# Patient Record
Sex: Male | Born: 1955 | Race: White | Hispanic: No | Marital: Married | Smoking: Former smoker
Health system: Southern US, Community
[De-identification: ages and names within clinical notes are randomized; demographics above are authoritative.]

## PROBLEM LIST (undated history)

## (undated) DIAGNOSIS — G43909 Migraine, unspecified, not intractable, without status migrainosus: Secondary | ICD-10-CM

## (undated) DIAGNOSIS — J449 Chronic obstructive pulmonary disease, unspecified: Secondary | ICD-10-CM

## (undated) DIAGNOSIS — R0600 Dyspnea, unspecified: Secondary | ICD-10-CM

## (undated) DIAGNOSIS — K529 Noninfective gastroenteritis and colitis, unspecified: Secondary | ICD-10-CM

## (undated) DIAGNOSIS — K219 Gastro-esophageal reflux disease without esophagitis: Secondary | ICD-10-CM

## (undated) DIAGNOSIS — M1990 Unspecified osteoarthritis, unspecified site: Secondary | ICD-10-CM

## (undated) DIAGNOSIS — R0601 Orthopnea: Secondary | ICD-10-CM

## (undated) DIAGNOSIS — F419 Anxiety disorder, unspecified: Secondary | ICD-10-CM

## (undated) DIAGNOSIS — K5090 Crohn's disease, unspecified, without complications: Secondary | ICD-10-CM

## (undated) DIAGNOSIS — J302 Other seasonal allergic rhinitis: Secondary | ICD-10-CM

## (undated) DIAGNOSIS — G2581 Restless legs syndrome: Secondary | ICD-10-CM

## (undated) DIAGNOSIS — T753XXA Motion sickness, initial encounter: Secondary | ICD-10-CM

## (undated) DIAGNOSIS — I10 Essential (primary) hypertension: Secondary | ICD-10-CM

## (undated) DIAGNOSIS — K5790 Diverticulosis of intestine, part unspecified, without perforation or abscess without bleeding: Secondary | ICD-10-CM

## (undated) DIAGNOSIS — E7800 Pure hypercholesterolemia, unspecified: Secondary | ICD-10-CM

## (undated) HISTORY — DX: Chronic obstructive pulmonary disease, unspecified: J44.9

## (undated) HISTORY — DX: Migraine, unspecified, not intractable, without status migrainosus: G43.909

## (undated) HISTORY — DX: Anxiety disorder, unspecified: F41.9

## (undated) HISTORY — DX: Essential (primary) hypertension: I10

## (undated) HISTORY — DX: Other seasonal allergic rhinitis: J30.2

---
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
## 1982-03-02 HISTORY — PX: OTHER SURGICAL HISTORY: SHX169

## 2000-03-02 HISTORY — PX: JOINT REPLACEMENT: SHX530

## 2001-03-02 HISTORY — PX: REVISION TOTAL HIP ARTHROPLASTY: SHX766

## 2005-02-06 ENCOUNTER — Ambulatory Visit: Payer: Self-pay | Admitting: Unknown Physician Specialty

## 2005-08-21 ENCOUNTER — Ambulatory Visit: Payer: Self-pay | Admitting: Internal Medicine

## 2005-09-29 ENCOUNTER — Ambulatory Visit: Payer: Self-pay | Admitting: Podiatry

## 2005-10-01 ENCOUNTER — Ambulatory Visit: Payer: Self-pay | Admitting: Podiatry

## 2009-03-06 ENCOUNTER — Ambulatory Visit: Payer: Self-pay | Admitting: Unknown Physician Specialty

## 2009-11-16 ENCOUNTER — Ambulatory Visit: Payer: Self-pay | Admitting: Orthopedic Surgery

## 2009-11-19 ENCOUNTER — Encounter: Admission: RE | Admit: 2009-11-19 | Discharge: 2009-11-19 | Payer: Self-pay | Admitting: Orthopedic Surgery

## 2010-01-09 ENCOUNTER — Ambulatory Visit: Payer: Self-pay

## 2010-01-15 ENCOUNTER — Other Ambulatory Visit: Payer: Self-pay | Admitting: Unknown Physician Specialty

## 2010-02-07 ENCOUNTER — Ambulatory Visit: Payer: Self-pay | Admitting: Unknown Physician Specialty

## 2010-02-11 LAB — PATHOLOGY REPORT

## 2014-03-02 HISTORY — PX: ROTATOR CUFF REPAIR: SHX139

## 2014-05-22 ENCOUNTER — Encounter: Payer: Self-pay | Admitting: Pulmonary Disease

## 2014-05-22 ENCOUNTER — Encounter (INDEPENDENT_AMBULATORY_CARE_PROVIDER_SITE_OTHER): Payer: Self-pay

## 2014-05-22 ENCOUNTER — Ambulatory Visit (INDEPENDENT_AMBULATORY_CARE_PROVIDER_SITE_OTHER): Payer: BLUE CROSS/BLUE SHIELD | Admitting: Pulmonary Disease

## 2014-05-22 VITALS — BP 126/74 | HR 70 | Ht 70.0 in | Wt 322.0 lb

## 2014-05-22 DIAGNOSIS — R0602 Shortness of breath: Secondary | ICD-10-CM | POA: Diagnosis not present

## 2014-05-22 DIAGNOSIS — R5381 Other malaise: Secondary | ICD-10-CM

## 2014-05-22 NOTE — Patient Instructions (Signed)
Take the Advair regularly as we demonstrated in clinic no matter how you feel We will arrange a pulmonary function test We will prescribe a humidifier for your oxygen We will see you back in 4 weeks or sooner if needed.

## 2014-05-22 NOTE — Assessment & Plan Note (Signed)
The potential causes for his shortness of breath are myriad.  His lung exam is normal today but he has an extensive smoking history so the likelihood of COPD is quite high. He does seem to get some benefit from albuterol and he has never consistently been able to use a true controller medication for COPD in the past so that may be why he has remained symptomatic for so long.  However, the likelihood of cardiac disease is fairly high considering his obesity, further obesity ended of itself as well as physical deconditioning are the most likely causes of his shortness of breath at this time.  His recent hemoglobin was 19.9, so he is certainly not anemic.  Plan: -We will treat as if this is COPD by adding Advair to be used twice a day, he was given a sample today -We will obtain full pulmonary function testing -If PFTs are abnormal then he may need to have a CT scan of his chest or neuromuscular testing -We will obtain records from his cardiology office visits with stress tests and an echocardiogram -We will obtain records from his prior pulmonologist -Follow-up in 4-6 weeks

## 2014-05-22 NOTE — Progress Notes (Signed)
Subjective:    Patient ID: Ernest Sims, male    DOB: 04-26-1955, 59 y.o.   MRN: [id number]  HPI Chief Complaint  Patient presents with  . Advice Only    Referred by Dr. Doy Hutching for COPD.  Old Dr. Raul Del pt.  Pt c/o worsening sob with any exertion Xfew years.  Pt wears 2.5L 02 qhs   Mr. Ernest Sims is referred to me by his PCP for difficulty breathing.  He says that he can just walk a few feet and then he will start to get out of breath.  He says that he was never really sure of his diagnosis but he has been prescribed inhalers which he said never really helped.    He has had trouble breathing for many years, even in childhood he remembers.  However he tried to stay active as an adult with softball, bowling, golf etc.  Over the last 20 years though he has been more sedentary and his dyspnea has worsened.  He used to smoke 3ppd after 25 years and quit 1990.   He says that when he gets short of breath he wheezes and feels generally very fatigued.  He says he wonders if he is "getting any oxygen" in.  He thinks that the dyspnea is getting worse over the last few years.  He is currently using proAir which doesn't help much, but does help some.  He has taken symbicort twice but his insurance company didn't pay it.  He took a sample of Symbicort for a while that helped a little. He denies chest pain.  He has never been told that he has heart problems.  He had a stress test 6 months ago that was normal.  He had an echocardiogram which he says was normal.  He has undergone pulmonary function testing as recent as yesterday.  He works in a [employer] and has breathing tests every year.    He has nocturnal hypoxemia and uses O2 at night.  He has been told he doesn't have OSA after a polysomnogram. He doesn't sleep with O2 all night due to sinus dryness.  He has noted increasing weakness (generalized) over the yers.    Past Medical History  Diagnosis Date  . Hypertension   . Migraines   . Anxiety   .  Seasonal allergies   . COPD (chronic obstructive pulmonary disease)      Family History  Problem Relation Age of Onset  . Lung disease Father   . Lung disease Mother   . Heart disease Father   . Cancer Brother     liver  . Cancer Maternal Aunt      History   Social History  . Marital Status: Married    Spouse Name: N/A  . Number of Children: N/A  . Years of Education: N/A   Occupational History  . Not on file.   Social History Main Topics  . Smoking status: Former Smoker -- 3.00 packs/day for 24 years    Types: Cigarettes    Quit date: 05/21/1988  . Smokeless tobacco: Never Used  . Alcohol Use: 0.0 oz/week    0 Standard drinks or equivalent per week     Comment: occasional  . Drug Use: Not on file  . Sexual Activity: Not on file   Other Topics Concern  . Not on file   Social History Narrative  . No narrative on file     Allergies  Allergen Reactions  . Penicillins  Childhood allergy     No outpatient prescriptions prior to visit.   No facility-administered medications prior to visit.       Review of Systems  Constitutional: Negative for fever and unexpected weight change.  HENT: Positive for congestion and postnasal drip. Negative for dental problem, ear pain, nosebleeds, rhinorrhea, sinus pressure, sneezing, sore throat and trouble swallowing.   Eyes: Negative for redness and itching.  Respiratory: Positive for cough, shortness of breath and wheezing. Negative for chest tightness.   Cardiovascular: Negative for palpitations and leg swelling.  Gastrointestinal: Negative for nausea and vomiting.  Genitourinary: Negative for dysuria.  Musculoskeletal: Negative for joint swelling.  Skin: Negative for rash.  Neurological: Negative for headaches.  Hematological: Does not bruise/bleed easily.  Psychiatric/Behavioral: Negative for dysphoric mood. The patient is not nervous/anxious.        Objective:   Physical Exam Filed Vitals:   05/22/14 0934    BP: 126/74  Pulse: 70  Height: 5' 10"  (1.778 m)  Weight: 322 lb (146.058 kg)  SpO2: 99%   RA  Ambulated 500 feet on room air and his O2 saturation was normal.  Gen: Morbidly obese , no acute distress HEENT: NCAT, PERRL, EOMi, OP clear, neck supple without masses PULM: CTA B CV: RRR, no mgr, no JVD AB: BS+, soft, nontender, no hsm Ext: warm, no edema, no clubbing, no cyanosis Derm: no rash or skin breakdown Neuro: A&Ox4, CN II-XII intact, strength 5/5 in all 4 extremities   Records from his primary care doctor's office were reviewed, hemoglobin was 18 in 2016     Assessment & Plan:   Shortness of breath The potential causes for his shortness of breath are myriad.  His lung exam is normal today but he has an extensive smoking history so the likelihood of COPD is quite high. He does seem to get some benefit from albuterol and he has never consistently been able to use a true controller medication for COPD in the past so that may be why he has remained symptomatic for so long.  However, the likelihood of cardiac disease is fairly high considering his obesity, further obesity ended of itself as well as physical deconditioning are the most likely causes of his shortness of breath at this time.  His recent hemoglobin was 33.0, so he is certainly not anemic.  Plan: -We will treat as if this is COPD by adding Advair to be used twice a day, he was given a sample today -We will obtain full pulmonary function testing -If PFTs are abnormal then he may need to have a CT scan of his chest or neuromuscular testing -We will obtain records from his cardiology office visits with stress tests and an echocardiogram -We will obtain records from his prior pulmonologist -Follow-up in 4-6 weeks   Morbid obesity If we are unable to find a clear lung disease then the most likely cause for his dyspnea is his morbid obesity and deconditioning. He was encouraged today to try to exercise  regularly.     Updated Medication List Outpatient Encounter Prescriptions as of 05/22/2014  Medication Sig  . butalbital-acetaminophen-caffeine (FIORICET, ESGIC) 50-325-40 MG per tablet Take 1 tablet by mouth every 4 (four) hours as needed for headache.  . diazepam (VALIUM) 10 MG tablet Take 10 mg by mouth every 6 (six) hours as needed for anxiety.  Marland Kitchen olmesartan (BENICAR) 20 MG tablet Take 20 mg by mouth daily.  . pravastatin (PRAVACHOL) 40 MG tablet Take 40 mg by mouth daily.  Marland Kitchen  pseudoephedrine (SUDAFED) 60 MG tablet Take 60 mg by mouth 2 (two) times daily.  Marland Kitchen triamcinolone (NASACORT) 55 MCG/ACT AERO nasal inhaler Place 2 sprays into the nose 2 (two) times daily.  Marland Kitchen zolpidem (AMBIEN) 10 MG tablet Take 10 mg by mouth at bedtime as needed for sleep.

## 2014-05-22 NOTE — Assessment & Plan Note (Signed)
If we are unable to find a clear lung disease then the most likely cause for his dyspnea is his morbid obesity and deconditioning. He was encouraged today to try to exercise regularly.

## 2014-05-31 ENCOUNTER — Telehealth: Payer: Self-pay | Admitting: Pulmonary Disease

## 2014-05-31 MED ORDER — FLUTICASONE-SALMETEROL 250-50 MCG/DOSE IN AEPB: 1.0000 | INHALATION_SPRAY | Freq: Two times a day (BID) | RESPIRATORY_TRACT | Status: DC

## 2014-05-31 NOTE — Telephone Encounter (Signed)
BQ put pt on Advair 250/50 1 puff BID. This rx has been sent in. Pt is aware. Nothing further was needed.

## 2014-06-18 ENCOUNTER — Institutional Professional Consult (permissible substitution): Payer: Self-pay | Admitting: Pulmonary Disease

## 2014-06-21 ENCOUNTER — Ambulatory Visit: Payer: BLUE CROSS/BLUE SHIELD | Admitting: Pulmonary Disease

## 2014-06-21 ENCOUNTER — Telehealth: Payer: Self-pay | Admitting: *Deleted

## 2014-06-21 DIAGNOSIS — R0602 Shortness of breath: Secondary | ICD-10-CM

## 2014-06-21 NOTE — Telephone Encounter (Signed)
PFT order placed

## 2014-07-12 ENCOUNTER — Ambulatory Visit (INDEPENDENT_AMBULATORY_CARE_PROVIDER_SITE_OTHER): Payer: BLUE CROSS/BLUE SHIELD | Admitting: Pulmonary Disease

## 2014-07-12 ENCOUNTER — Encounter: Payer: Self-pay | Admitting: Pulmonary Disease

## 2014-07-12 VITALS — BP 110/70 | HR 78 | Temp 98.0°F | Ht 70.0 in | Wt 321.0 lb

## 2014-07-12 DIAGNOSIS — R0602 Shortness of breath: Secondary | ICD-10-CM

## 2014-07-12 DIAGNOSIS — K219 Gastro-esophageal reflux disease without esophagitis: Secondary | ICD-10-CM | POA: Diagnosis not present

## 2014-07-12 DIAGNOSIS — R490 Dysphonia: Secondary | ICD-10-CM

## 2014-07-12 LAB — PULMONARY FUNCTION TEST
DL/VA % PRED: 74 %
DL/VA: 3.44 ml/min/mmHg/L
DLCO UNC % PRED: 59 %
DLCO UNC: 19.12 ml/min/mmHg
FEF 25-75 PRE: 2.71 L/s
FEF2575-%PRED-PRE: 93 %
FEV1-%Change-Post: -29 %
FEV1-%PRED-PRE: 79 %
FEV1-%Pred-Post: 56 %
FEV1-POST: 2.01 L
FEV1-PRE: 2.84 L
FEV1FVC-%CHANGE-POST: 0 %
FEV1FVC-%PRED-PRE: 105 %
FEV6-%CHANGE-POST: -28 %
FEV6-%PRED-POST: 56 %
FEV6-%Pred-Pre: 79 %
FEV6-Post: 2.54 L
FEV6-Pre: 3.57 L
FEV6FVC-%Change-Post: 0 %
FEV6FVC-%Pred-Post: 105 %
FEV6FVC-%Pred-Pre: 105 %
FVC-%CHANGE-POST: -28 %
FVC-%PRED-POST: 53 %
FVC-%Pred-Pre: 75 %
FVC-POST: 2.54 L
FVC-Pre: 3.58 L
POST FEV1/FVC RATIO: 79 %
POST FEV6/FVC RATIO: 100 %
PRE FEV1/FVC RATIO: 79 %
Pre FEV6/FVC Ratio: 100 %

## 2014-07-12 NOTE — Assessment & Plan Note (Signed)
This is poorly controlled and likely the explanation for his laryngeal symptoms.  Weight loss encourage Acid reflux lifestyle modifications reviewed Continue twice a day omeprazole

## 2014-07-12 NOTE — Progress Notes (Signed)
PFT performed today. 

## 2014-07-12 NOTE — Progress Notes (Signed)
Subjective:    Patient ID: Ernest Sims, male    DOB: 1955/04/26, 59 y.o.   MRN: [id number]  Synopsis: Former smoker who is referred in 2016 for evaluation of shortness of breath. He is morbidly obese. Lung function testing did not show evidence of COPD. However, there was evidence of extrathoracic airflow obstruction as well as moderate restrictive lung disease with a low ERV.  HPI Chief Complaint  Patient presents with  . Follow-up    Pt here for PFT. Pt is still have sob when walking.   Ernest Sims says that the Advair did not help his shortness of breath. He says that he continues to be short of breath with routine activity. He also feels like there is mucus that is hung up in his throat. He tries to cough it out on a regular basis. He says that he continues to have acid reflux on a daily basis. He sleeps sitting upright. He does not follow acid reflux lifestyle modifications. He takes omeprazole twice a day. He really wants to have shoulder surgery.  Past Medical History  Diagnosis Date  . Hypertension   . Migraines   . Anxiety   . Seasonal allergies   . COPD (chronic obstructive pulmonary disease)       Review of Systems  Constitutional: Negative for fever, chills and fatigue.  HENT: Negative for rhinorrhea and sinus pressure.   Respiratory: Positive for shortness of breath. Negative for cough and wheezing.   Cardiovascular: Negative for chest pain, palpitations and leg swelling.       Objective:   Physical Exam Filed Vitals:   07/12/14 1217  BP: 110/70  Pulse: 78  Temp: 98 F (36.7 C)  Height: 5' 10"  (1.778 m)  Weight: 321 lb (145.605 kg)  SpO2: 94%   RA  Gen: morbidly obese but well appearing HENT: OP clear, TM's clear, neck supple PULM: CTA B, normal percussion CV: RRR, no mgr, trace edema GI: BS+, soft, nontender Derm: no cyanosis or rash Psyche: normal mood and affect  Lung function testing from today personally reviewed> there is no evidence of  intrathoracic airflow obstruction (i.e. COPD) but there is moderate restrictive lung disease with a proportionately decreased DLCO the ERV is only 3% predicted      Assessment & Plan:  @REVDATA @  Shortness of breath Ernest Sims does not have COPD as there is no airflow obstruction on the pulmonary function testing performed today. However he has moderate restriction with a markedly low ERV. I explained to him that the differential diagnosis here includes less likely neuromuscular weakness and more likely morbid obesity. Because of his large weight I feel that this is most likely obesity related. However, his volume loop did show some evidence of extrathoracic airflow obstruction which is likely due to mucus pain up on his vocal cords and laryngeal sensitivity from acid reflux. Because of his smoking history he needs to have this further investigated.  Plan: We will get a CT scan of his chest to ensure that there is no evidence of interstitial lung disease that would explain the restrictive lung disease He was encouraged to lose weight From my standpoint if there is no evidence of interstitial lung disease on the CT chest and it would be okay for him to proceed with orthopedic surgery   Acid reflux This is poorly controlled and likely the explanation for his laryngeal symptoms.  Weight loss encourage Acid reflux lifestyle modifications reviewed Continue twice a day omeprazole  Hoarseness Given his smoking history he needs to be evaluated by an otolaryngologist to make sure there is no evidence of a laryngeal cancer. Considering the evidence of extrathoracic airflow obstruction on the pulmonary function testing we will get a CT scan to evaluate the trachea to ensure there is no evidence of a tracheal mass.     Ernest Sims was seen today for follow-up.  Diagnoses and all orders for this visit:  Shortness of breath Orders: -     Ambulatory referral to ENT -     CT Chest High  Resolution; Future  Gastroesophageal reflux disease, esophagitis presence not specified  Hoarseness   @EXTENDED @

## 2014-07-12 NOTE — Patient Instructions (Signed)
We will order a CT scan of your chest to ensure that there is no evidence of lung disease We will refer you to [place] [facility] for evaluation of the hoarseness If there is evidence of a lung problem on the CT scan we will let you know and schedule a follow-up here, otherwise because we cannot find evidence of a lung disease there is no need for further clinic visits with [place] at this time. Stopped taking Advair

## 2014-07-12 NOTE — Assessment & Plan Note (Signed)
Ernest Sims does not have COPD as there is no airflow obstruction on the pulmonary function testing performed today. However he has moderate restriction with a markedly low ERV. I explained to him that the differential diagnosis here includes less likely neuromuscular weakness and more likely morbid obesity. Because of his large weight I feel that this is most likely obesity related. However, his volume loop did show some evidence of extrathoracic airflow obstruction which is likely due to mucus pain up on his vocal cords and laryngeal sensitivity from acid reflux. Because of his smoking history he needs to have this further investigated.  Plan: We will get a CT scan of his chest to ensure that there is no evidence of interstitial lung disease that would explain the restrictive lung disease He was encouraged to lose weight From my standpoint if there is no evidence of interstitial lung disease on the CT chest and it would be okay for him to proceed with orthopedic surgery

## 2014-07-12 NOTE — Assessment & Plan Note (Signed)
Given his smoking history he needs to be evaluated by an otolaryngologist to make sure there is no evidence of a laryngeal cancer. Considering the evidence of extrathoracic airflow obstruction on the pulmonary function testing we will get a CT scan to evaluate the trachea to ensure there is no evidence of a tracheal mass.

## 2014-07-20 ENCOUNTER — Ambulatory Visit
Admission: RE | Admit: 2014-07-20 | Discharge: 2014-07-20 | Disposition: A | Discharge status: Home / Self Care | Payer: BLUE CROSS/BLUE SHIELD | Source: Ambulatory Visit | Attending: Pulmonary Disease | Admitting: Pulmonary Disease

## 2014-07-20 DIAGNOSIS — R0602 Shortness of breath: Secondary | ICD-10-CM | POA: Diagnosis present

## 2014-07-20 DIAGNOSIS — J849 Interstitial pulmonary disease, unspecified: Secondary | ICD-10-CM | POA: Insufficient documentation

## 2014-07-20 DIAGNOSIS — R918 Other nonspecific abnormal finding of lung field: Secondary | ICD-10-CM | POA: Insufficient documentation

## 2014-07-20 DIAGNOSIS — I2510 Atherosclerotic heart disease of native coronary artery without angina pectoris: Secondary | ICD-10-CM | POA: Insufficient documentation

## 2014-07-20 DIAGNOSIS — J432 Centrilobular emphysema: Secondary | ICD-10-CM | POA: Insufficient documentation

## 2014-07-24 ENCOUNTER — Other Ambulatory Visit: Payer: Self-pay | Admitting: Pulmonary Disease

## 2014-07-24 DIAGNOSIS — J849 Interstitial pulmonary disease, unspecified: Secondary | ICD-10-CM

## 2014-07-25 NOTE — Progress Notes (Signed)
Quick Note:  Spoke with pt and notified of results per Dr. McQuaid. Pt verbalized understanding and denied any questions.  ______ 

## 2014-12-05 ENCOUNTER — Emergency Department
Admission: EM | Admit: 2014-12-05 | Discharge: 2014-12-05 | Disposition: A | Discharge status: Home / Self Care | Payer: BLUE CROSS/BLUE SHIELD | Attending: Emergency Medicine | Admitting: Emergency Medicine

## 2014-12-05 DIAGNOSIS — I10 Essential (primary) hypertension: Secondary | ICD-10-CM | POA: Diagnosis not present

## 2014-12-05 DIAGNOSIS — Z87891 Personal history of nicotine dependence: Secondary | ICD-10-CM | POA: Insufficient documentation

## 2014-12-05 DIAGNOSIS — Z7951 Long term (current) use of inhaled steroids: Secondary | ICD-10-CM | POA: Diagnosis not present

## 2014-12-05 DIAGNOSIS — Z79899 Other long term (current) drug therapy: Secondary | ICD-10-CM | POA: Diagnosis not present

## 2014-12-05 DIAGNOSIS — Z79891 Long term (current) use of opiate analgesic: Secondary | ICD-10-CM | POA: Insufficient documentation

## 2014-12-05 DIAGNOSIS — R319 Hematuria, unspecified: Secondary | ICD-10-CM | POA: Diagnosis not present

## 2014-12-05 DIAGNOSIS — Z880 Allergy status to penicillin: Secondary | ICD-10-CM | POA: Diagnosis not present

## 2014-12-05 LAB — CBC
HEMATOCRIT: 40.9 % (ref 40.0–52.0)
Hemoglobin: 13.8 g/dL (ref 13.0–18.0)
MCH: 30.8 pg (ref 26.0–34.0)
MCHC: 33.7 g/dL (ref 32.0–36.0)
MCV: 91.5 fL (ref 80.0–100.0)
Platelets: 206 10*3/uL (ref 150–440)
RBC: 4.47 MIL/uL (ref 4.40–5.90)
RDW: 14.5 % (ref 11.5–14.5)
WBC: 10 10*3/uL (ref 3.8–10.6)

## 2014-12-05 LAB — URINALYSIS COMPLETE WITH MICROSCOPIC (ARMC ONLY)
Bacteria, UA: NONE SEEN
Bilirubin Urine: UNDETERMINED
Hgb urine dipstick: UNDETERMINED
KETONES UR: UNDETERMINED mg/dL
Leukocytes, UA: UNDETERMINED
Nitrite: UNDETERMINED
Protein, ur: UNDETERMINED mg/dL
SPECIFIC GRAVITY, URINE: 1.025 (ref 1.005–1.030)
Squamous Epithelial / LPF: NONE SEEN
WBC, UA: NONE SEEN WBC/hpf (ref 0–5)
pH: UNDETERMINED (ref 5.0–8.0)

## 2014-12-05 LAB — BASIC METABOLIC PANEL
ANION GAP: 9 (ref 5–15)
BUN: 24 mg/dL — ABNORMAL HIGH (ref 6–20)
CALCIUM: 9.2 mg/dL (ref 8.9–10.3)
CHLORIDE: 107 mmol/L (ref 101–111)
CO2: 24 mmol/L (ref 22–32)
CREATININE: 1.06 mg/dL (ref 0.61–1.24)
GFR calc Af Amer: >60 mL/min (ref >60)
GFR calc non Af Amer: >60 mL/min (ref >60)
GLUCOSE: 116 mg/dL — AB (ref 65–99)
Potassium: 4.9 mmol/L (ref 3.5–5.1)
Sodium: 140 mmol/L (ref 135–145)

## 2014-12-05 MED ORDER — OXYCODONE-ACETAMINOPHEN 5-325 MG PO TABS
1.0000 | ORAL_TABLET | Freq: Once | ORAL | Status: AC
  Administered 2014-12-05: 1 via ORAL
  Filled 2014-12-05: qty 1

## 2014-12-05 NOTE — Discharge Instructions (Signed)
Please seek medical attention for any high fevers, chest pain, shortness of breath, change in behavior, persistent vomiting, bloody stool or any other new or concerning symptoms.   Hematuria, Adult Hematuria is blood in your urine. It can be caused by a bladder infection, kidney infection, prostate infection, kidney stone, or cancer of your urinary tract. Infections can usually be treated with medicine, and a kidney stone usually will pass through your urine. If neither of these is the cause of your hematuria, further workup to find out the reason may be needed. It is very important that you tell your health care provider about any blood you see in your urine, even if the blood stops without treatment or happens without causing pain. Blood in your urine that happens and then stops and then happens again can be a symptom of a very serious condition. Also, pain is not a symptom in the initial stages of many urinary cancers. HOME CARE INSTRUCTIONS   Drink lots of fluid, 3-4 quarts a day. If you have been diagnosed with an infection, cranberry juice is especially recommended, in addition to large amounts of water.  Avoid caffeine, tea, and carbonated beverages because they tend to irritate the bladder.  Avoid alcohol because it may irritate the prostate.  Take all medicines as directed by your health care provider.  If you were prescribed an antibiotic medicine, finish it all even if you start to feel better.  If you have been diagnosed with a kidney stone, follow your health care provider's instructions regarding straining your urine to catch the stone.  Empty your bladder often. Avoid holding urine for long periods of time.  After a bowel movement, women should cleanse front to back. Use each tissue only once.  Empty your bladder before and after sexual intercourse if you are a male. SEEK MEDICAL CARE IF:  You develop back pain.  You have a fever.  You have a feeling of sickness in  your stomach (nausea) or vomiting.  Your symptoms are not better in 3 days. Return sooner if you are getting worse. SEEK IMMEDIATE MEDICAL CARE IF:   You develop severe vomiting and are unable to keep the medicine down.  You develop severe back or abdominal pain despite taking your medicines.  You begin passing a large amount of blood or clots in your urine.  You feel extremely weak or faint, or you pass out. MAKE SURE YOU:   Understand these instructions.  Will watch your condition.  Will get help right away if you are not doing well or get worse.   This information is not intended to replace advice given to you by your health care provider. Make sure you discuss any questions you have with your health care provider.   Document Released: 02/16/2005 Document Revised: 03/09/2014 Document Reviewed: 10/17/2012 Elsevier Interactive Patient Education [employer].

## 2014-12-05 NOTE — ED Notes (Signed)
Pt in with co hematuria that started 3 hrs ago, no hx of the same.  States has painful urination.

## 2014-12-05 NOTE — ED Provider Notes (Signed)
[facility] Emergency Department Provider Note    ____________________________________________  Time seen: 0415  I have reviewed the triage vital signs and the nursing notes.   HISTORY  Chief Complaint Hematuria   History limited by: Not Limited   HPI Ernest Sims is a 59 y.o. male who presents to the emergency department today with concern for hematuria. The patient states that he first started noticing blood in his urine roughly 3 hours ago. He did have multiple bloody urinations fairly quickly. He states that the frequency of his bloody urinations have decreased and his last one was roughly 1-1/2 hours prior to my evaluation. The patient states he has had some pain associated with this. Denies any recent fevers. Denies any hematuria in the past.   Past Medical History  Diagnosis Date  . Hypertension   . Migraines   . Anxiety   . Seasonal allergies   . COPD (chronic obstructive pulmonary disease)     Patient Active Problem List   Diagnosis Date Noted  . Acid reflux 07/12/2014  . Hoarseness 07/12/2014  . Shortness of breath 05/22/2014  . Morbid obesity ([place]) 05/22/2014  . Physical deconditioning 05/22/2014    Past Surgical History  Procedure Laterality Date  . Ruptured disc  1984  . Revision total hip arthroplasty  2003    Current Outpatient Rx  Name  Route  Sig  Dispense  Refill  . butalbital-acetaminophen-caffeine (FIORICET, ESGIC) 50-325-40 MG per tablet   Oral   Take 1 tablet by mouth every 4 (four) hours as needed for headache.         . diazepam (VALIUM) 10 MG tablet   Oral   Take 10 mg by mouth every 6 (six) hours as needed for anxiety.         Marland Kitchen olmesartan (BENICAR) 20 MG tablet   Oral   Take 20 mg by mouth daily.         Marland Kitchen omeprazole (PRILOSEC) 40 MG capsule   Oral   Take 40 mg by mouth daily.         Marland Kitchen oxyCODONE (OXY IR/ROXICODONE) 5 MG immediate release tablet   Oral   Take 1-2 tablets by mouth every 4  (four) hours.      0   . pravastatin (PRAVACHOL) 40 MG tablet   Oral   Take 40 mg by mouth daily.         . promethazine (PHENERGAN) 25 MG tablet   Oral   Take 1 tablet by mouth every 6 (six) hours as needed.      0   . propranolol (INDERAL) 40 MG tablet   Oral   Take 40 mg by mouth 2 (two) times daily.         Marland Kitchen triamcinolone (NASACORT) 55 MCG/ACT AERO nasal inhaler   Nasal   Place 2 sprays into the nose 2 (two) times daily as needed.          Marland Kitchen allopurinol (ZYLOPRIM) 300 MG tablet   Oral   Take 600 mg by mouth as needed.          . budesonide-formoterol (SYMBICORT) 160-4.5 MCG/ACT inhaler   Inhalation   Inhale 2 puffs into the lungs 2 (two) times daily.         . pseudoephedrine (SUDAFED) 60 MG tablet   Oral   Take 60 mg by mouth 2 (two) times daily.         Marland Kitchen zolpidem (AMBIEN) 10 MG tablet  Oral   Take 10 mg by mouth at bedtime as needed for sleep.           Allergies Aspirin; Etodolac; Penicillin g; Penicillin v potassium; and Penicillins  Family History  Problem Relation Age of Onset  . Lung disease Father   . Lung disease Mother   . Heart disease Father   . Cancer Brother     liver  . Cancer Maternal Aunt     Social History Social History  Substance Use Topics  . Smoking status: Former Smoker -- 3.00 packs/day for 24 years    Types: Cigarettes    Quit date: 05/21/1988  . Smokeless tobacco: Never Used  . Alcohol Use: 0.0 oz/week    0 Standard drinks or equivalent per week     Comment: occasional    Review of Systems  Constitutional: Negative for fever. Cardiovascular: Negative for chest pain. Respiratory: Negative for shortness of breath. Gastrointestinal: Negative for abdominal pain, vomiting and diarrhea. Genitourinary: Positive for hematuria Musculoskeletal: Negative for back pain.  Skin: Negative for rash. Neurological: Negative for headaches, focal weakness or numbness.  10-point ROS otherwise  negative.  ____________________________________________   PHYSICAL EXAM:  VITAL SIGNS: ED Triage Vitals  Enc Vitals Group     BP 12/05/14 0020 141/58 mmHg     Pulse Rate 12/05/14 0019 100     Resp 12/05/14 0019 18     Temp 12/05/14 0019 98.5 F (36.9 C)     Temp Source 12/05/14 0019 Oral     SpO2 12/05/14 0019 93 %     Weight 12/05/14 0019 300 lb (136.079 kg)     Height 12/05/14 0019 5' 10"  (1.778 m)     Head Cir --      Peak Flow --      Pain Score 12/05/14 0019 7   Constitutional: Alert and oriented. Well appearing and in no distress. Eyes: Conjunctivae are normal. PERRL. Normal extraocular movements. ENT   Head: Normocephalic and atraumatic.   Nose: No congestion/rhinnorhea.   Mouth/Throat: Mucous membranes are moist.   Neck: No stridor. Hematological/Lymphatic/Immunilogical: No cervical lymphadenopathy. Cardiovascular: Normal rate, regular rhythm.  No murmurs, rubs, or gallops. Respiratory: Normal respiratory effort without tachypnea nor retractions. Breath sounds are clear and equal bilaterally. No wheezes/rales/rhonchi. Gastrointestinal: Soft and nontender. No distention.  Musculoskeletal: Normal range of motion in all extremities. No joint effusions.   Neurologic:  Normal speech and language. No gross focal neurologic deficits are appreciated. Speech is normal.  Skin:  Skin is warm, dry and intact. No rash noted. Psychiatric: Mood and affect are normal. Speech and behavior are normal. Patient exhibits appropriate insight and judgment.  ____________________________________________    LABS (pertinent positives/negatives)  Labs Reviewed  URINALYSIS COMPLETEWITH MICROSCOPIC ([place]) - Abnormal; Notable for the following:    Color, Urine RED (*)    APPearance CLOUDY (*)    All other components within normal limits  BASIC METABOLIC PANEL - Abnormal; Notable for the following:    Glucose, Bld 116 (*)    BUN 24 (*)    All other components within  normal limits  CBC     ____________________________________________   EKG  None  ____________________________________________    RADIOLOGY  None   ____________________________________________   PROCEDURES  Procedure(s) performed: None  Critical Care performed: No  ____________________________________________   INITIAL IMPRESSION / ASSESSMENT AND PLAN / ED COURSE  Pertinent labs & imaging results that were available during my care of the patient were reviewed by me  and considered in my medical decision making (see chart for details).  Patient presents to the emergency department today with concerns for hematuria. Patient afebrile without any leukocytosis or white blood cells in the urine thus I doubt infection. Additionally patient not anemic and vital signs are stable. Will have patient follow-up with urology. Discussed retention precautions.  ____________________________________________   FINAL CLINICAL IMPRESSION(S) / ED DIAGNOSES  Final diagnoses:  Hematuria     Nance Pear, MD 12/05/14 8887

## 2015-02-27 ENCOUNTER — Encounter: Payer: Self-pay | Admitting: *Deleted

## 2015-02-28 ENCOUNTER — Ambulatory Visit: Payer: BLUE CROSS/BLUE SHIELD | Admitting: Anesthesiology

## 2015-02-28 ENCOUNTER — Encounter
Admission: RE | Disposition: A | Discharge status: Home / Self Care | Payer: Self-pay | Source: Ambulatory Visit | Attending: Unknown Physician Specialty

## 2015-02-28 ENCOUNTER — Ambulatory Visit
Admission: RE | Admit: 2015-02-28 | Discharge: 2015-02-28 | Disposition: A | Discharge status: Home / Self Care | Payer: BLUE CROSS/BLUE SHIELD | Source: Ambulatory Visit | Attending: Unknown Physician Specialty | Admitting: Unknown Physician Specialty

## 2015-02-28 ENCOUNTER — Encounter: Payer: Self-pay | Admitting: Anesthesiology

## 2015-02-28 DIAGNOSIS — M1711 Unilateral primary osteoarthritis, right knee: Secondary | ICD-10-CM | POA: Diagnosis not present

## 2015-02-28 DIAGNOSIS — Z886 Allergy status to analgesic agent status: Secondary | ICD-10-CM | POA: Diagnosis not present

## 2015-02-28 DIAGNOSIS — R0602 Shortness of breath: Secondary | ICD-10-CM | POA: Insufficient documentation

## 2015-02-28 DIAGNOSIS — K529 Noninfective gastroenteritis and colitis, unspecified: Secondary | ICD-10-CM | POA: Diagnosis not present

## 2015-02-28 DIAGNOSIS — F419 Anxiety disorder, unspecified: Secondary | ICD-10-CM | POA: Diagnosis not present

## 2015-02-28 DIAGNOSIS — G4734 Idiopathic sleep related nonobstructive alveolar hypoventilation: Secondary | ICD-10-CM | POA: Insufficient documentation

## 2015-02-28 DIAGNOSIS — Z880 Allergy status to penicillin: Secondary | ICD-10-CM | POA: Diagnosis not present

## 2015-02-28 DIAGNOSIS — E291 Testicular hypofunction: Secondary | ICD-10-CM | POA: Insufficient documentation

## 2015-02-28 DIAGNOSIS — J449 Chronic obstructive pulmonary disease, unspecified: Secondary | ICD-10-CM | POA: Diagnosis not present

## 2015-02-28 DIAGNOSIS — K640 First degree hemorrhoids: Secondary | ICD-10-CM | POA: Insufficient documentation

## 2015-02-28 DIAGNOSIS — I10 Essential (primary) hypertension: Secondary | ICD-10-CM | POA: Insufficient documentation

## 2015-02-28 DIAGNOSIS — Z87891 Personal history of nicotine dependence: Secondary | ICD-10-CM | POA: Diagnosis not present

## 2015-02-28 DIAGNOSIS — D72822 Plasmacytosis: Secondary | ICD-10-CM | POA: Insufficient documentation

## 2015-02-28 DIAGNOSIS — I878 Other specified disorders of veins: Secondary | ICD-10-CM | POA: Diagnosis not present

## 2015-02-28 DIAGNOSIS — E785 Hyperlipidemia, unspecified: Secondary | ICD-10-CM | POA: Insufficient documentation

## 2015-02-28 DIAGNOSIS — F329 Major depressive disorder, single episode, unspecified: Secondary | ICD-10-CM | POA: Diagnosis not present

## 2015-02-28 DIAGNOSIS — Z6841 Body Mass Index (BMI) 40.0 and over, adult: Secondary | ICD-10-CM | POA: Diagnosis not present

## 2015-02-28 DIAGNOSIS — Z833 Family history of diabetes mellitus: Secondary | ICD-10-CM | POA: Diagnosis not present

## 2015-02-28 DIAGNOSIS — G43909 Migraine, unspecified, not intractable, without status migrainosus: Secondary | ICD-10-CM | POA: Insufficient documentation

## 2015-02-28 DIAGNOSIS — Z7951 Long term (current) use of inhaled steroids: Secondary | ICD-10-CM | POA: Diagnosis not present

## 2015-02-28 DIAGNOSIS — K633 Ulcer of intestine: Secondary | ICD-10-CM | POA: Insufficient documentation

## 2015-02-28 DIAGNOSIS — Z96649 Presence of unspecified artificial hip joint: Secondary | ICD-10-CM | POA: Insufficient documentation

## 2015-02-28 DIAGNOSIS — K210 Gastro-esophageal reflux disease with esophagitis: Secondary | ICD-10-CM | POA: Insufficient documentation

## 2015-02-28 DIAGNOSIS — Z79899 Other long term (current) drug therapy: Secondary | ICD-10-CM | POA: Insufficient documentation

## 2015-02-28 DIAGNOSIS — M479 Spondylosis, unspecified: Secondary | ICD-10-CM | POA: Diagnosis not present

## 2015-02-28 DIAGNOSIS — H409 Unspecified glaucoma: Secondary | ICD-10-CM | POA: Diagnosis not present

## 2015-02-28 DIAGNOSIS — M659 Synovitis and tenosynovitis, unspecified: Secondary | ICD-10-CM | POA: Diagnosis not present

## 2015-02-28 DIAGNOSIS — K598 Other specified functional intestinal disorders: Secondary | ICD-10-CM | POA: Insufficient documentation

## 2015-02-28 DIAGNOSIS — Z8249 Family history of ischemic heart disease and other diseases of the circulatory system: Secondary | ICD-10-CM | POA: Insufficient documentation

## 2015-02-28 DIAGNOSIS — E669 Obesity, unspecified: Secondary | ICD-10-CM | POA: Diagnosis not present

## 2015-02-28 DIAGNOSIS — M109 Gout, unspecified: Secondary | ICD-10-CM | POA: Insufficient documentation

## 2015-02-28 DIAGNOSIS — K5090 Crohn's disease, unspecified, without complications: Secondary | ICD-10-CM | POA: Insufficient documentation

## 2015-02-28 HISTORY — PX: COLONOSCOPY WITH PROPOFOL: SHX5780

## 2015-02-28 HISTORY — PX: ESOPHAGOGASTRODUODENOSCOPY (EGD) WITH PROPOFOL: SHX5813

## 2015-02-28 SURGERY — COLONOSCOPY WITH PROPOFOL
Anesthesia: General

## 2015-02-28 MED ORDER — IPRATROPIUM-ALBUTEROL 0.5-2.5 (3) MG/3ML IN SOLN
RESPIRATORY_TRACT | Status: AC
  Filled 2015-02-28: qty 3

## 2015-02-28 MED ORDER — MIDAZOLAM HCL 2 MG/2ML IJ SOLN
INTRAMUSCULAR | Status: DC | PRN
  Administered 2015-02-28: 1 mg via INTRAVENOUS

## 2015-02-28 MED ORDER — SODIUM CHLORIDE 0.9 % IV SOLN
INTRAVENOUS | Status: DC
  Administered 2015-02-28: 1000 mL via INTRAVENOUS

## 2015-02-28 MED ORDER — PROPOFOL 500 MG/50ML IV EMUL
INTRAVENOUS | Status: DC | PRN
  Administered 2015-02-28: 140 ug/kg/min via INTRAVENOUS

## 2015-02-28 MED ORDER — FENTANYL CITRATE (PF) 100 MCG/2ML IJ SOLN
INTRAMUSCULAR | Status: DC | PRN
  Administered 2015-02-28: 50 ug via INTRAVENOUS

## 2015-02-28 MED ORDER — SODIUM CHLORIDE 0.9 % IV SOLN: INTRAVENOUS | Status: DC

## 2015-02-28 MED ORDER — IPRATROPIUM-ALBUTEROL 0.5-2.5 (3) MG/3ML IN SOLN
3.0000 mL | Freq: Once | RESPIRATORY_TRACT | Status: AC
  Administered 2015-02-28: 3 mL via RESPIRATORY_TRACT

## 2015-02-28 MED ORDER — LIDOCAINE HCL (CARDIAC) 20 MG/ML IV SOLN
INTRAVENOUS | Status: DC | PRN
  Administered 2015-02-28: 60 mg via INTRAVENOUS

## 2015-02-28 MED ORDER — EPHEDRINE SULFATE 50 MG/ML IJ SOLN
INTRAMUSCULAR | Status: DC | PRN
  Administered 2015-02-28: 5 mg via INTRAVENOUS

## 2015-02-28 MED ORDER — GLYCOPYRROLATE 0.2 MG/ML IJ SOLN
INTRAMUSCULAR | Status: DC | PRN
  Administered 2015-02-28: 0.1 mg via INTRAVENOUS

## 2015-02-28 NOTE — Anesthesia Procedure Notes (Signed)
Performed by: Vaughan Sine Pre-anesthesia Checklist: Patient identified, Emergency Drugs available, Suction available, Patient being monitored and Timeout performed Patient Re-evaluated:Patient Re-evaluated prior to inductionOxygen Delivery Method: Simple face mask Preoxygenation: Pre-oxygenation with 100% oxygen Intubation Type: IV induction Placement Confirmation: positive ETCO2 and CO2 detector

## 2015-02-28 NOTE — Anesthesia Preprocedure Evaluation (Signed)
Anesthesia Evaluation  Patient identified by MRN, date of birth, ID band Patient awake    Reviewed: Allergy & Precautions, H&P , NPO status , Patient's Chart, lab work & pertinent test results  History of Anesthesia Complications Negative for: history of anesthetic complications  Airway Mallampati: III  TM Distance: >3 FB Neck ROM: limited    Dental no notable dental hx. (+) Teeth Intact   Pulmonary shortness of breath, COPD, former smoker,    Pulmonary exam normal breath sounds clear to auscultation       Cardiovascular Exercise Tolerance: Poor hypertension, (-) angina+ DOE  (-) Past MI Normal cardiovascular exam Rhythm:regular Rate:Normal     Neuro/Psych  Headaches, PSYCHIATRIC DISORDERS Anxiety    GI/Hepatic Neg liver ROS, GERD  Controlled,  Endo/Other  negative endocrine ROS  Renal/GU negative Renal ROS  negative genitourinary   Musculoskeletal   Abdominal   Peds  Hematology negative hematology ROS (+)   Anesthesia Other Findings Past Medical History:   Hypertension                                                 Migraines                                                    Anxiety                                                      Seasonal allergies                                           COPD (chronic obstructive pulmonary disease) (*             Past Surgical History:   ruptured disc                                    1984         REVISION TOTAL HIP ARTHROPLASTY                  2003        BMI    Body Mass Index   45.19 kg/m 2      Reproductive/Obstetrics negative OB ROS                             Anesthesia Physical Anesthesia Plan  ASA: IV  Anesthesia Plan: General   Post-op Pain Management:    Induction:   Airway Management Planned:   Additional Equipment:   Intra-op Plan:   Post-operative Plan:   Informed Consent: I have reviewed the patients  History and Physical, chart, labs and discussed the procedure including the risks, benefits and alternatives for the proposed anesthesia with the patient or authorized representative who has indicated his/her understanding and acceptance.   Dental Advisory Given  Plan Discussed with: Anesthesiologist, CRNA and Surgeon  Anesthesia Plan Comments:         Anesthesia Quick Evaluation

## 2015-02-28 NOTE — Op Note (Signed)
[facility] Gastroenterology Patient Name: Ernest Sims Procedure Date: 02/28/2015 2:28 PM MRN: [id number] Account #: [id number] Date of Birth: [birth year] Admit Type: Outpatient Age: 59 Room: [facility] ENDO ROOM 1 Gender: Male Note Status: Finalized Procedure:         Colonoscopy Indications:       High risk colon cancer surveillance: Crohn's disease large                     intestine Providers:         Manya Silvas, MD Referring MD:      Leonie Douglas. Doy Hutching, MD (Referring MD) Medicines:         Propofol per Anesthesia Complications:     No immediate complications. Procedure:         Pre-Anesthesia Assessment:                    - After reviewing the risks and benefits, the patient was                     deemed in satisfactory condition to undergo the procedure.                    After obtaining informed consent, the colonoscope was                     passed under direct vision. Throughout the procedure, the                     patient's blood pressure, pulse, and oxygen saturations                     were monitored continuously. The Colonoscope was                     introduced through the anus and advanced to the the cecum,                     identified by appendiceal orifice and ileocecal valve. The                     colonoscopy was performed without difficulty. The patient                     tolerated the procedure well. The quality of the bowel                     preparation was good. Findings:      Internal hemorrhoids were found during endoscopy. The hemorrhoids were       medium-sized, large and Grade I (internal hemorrhoids that do not       prolapse).      A few two mm ulcers were found in the distal descending colon. No       bleeding was present. No stigmata of recent bleeding were seen. Biopsies       were taken with a cold forceps for histology.      The terminal ileum appeared normal.      The colon was otherwise normal Impression:         - Internal hemorrhoids.                    - A few ulcers in the distal descending colon. Biopsied.                    -  The examined portion of the ileum was normal. Recommendation:    - Await pathology results. Manya Silvas, MD 02/28/2015 2:59:53 PM This report has been signed electronically. Number of Addenda: 0 Note Initiated On: 02/28/2015 2:28 PM Scope Withdrawal Time: 0 hours 7 minutes 4 seconds  Total Procedure Duration: 0 hours 10 minutes 10 seconds       [facility]

## 2015-02-28 NOTE — Transfer of Care (Signed)
Immediate Anesthesia Transfer of Care Note  Patient: Ernest Sims  Procedure(s) Performed: Procedure(s): COLONOSCOPY WITH PROPOFOL (N/A) ESOPHAGOGASTRODUODENOSCOPY (EGD) WITH PROPOFOL (N/A)  Patient Location: PACU  Anesthesia Type:General  Level of Consciousness: awake, alert , oriented and sedated  Airway & Oxygen Therapy: Patient Spontanous Breathing and Patient connected to face mask oxygen  Post-op Assessment: Report given to RN and Post -op Vital signs reviewed and stable  Post vital signs: Reviewed and stable  Last Vitals:  Filed Vitals:   02/28/15 1357  BP: 128/63  Pulse: 84  Temp: 36.9 C  Resp: 20    Complications: No apparent anesthesia complications

## 2015-02-28 NOTE — H&P (Signed)
Primary Care Physician:  Idelle Crouch, MD Primary Gastroenterologist:  Dr. Vira Agar  Pre-Procedure History & Physical: HPI:  Ernest Sims is a 59 y.o. male is here for an endoscopy and colonoscopy.   Past Medical History  Diagnosis Date  . Hypertension   . Migraines   . Anxiety   . Seasonal allergies   . COPD (chronic obstructive pulmonary disease) [facility])     Past Surgical History  Procedure Laterality Date  . Ruptured disc  1984  . Revision total hip arthroplasty  2003    Prior to Admission medications   Medication Sig Start Date End Date Taking? Authorizing Provider  butalbital-acetaminophen-caffeine (FIORICET, ESGIC) 50-325-40 MG per tablet Take 1 tablet by mouth every 4 (four) hours as needed for headache.   Yes Historical Provider, MD  olmesartan (BENICAR) 20 MG tablet Take 20 mg by mouth daily.   Yes Historical Provider, MD  omeprazole (PRILOSEC) 40 MG capsule Take 40 mg by mouth daily. 05/09/14  Yes Historical Provider, MD  pravastatin (PRAVACHOL) 40 MG tablet Take 40 mg by mouth daily.   Yes Historical Provider, MD  pseudoephedrine (SUDAFED) 60 MG tablet Take 60 mg by mouth 2 (two) times daily.   Yes Historical Provider, MD  allopurinol (ZYLOPRIM) 300 MG tablet Take 600 mg by mouth as needed.     Historical Provider, MD  budesonide-formoterol (SYMBICORT) 160-4.5 MCG/ACT inhaler Inhale 2 puffs into the lungs 2 (two) times daily. 08/18/13   Historical Provider, MD  diazepam (VALIUM) 10 MG tablet Take 10 mg by mouth every 6 (six) hours as needed for anxiety.    Historical Provider, MD  oxyCODONE (OXY IR/ROXICODONE) 5 MG immediate release tablet Take 1-2 tablets by mouth every 4 (four) hours. 11/30/14   Historical Provider, MD  promethazine (PHENERGAN) 25 MG tablet Take 1 tablet by mouth every 6 (six) hours as needed. 11/13/14   Historical Provider, MD  propranolol (INDERAL) 40 MG tablet Take 40 mg by mouth 2 (two) times daily. 11/20/13   Historical Provider, MD   triamcinolone (NASACORT) 55 MCG/ACT AERO nasal inhaler Place 2 sprays into the nose 2 (two) times daily as needed.     Historical Provider, MD  zolpidem (AMBIEN) 10 MG tablet Take 10 mg by mouth at bedtime as needed for sleep.    Historical Provider, MD    Allergies as of 02/07/2015 - Review Complete 12/05/2014  Allergen Reaction Noted  . Aspirin Nausea And Vomiting 07/12/2014  . Etodolac Nausea And Vomiting 07/12/2014  . Penicillin g Other (See Comments) 07/12/2014  . Penicillin v potassium Swelling 07/12/2014  . Penicillins  05/22/2014    Family History  Problem Relation Age of Onset  . Lung disease Father   . Lung disease Mother   . Heart disease Father   . Cancer Brother     liver  . Cancer Maternal Aunt     Social History   Social History  . Marital Status: Married    Spouse Name: N/A  . Number of Children: N/A  . Years of Education: N/A   Occupational History  . Not on file.   Social History Main Topics  . Smoking status: Former Smoker -- 3.00 packs/day for 24 years    Types: Cigarettes    Quit date: 05/21/1988  . Smokeless tobacco: Never Used  . Alcohol Use: 0.0 oz/week    0 Standard drinks or equivalent per week     Comment: occasional  . Drug Use: Not on file  .  Sexual Activity: Not on file   Other Topics Concern  . Not on file   Social History Narrative    Review of Systems: See HPI, otherwise negative ROS  Physical Exam: BP 128/63 mmHg  Pulse 84  Temp(Src) 98.5 F (36.9 C) (Oral)  Resp 20  Ht 5' 10"  (1.778 m)  Wt 142.883 kg (315 lb)  BMI 45.20 kg/m2  SpO2 97% General:   Alert,  pleasant and cooperative in NAD Head:  Normocephalic and atraumatic. Neck:  Supple; no masses or thyromegaly. Lungs:  Clear throughout to auscultation.    Heart:  Regular rate and rhythm. Abdomen:  Soft, nontender and nondistended. Normal bowel sounds, without guarding, and without rebound.  Very obese, overweight. Neurologic:  Alert and  oriented x4;  grossly  normal neurologically.  Impression/Plan: DONTELL MIAN is here for an endoscopy and colonoscopy to be performed for Crohn's disease and GERD  Risks, benefits, limitations, and alternatives regarding  endoscopy and colonoscopy have been reviewed with the patient.  Questions have been answered.  All parties agreeable.   Gaylyn Cheers, MD  02/28/2015, 2:28 PM

## 2015-02-28 NOTE — Op Note (Signed)
[facility] Gastroenterology Patient Name: Ernest Sims Procedure Date: 02/28/2015 2:29 PM MRN: [id number] Account #: [id number] Date of Birth: 1955-12-18 Admit Type: Outpatient Age: 59 Room: [facility] ENDO ROOM 1 Gender: Male Note Status: Finalized Procedure:         Upper GI endoscopy Indications:       Heartburn Providers:         Manya Silvas, MD Referring MD:      Leonie Douglas. Doy Hutching, MD (Referring MD) Medicines:         Propofol per Anesthesia Complications:     No immediate complications. Procedure:         Pre-Anesthesia Assessment:                    - After reviewing the risks and benefits, the patient was                     deemed in satisfactory condition to undergo the procedure.                    After obtaining informed consent, the endoscope was passed                     under direct vision. Throughout the procedure, the                     patient's blood pressure, pulse, and oxygen saturations                     were monitored continuously. The Endoscope was introduced                     through the mouth, and advanced to the duodenal bulb. The                     upper GI endoscopy was accomplished without difficulty.                     The patient tolerated the procedure well. Findings:      LA Grade A (one or more mucosal breaks less than 5 mm, not extending       between tops of 2 mucosal folds) esophagitis with no bleeding was found       40 cm from the incisors. Bx done at [facility].      The stomach was normal.      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 Impression:        - LA Grade A reflux esophagitis. Rule out Barrett's                     esophagus.                    - Normal stomach.                    - Duodenal deformity.                    - No specimens collected. Recommendation:    - Await pathology results. Manya Silvas, MD 02/28/2015 2:47:06 PM This report  has been signed electronically. Number of Addenda: 0 Note Initiated On: 02/28/2015 2:29 PM      [facility]

## 2015-03-01 NOTE — Anesthesia Postprocedure Evaluation (Signed)
Anesthesia Post Note  Patient: Ernest Sims  Procedure(s) Performed: Procedure(s) (LRB): COLONOSCOPY WITH PROPOFOL (N/A) ESOPHAGOGASTRODUODENOSCOPY (EGD) WITH PROPOFOL (N/A)  Patient location during evaluation: Endoscopy Anesthesia Type: General Level of consciousness: awake and alert Pain management: pain level controlled Vital Signs Assessment: post-procedure vital signs reviewed and stable Respiratory status: spontaneous breathing, nonlabored ventilation, respiratory function stable and patient connected to nasal cannula oxygen Cardiovascular status: blood pressure returned to baseline and stable Postop Assessment: no signs of nausea or vomiting Anesthetic complications: no    Last Vitals:  Filed Vitals:   02/28/15 1520 02/28/15 1530  BP: 138/70 122/56  Pulse: 96 89  Temp:    Resp: 18 14    Last Pain: There were no vitals filed for this visit.               Precious Haws Piscitello

## 2015-03-05 ENCOUNTER — Encounter: Payer: Self-pay | Admitting: Unknown Physician Specialty

## 2015-03-05 LAB — SURGICAL PATHOLOGY

## 2015-04-26 ENCOUNTER — Emergency Department: Payer: BLUE CROSS/BLUE SHIELD

## 2015-04-26 ENCOUNTER — Encounter: Payer: Self-pay | Admitting: *Deleted

## 2015-04-26 ENCOUNTER — Inpatient Hospital Stay
Admission: EM | Admit: 2015-04-26 | Discharge: 2015-04-30 | DRG: 554 | Disposition: A | Discharge status: Skilled Nursing Facility | Payer: BLUE CROSS/BLUE SHIELD | Attending: Internal Medicine | Admitting: Internal Medicine

## 2015-04-26 DIAGNOSIS — E785 Hyperlipidemia, unspecified: Secondary | ICD-10-CM | POA: Diagnosis present

## 2015-04-26 DIAGNOSIS — M7122 Synovial cyst of popliteal space [Baker], left knee: Secondary | ICD-10-CM | POA: Diagnosis present

## 2015-04-26 DIAGNOSIS — S83412A Sprain of medial collateral ligament of left knee, initial encounter: Secondary | ICD-10-CM | POA: Diagnosis present

## 2015-04-26 DIAGNOSIS — Z8249 Family history of ischemic heart disease and other diseases of the circulatory system: Secondary | ICD-10-CM

## 2015-04-26 DIAGNOSIS — W1830XA Fall on same level, unspecified, initial encounter: Secondary | ICD-10-CM | POA: Diagnosis present

## 2015-04-26 DIAGNOSIS — J449 Chronic obstructive pulmonary disease, unspecified: Secondary | ICD-10-CM | POA: Diagnosis present

## 2015-04-26 DIAGNOSIS — F419 Anxiety disorder, unspecified: Secondary | ICD-10-CM | POA: Diagnosis present

## 2015-04-26 DIAGNOSIS — Z6841 Body Mass Index (BMI) 40.0 and over, adult: Secondary | ICD-10-CM

## 2015-04-26 DIAGNOSIS — S91115A Laceration without foreign body of left lesser toe(s) without damage to nail, initial encounter: Secondary | ICD-10-CM | POA: Diagnosis present

## 2015-04-26 DIAGNOSIS — G43909 Migraine, unspecified, not intractable, without status migrainosus: Secondary | ICD-10-CM | POA: Diagnosis present

## 2015-04-26 DIAGNOSIS — Z96649 Presence of unspecified artificial hip joint: Secondary | ICD-10-CM | POA: Diagnosis present

## 2015-04-26 DIAGNOSIS — IMO0002 Reserved for concepts with insufficient information to code with codable children: Secondary | ICD-10-CM

## 2015-04-26 DIAGNOSIS — R296 Repeated falls: Secondary | ICD-10-CM | POA: Diagnosis present

## 2015-04-26 DIAGNOSIS — J302 Other seasonal allergic rhinitis: Secondary | ICD-10-CM | POA: Diagnosis present

## 2015-04-26 DIAGNOSIS — M1712 Unilateral primary osteoarthritis, left knee: Secondary | ICD-10-CM | POA: Diagnosis not present

## 2015-04-26 DIAGNOSIS — M66259 Spontaneous rupture of extensor tendons, unspecified thigh: Secondary | ICD-10-CM

## 2015-04-26 DIAGNOSIS — Z79899 Other long term (current) drug therapy: Secondary | ICD-10-CM

## 2015-04-26 DIAGNOSIS — M659 Synovitis and tenosynovitis, unspecified: Secondary | ICD-10-CM | POA: Diagnosis present

## 2015-04-26 DIAGNOSIS — R55 Syncope and collapse: Secondary | ICD-10-CM

## 2015-04-26 DIAGNOSIS — Z800 Family history of malignant neoplasm of digestive organs: Secondary | ICD-10-CM

## 2015-04-26 DIAGNOSIS — I639 Cerebral infarction, unspecified: Secondary | ICD-10-CM | POA: Diagnosis present

## 2015-04-26 DIAGNOSIS — Z23 Encounter for immunization: Secondary | ICD-10-CM

## 2015-04-26 DIAGNOSIS — Z87891 Personal history of nicotine dependence: Secondary | ICD-10-CM

## 2015-04-26 DIAGNOSIS — I10 Essential (primary) hypertension: Secondary | ICD-10-CM | POA: Diagnosis present

## 2015-04-26 DIAGNOSIS — R260 Ataxic gait: Secondary | ICD-10-CM | POA: Diagnosis present

## 2015-04-26 DIAGNOSIS — Y92009 Unspecified place in unspecified non-institutional (private) residence as the place of occurrence of the external cause: Secondary | ICD-10-CM

## 2015-04-26 LAB — TROPONIN I: Troponin I: <0.03 ng/mL (ref <0.031)

## 2015-04-26 LAB — BASIC METABOLIC PANEL
Anion gap: 6 (ref 5–15)
BUN: 29 mg/dL — AB (ref 6–20)
CALCIUM: 8.8 mg/dL — AB (ref 8.9–10.3)
CO2: 22 mmol/L (ref 22–32)
CREATININE: 1.21 mg/dL (ref 0.61–1.24)
Chloride: 112 mmol/L — ABNORMAL HIGH (ref 101–111)
GFR calc Af Amer: >60 mL/min (ref >60)
GFR calc non Af Amer: >60 mL/min (ref >60)
Glucose, Bld: 103 mg/dL — ABNORMAL HIGH (ref 65–99)
Potassium: 4.7 mmol/L (ref 3.5–5.1)
SODIUM: 140 mmol/L (ref 135–145)

## 2015-04-26 LAB — CBC
HEMATOCRIT: 45.9 % (ref 40.0–52.0)
HEMOGLOBIN: 15.2 g/dL (ref 13.0–18.0)
MCH: 30.1 pg (ref 26.0–34.0)
MCHC: 33 g/dL (ref 32.0–36.0)
MCV: 91.2 fL (ref 80.0–100.0)
Platelets: 182 10*3/uL (ref 150–440)
RBC: 5.03 MIL/uL (ref 4.40–5.90)
RDW: 15.7 % — ABNORMAL HIGH (ref 11.5–14.5)
WBC: 7.1 10*3/uL (ref 3.8–10.6)

## 2015-04-26 MED ORDER — PROPRANOLOL HCL 40 MG PO TABS
40.0000 mg | ORAL_TABLET | Freq: Two times a day (BID) | ORAL | Status: DC
  Administered 2015-04-27 – 2015-04-30 (×7): 40 mg via ORAL
  Filled 2015-04-26 (×7): qty 1

## 2015-04-26 MED ORDER — PANTOPRAZOLE SODIUM 40 MG PO TBEC
40.0000 mg | DELAYED_RELEASE_TABLET | Freq: Every day | ORAL | Status: DC
  Administered 2015-04-27 – 2015-04-30 (×4): 40 mg via ORAL
  Filled 2015-04-26 (×4): qty 1

## 2015-04-26 MED ORDER — ZOLPIDEM TARTRATE 5 MG PO TABS
10.0000 mg | ORAL_TABLET | Freq: Every evening | ORAL | Status: DC | PRN
  Administered 2015-04-27 (×2): 10 mg via ORAL
  Filled 2015-04-26 (×2): qty 2

## 2015-04-26 MED ORDER — LATANOPROST 0.005 % OP SOLN
1.0000 [drp] | Freq: Every day | OPHTHALMIC | Status: DC
  Administered 2015-04-27 – 2015-04-30 (×5): 1 [drp] via OPHTHALMIC
  Filled 2015-04-26: qty 2.5

## 2015-04-26 MED ORDER — IPRATROPIUM-ALBUTEROL 0.5-2.5 (3) MG/3ML IN SOLN: 3.0000 mL | RESPIRATORY_TRACT | Status: DC | PRN

## 2015-04-26 MED ORDER — ONDANSETRON HCL 4 MG/2ML IJ SOLN: 4.0000 mg | Freq: Four times a day (QID) | INTRAMUSCULAR | Status: DC | PRN

## 2015-04-26 MED ORDER — MORPHINE SULFATE (PF) 4 MG/ML IV SOLN
4.0000 mg | Freq: Once | INTRAVENOUS | Status: AC
  Administered 2015-04-26: 4 mg via INTRAVENOUS

## 2015-04-26 MED ORDER — BUTALBITAL-APAP-CAFFEINE 50-325-40 MG PO TABS: 1.0000 | ORAL_TABLET | ORAL | Status: DC | PRN

## 2015-04-26 MED ORDER — ASPIRIN EC 81 MG PO TBEC
81.0000 mg | DELAYED_RELEASE_TABLET | Freq: Every day | ORAL | Status: DC
  Administered 2015-04-27 – 2015-04-30 (×4): 81 mg via ORAL
  Filled 2015-04-26 (×4): qty 1

## 2015-04-26 MED ORDER — ACETAMINOPHEN 650 MG RE SUPP: 650.0000 mg | Freq: Four times a day (QID) | RECTAL | Status: DC | PRN

## 2015-04-26 MED ORDER — MORPHINE SULFATE (PF) 4 MG/ML IV SOLN
INTRAVENOUS | Status: AC
  Administered 2015-04-26: 4 mg via INTRAVENOUS
  Filled 2015-04-26: qty 1

## 2015-04-26 MED ORDER — ACETAMINOPHEN 325 MG PO TABS
650.0000 mg | ORAL_TABLET | Freq: Four times a day (QID) | ORAL | Status: DC | PRN
  Administered 2015-04-27: 650 mg via ORAL
  Filled 2015-04-26: qty 2

## 2015-04-26 MED ORDER — SODIUM CHLORIDE 0.9% FLUSH
3.0000 mL | Freq: Two times a day (BID) | INTRAVENOUS | Status: DC
  Administered 2015-04-27 – 2015-04-29 (×5): 3 mL via INTRAVENOUS

## 2015-04-26 MED ORDER — ONDANSETRON HCL 4 MG PO TABS: 4.0000 mg | ORAL_TABLET | Freq: Four times a day (QID) | ORAL | Status: DC | PRN

## 2015-04-26 MED ORDER — POLYETHYLENE GLYCOL 3350 17 G PO PACK: 17.0000 g | PACK | Freq: Every day | ORAL | Status: DC | PRN

## 2015-04-26 MED ORDER — SODIUM CHLORIDE 0.9 % IV BOLUS (SEPSIS)
1000.0000 mL | Freq: Once | INTRAVENOUS | Status: AC
  Administered 2015-04-26: 1000 mL via INTRAVENOUS

## 2015-04-26 MED ORDER — ONDANSETRON HCL 4 MG/2ML IJ SOLN
4.0000 mg | Freq: Once | INTRAMUSCULAR | Status: AC
Start: 2015-04-26 — End: 2015-04-26
  Administered 2015-04-26: 4 mg via INTRAVENOUS

## 2015-04-26 MED ORDER — DIAZEPAM 5 MG PO TABS
10.0000 mg | ORAL_TABLET | Freq: Four times a day (QID) | ORAL | Status: DC | PRN
  Administered 2015-04-27 – 2015-04-28 (×3): 10 mg via ORAL
  Filled 2015-04-26 (×3): qty 2

## 2015-04-26 MED ORDER — LIDOCAINE HCL (PF) 1 % IJ SOLN
INTRAMUSCULAR | Status: AC
  Administered 2015-04-26: 10 mL via INTRADERMAL
  Filled 2015-04-26: qty 10

## 2015-04-26 MED ORDER — ONDANSETRON HCL 4 MG/2ML IJ SOLN
INTRAMUSCULAR | Status: AC
  Administered 2015-04-26: 4 mg via INTRAVENOUS
  Filled 2015-04-26: qty 2

## 2015-04-26 MED ORDER — HYDROMORPHONE HCL 1 MG/ML IJ SOLN
INTRAMUSCULAR | Status: AC
  Administered 2015-04-26: 1 mg
  Filled 2015-04-26: qty 1

## 2015-04-26 MED ORDER — SODIUM CHLORIDE 0.9% FLUSH: 3.0000 mL | INTRAVENOUS | Status: DC | PRN

## 2015-04-26 MED ORDER — SODIUM CHLORIDE 0.9% FLUSH
3.0000 mL | Freq: Two times a day (BID) | INTRAVENOUS | Status: DC
  Administered 2015-04-28 – 2015-04-30 (×3): 3 mL via INTRAVENOUS

## 2015-04-26 MED ORDER — PRAVASTATIN SODIUM 40 MG PO TABS
40.0000 mg | ORAL_TABLET | Freq: Every day | ORAL | Status: DC
  Administered 2015-04-27 – 2015-04-28 (×2): 40 mg via ORAL
  Filled 2015-04-26 (×2): qty 1

## 2015-04-26 MED ORDER — ENOXAPARIN SODIUM 40 MG/0.4ML ~~LOC~~ SOLN
40.0000 mg | Freq: Two times a day (BID) | SUBCUTANEOUS | Status: DC
  Administered 2015-04-27 – 2015-04-30 (×8): 40 mg via SUBCUTANEOUS
  Filled 2015-04-26 (×7): qty 0.4

## 2015-04-26 MED ORDER — TETANUS-DIPHTH-ACELL PERTUSSIS 5-2.5-18.5 LF-MCG/0.5 IM SUSP
0.5000 mL | Freq: Once | INTRAMUSCULAR | Status: AC
  Administered 2015-04-26: 0.5 mL via INTRAMUSCULAR
  Filled 2015-04-26: qty 0.5

## 2015-04-26 MED ORDER — SODIUM CHLORIDE 0.9 % IV SOLN: 250.0000 mL | INTRAVENOUS | Status: DC | PRN

## 2015-04-26 MED ORDER — TIMOLOL MALEATE 0.5 % OP SOLN
1.0000 [drp] | Freq: Two times a day (BID) | OPHTHALMIC | Status: DC
  Administered 2015-04-27 – 2015-04-30 (×8): 1 [drp] via OPHTHALMIC
  Filled 2015-04-26: qty 5

## 2015-04-26 MED ORDER — OXYCODONE HCL 5 MG PO TABS
5.0000 mg | ORAL_TABLET | ORAL | Status: DC | PRN
  Administered 2015-04-27 – 2015-04-30 (×14): 5 mg via ORAL
  Filled 2015-04-26 (×16): qty 1

## 2015-04-26 MED ORDER — LIDOCAINE HCL (PF) 1 % IJ SOLN
10.0000 mL | Freq: Once | INTRAMUSCULAR | Status: AC
  Administered 2015-04-26: 10 mL via INTRADERMAL

## 2015-04-26 NOTE — ED Notes (Signed)
States a syncopal episode for the past 3 mornings when he woke up, states left toe and knee pain, unsure if he hits his head when he passes out, denies any hx of DM, states he just sits there a minute until he wakes up and then feels nromal, pt awake and alert

## 2015-04-26 NOTE — H&P (Signed)
[facility] [place] NAME: Ernest Sims    MR#:  [id number]  DATE OF BIRTH:  [birth year]  DATE OF ADMISSION:  04/26/2015  PRIMARY CARE PHYSICIAN: SPARKS,JEFFREY D, MD   REQUESTING/REFERRING PHYSICIAN: Dr. Kerman Passey  CHIEF COMPLAINT:   Chief Complaint  Patient presents with  . Loss of Consciousness    HISTORY OF PRESENT ILLNESS:  Ernest Sims  is a 60 y.o. male with a known history of hypertension, COPD, morbid obesity presents to the emergency room complaining of 3 days of falls and gait abnormalities. He initially went to urgent care due to having laceration of his left foot toes. He was referred to the emergency room here. Here patient's CT scan of the head showed left temporo-occipital subacute CVA. Patient noticed 3 days back that whenever he gets up and tries to walk he has falls. Has happened 4 times today. Not related to position. Initial concern was for syncope but patient remembers all these episodes and did not black out. He is having to use a cane for 2 days due to his gait abnormalities. No focal weakness or numbness. No dysphagia or change in vision. No history of CVA. Onset was greater than 3 hours.  PAST MEDICAL HISTORY:   Past Medical History  Diagnosis Date  . Hypertension   . Migraines   . Anxiety   . Seasonal allergies   . COPD (chronic obstructive pulmonary disease) ([place])     PAST SURGICAL HISTORY:   Past Surgical History  Procedure Laterality Date  . Ruptured disc  1984  . Revision total hip arthroplasty  2003  . Colonoscopy with propofol N/A 02/28/2015    Procedure: COLONOSCOPY WITH PROPOFOL;  Surgeon: Manya Silvas, MD;  Location: [facility] ENDOSCOPY;  Service: Endoscopy;  Laterality: N/A;  . Esophagogastroduodenoscopy (egd) with propofol N/A 02/28/2015    Procedure: ESOPHAGOGASTRODUODENOSCOPY (EGD) WITH PROPOFOL;  Surgeon: Manya Silvas, MD;  Location: [facility] ENDOSCOPY;  Service: Endoscopy;   Laterality: N/A;    SOCIAL HISTORY:   Social History  Substance Use Topics  . Smoking status: Former Smoker -- 3.00 packs/day for 24 years    Types: Cigarettes    Quit date: 05/21/1988  . Smokeless tobacco: Never Used  . Alcohol Use: 0.0 oz/week    0 Standard drinks or equivalent per week     Comment: occasional    FAMILY HISTORY:   Family History  Problem Relation Age of Onset  . Lung disease Father   . Lung disease Mother   . Heart disease Father   . Cancer Brother     liver  . Cancer Maternal Aunt     DRUG ALLERGIES:   Allergies  Allergen Reactions  . Aspirin Nausea And Vomiting  . Etodolac Nausea And Vomiting  . Penicillins Other (See Comments)    Reaction:  Unknown     REVIEW OF SYSTEMS:   Review of Systems  Constitutional: Positive for malaise/fatigue. Negative for fever, chills and weight loss.  HENT: Negative for hearing loss and nosebleeds.   Eyes: Negative for blurred vision, double vision and pain.  Respiratory: Positive for shortness of breath (chronic). Negative for cough, hemoptysis, sputum production and wheezing.   Cardiovascular: Negative for chest pain, palpitations, orthopnea and leg swelling.  Gastrointestinal: Negative for nausea, vomiting, abdominal pain, diarrhea and constipation.  Genitourinary: Negative for dysuria and hematuria.  Musculoskeletal: Positive for back pain, joint pain and falls. Negative for myalgias.  Skin: Negative for rash.  Neurological:  Positive for dizziness. Negative for tremors, sensory change, speech change, focal weakness, seizures and headaches.  Endo/Heme/Allergies: Does not bruise/bleed easily.  Psychiatric/Behavioral: Negative for depression and memory loss. The patient is not nervous/anxious.     MEDICATIONS AT HOME:   Prior to Admission medications   Medication Sig Start Date End Date Taking? Authorizing Provider  allopurinol (ZYLOPRIM) 300 MG tablet Take 300 mg by mouth as needed.    Yes Historical  Provider, MD  butalbital-acetaminophen-caffeine (FIORICET, ESGIC) 50-325-40 MG per tablet Take 1 tablet by mouth every 4 (four) hours as needed for headache.   Yes Historical Provider, MD  diazepam (VALIUM) 10 MG tablet Take 10 mg by mouth every 6 (six) hours as needed for anxiety.   Yes Historical Provider, MD  Fluticasone-Salmeterol (ADVAIR) 250-50 MCG/DOSE AEPB Inhale 1 puff into the lungs 2 (two) times daily.   Yes Historical Provider, MD  latanoprost (XALATAN) 0.005 % ophthalmic solution Place 1 drop into both eyes at bedtime.   Yes Historical Provider, MD  olmesartan (BENICAR) 20 MG tablet Take 20 mg by mouth daily.   Yes Historical Provider, MD  omeprazole (PRILOSEC) 40 MG capsule Take 40 mg by mouth daily.   Yes Historical Provider, MD  oxyCODONE (OXY IR/ROXICODONE) 5 MG immediate release tablet Take 5-10 tablets by mouth every 4 (four) hours as needed for severe pain.    Yes Historical Provider, MD  pravastatin (PRAVACHOL) 40 MG tablet Take 40 mg by mouth daily.   Yes Historical Provider, MD  propranolol (INDERAL) 40 MG tablet Take 40 mg by mouth 2 (two) times daily. 11/20/13  Yes Historical Provider, MD  pseudoephedrine (SUDAFED) 60 MG tablet Take 60 mg by mouth 2 (two) times daily.   Yes Historical Provider, MD  timolol (TIMOPTIC) 0.5 % ophthalmic solution Place 1 drop into both eyes 2 (two) times daily.   Yes Historical Provider, MD  triamcinolone (NASACORT) 55 MCG/ACT AERO nasal inhaler Place 2 sprays into the nose 2 (two) times daily as needed.    Yes Historical Provider, MD  zolpidem (AMBIEN) 10 MG tablet Take 10 mg by mouth at bedtime as needed for sleep.   Yes Historical Provider, MD     VITAL SIGNS:  Blood pressure 133/71, pulse 81, temperature 97.9 F (36.6 C), temperature source Oral, resp. rate 18, height 5' 10"  (1.778 m), weight 142.883 kg (315 lb), SpO2 98 %.  PHYSICAL EXAMINATION:  Physical Exam  GENERAL:  60 y.o.-year-old patient lying in the bed with no acute distress.   EYES: Pupils equal, round, reactive to light and accommodation. No scleral icterus. Extraocular muscles intact.  HEENT: Head atraumatic, normocephalic. Oropharynx and nasopharynx clear. No oropharyngeal erythema, moist oral mucosa  NECK:  Supple, no jugular venous distention. No thyroid enlargement, no tenderness.  LUNGS: Normal breath sounds bilaterally, no wheezing, rales, rhonchi. No use of accessory muscles of respiration.  CARDIOVASCULAR: S1, S2 normal. No murmurs, rubs, or gallops.  ABDOMEN: Soft, nontender, nondistended. Bowel sounds present. No organomegaly or mass.  EXTREMITIES: No pedal edema, cyanosis, or clubbing. + 2 pedal & radial pulses b/l.  Pain and effusion of left knee. Dressing over her left foot NEUROLOGIC: Cranial nerves II through XII are intact. No focal Motor or sensory deficits appreciated b/l PSYCHIATRIC: The patient is alert and oriented x 3. Good affect.  SKIN: No obvious rash, lesion, or ulcer.   LABORATORY PANEL:   CBC  Recent Labs Lab 04/26/15 1752  WBC 7.1  HGB 15.2  HCT 45.9  PLT 182   ------------------------------------------------------------------------------------------------------------------  Chemistries   Recent Labs Lab 04/26/15 1752  NA 140  K 4.7  CL 112*  CO2 22  GLUCOSE 103*  BUN 29*  CREATININE 1.21  CALCIUM 8.8*   ------------------------------------------------------------------------------------------------------------------  Cardiac Enzymes  Recent Labs Lab 04/26/15 1752  TROPONINI <0.03   ------------------------------------------------------------------------------------------------------------------  RADIOLOGY:  Ct Head Wo Contrast  04/26/2015  CLINICAL DATA:  Syncope upon waking for 3 days. EXAM: CT HEAD WITHOUT CONTRAST TECHNIQUE: Contiguous axial images were obtained from the base of the skull through the vertex without intravenous contrast. COMPARISON:  None. FINDINGS: Ill-defined cortical and subcortical  hypodensity involving the left posterior temporal occipital lobe, concerning for subacute infarct. No intracranial hemorrhage, mass effect, or midline shift. No hydrocephalus. The basilar cisterns are patent. No intracranial fluid collection. Calvarium is intact. There is mucosal thickening of the right greater than left maxillary sinus, right side of sphenoid sinus, left frontal sinus and ethmoid air cells. The mastoid air cells are well aerated. IMPRESSION: Ill-defined cortical and subcortical hypodensity involving the left posterior occipital temporal lobe, concerning for subacute infarct. No significant mass effect or hemorrhage. These results were called by telephone at the time of interpretation on 04/26/2015 at 7:20 pm to Dr. Nance Pear , who verbally acknowledged these results. Electronically Signed   By: Jeb Levering M.D.   On: 04/26/2015 19:21   Dg Knee Complete 4 Views Left  04/26/2015  CLINICAL DATA:  60 year old who has had syncopal episodes each morning for the past 3 days when he awoke. Left knee pain, possibly related to one of the falls. Initial encounter. EXAM: LEFT KNEE - COMPLETE 4+ VIEW COMPARISON:  None. FINDINGS: No evidence of acute fracture or dislocation. Tricompartment joint space narrowing and associated hypertrophic spurring, most severe in the medial compartment. Chondrocalcinosis involving the lateral and medial menisci. Bone mineral density well-preserved. Small joint effusion. Thickening of the patellar tendon below the knee. IMPRESSION: 1. No acute osseous abnormality. 2. 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 3. Small joint effusion. 4. Thickening of the patellar tendon, query tendonitis or injury. Electronically Signed   By: Evangeline Dakin M.D.   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 - Lovenox for DVT prophylaxis. - PT/OT/Speech consult as needed per symptoms  Patient remembers all the episodes he has fallen and is not syncope.  * Hypertension Continue home medications  * DVT prophylaxis with Lovenox   All the records are reviewed and case discussed with ED provider. Management plans discussed with the patient, family and they are in agreement.  CODE STATUS: FULL  TOTAL TIME TAKING CARE OF THIS PATIENT: 40 minutes.   Hillary Bow R M.D on 04/26/2015 at 11:13 PM  Between 7am to 6pm - Pager - [phone]  After 6pm go to [email or web address] - password [email or web address] [facility]  Office  [phone]  CC: Primary care physician; Idelle Crouch, MD  Note: This dictation was prepared with Dragon dictation along with smaller phrase technology. Any transcriptional errors that result from this process are unintentional.

## 2015-04-26 NOTE — ED Provider Notes (Signed)
[facility] Emergency Department Provider Note  Time seen: 7:43 PM  I have reviewed the triage vital signs and the nursing notes.   HISTORY  Chief Complaint Loss of Consciousness    HPI Ernest Sims is a 60 y.o. male with a past medical history of hypertension, migraines, anxiety, COPD who presents the emergency department with 3 syncopal episodes, left fourth and fifth toe lacerations, left knee pain. According to the patient for the past 3 days he has had syncopal episodes each day. States they typically occur when he first stands up. He also states he has been feeling off balance for the past few days. Patient states with these most recent syncopal episode he fell causing left knee pain and cuts to his left fourth and fifth toes. Patient went to the urgent care for the cuts in the knee pain but was referred to the emergency department due to the syncopal episodes. Patient denies any focal weakness or numbness, besides a mild right arm weakness but states that is been present since his rotator cuff surgery 4 months ago. States his pain as a 10/10 in the left knee however he has been able to ambulate although with pain. Patient suffered 2 cuts to the bottoms of his fourth and fifth toes.     Past Medical History  Diagnosis Date  . Hypertension   . Migraines   . Anxiety   . Seasonal allergies   . COPD (chronic obstructive pulmonary disease) [facility])     Patient Active Problem List   Diagnosis Date Noted  . Acid reflux 07/12/2014  . Hoarseness 07/12/2014  . Shortness of breath 05/22/2014  . Morbid obesity ([place]) 05/22/2014  . Physical deconditioning 05/22/2014    Past Surgical History  Procedure Laterality Date  . Ruptured disc  1984  . Revision total hip arthroplasty  2003  . Colonoscopy with propofol N/A 02/28/2015    Procedure: COLONOSCOPY WITH PROPOFOL;  Surgeon: Manya Silvas, MD;  Location: [facility] ENDOSCOPY;  Service: Endoscopy;  Laterality:  N/A;  . Esophagogastroduodenoscopy (egd) with propofol N/A 02/28/2015    Procedure: ESOPHAGOGASTRODUODENOSCOPY (EGD) WITH PROPOFOL;  Surgeon: Manya Silvas, MD;  Location: [facility] ENDOSCOPY;  Service: Endoscopy;  Laterality: N/A;    Current Outpatient Rx  Name  Route  Sig  Dispense  Refill  . allopurinol (ZYLOPRIM) 300 MG tablet   Oral   Take 600 mg by mouth as needed.          . budesonide-formoterol (SYMBICORT) 160-4.5 MCG/ACT inhaler   Inhalation   Inhale 2 puffs into the lungs 2 (two) times daily.         . butalbital-acetaminophen-caffeine (FIORICET, ESGIC) 50-325-40 MG per tablet   Oral   Take 1 tablet by mouth every 4 (four) hours as needed for headache.         . diazepam (VALIUM) 10 MG tablet   Oral   Take 10 mg by mouth every 6 (six) hours as needed for anxiety.         Marland Kitchen olmesartan (BENICAR) 20 MG tablet   Oral   Take 20 mg by mouth daily.         Marland Kitchen omeprazole (PRILOSEC) 40 MG capsule   Oral   Take 40 mg by mouth daily.         Marland Kitchen oxyCODONE (OXY IR/ROXICODONE) 5 MG immediate release tablet   Oral   Take 1-2 tablets by mouth every 4 (four) hours.      0   .  pravastatin (PRAVACHOL) 40 MG tablet   Oral   Take 40 mg by mouth daily.         . promethazine (PHENERGAN) 25 MG tablet   Oral   Take 1 tablet by mouth every 6 (six) hours as needed.      0   . propranolol (INDERAL) 40 MG tablet   Oral   Take 40 mg by mouth 2 (two) times daily.         . pseudoephedrine (SUDAFED) 60 MG tablet   Oral   Take 60 mg by mouth 2 (two) times daily.         Marland Kitchen triamcinolone (NASACORT) 55 MCG/ACT AERO nasal inhaler   Nasal   Place 2 sprays into the nose 2 (two) times daily as needed.          . zolpidem (AMBIEN) 10 MG tablet   Oral   Take 10 mg by mouth at bedtime as needed for sleep.           Allergies Aspirin; Etodolac; Penicillin g; Penicillin v potassium; and Penicillins  Family History  Problem Relation Age of Onset  . Lung disease  Father   . Lung disease Mother   . Heart disease Father   . Cancer Brother     liver  . Cancer Maternal Aunt     Social History Social History  Substance Use Topics  . Smoking status: Former Smoker -- 3.00 packs/day for 24 years    Types: Cigarettes    Quit date: 05/21/1988  . Smokeless tobacco: Never Used  . Alcohol Use: 0.0 oz/week    0 Standard drinks or equivalent per week     Comment: occasional    Review of Systems Constitutional: Negative for fever. Cardiovascular: Negative for chest pain. Respiratory: Negative for shortness of breath. Gastrointestinal: Negative for abdominal pain Neurological: Negative for . Denies focal weakness or numbness. Does state he has been feeling off balance for the past few days. 10-point ROS otherwise negative.  ____________________________________________   PHYSICAL EXAM:  VITAL SIGNS: ED Triage Vitals  Enc Vitals Group     BP 04/26/15 1750 139/97 mmHg     Pulse Rate 04/26/15 1750 76     Resp 04/26/15 1750 22     Temp 04/26/15 1750 97.9 F (36.6 C)     Temp Source 04/26/15 1750 Oral     SpO2 04/26/15 1750 96 %     Weight 04/26/15 1750 315 lb (142.883 kg)     Height 04/26/15 1750 5' 10"  (1.778 m)     Head Cir --      Peak Flow --      Pain Score 04/26/15 1750 10     Pain Loc --      Pain Edu? --      Excl. in [place]? --     Constitutional: Alert and oriented. Well appearing and in no distress. Eyes: Normal exam ENT   Head: Normocephalic and atraumatic.   Mouth/Throat: Mucous membranes are moist. Cardiovascular: Normal rate, regular rhythm. No murmur Respiratory: Normal respiratory effort without tachypnea nor retractions. Breath sounds are clear Gastrointestinal: Soft and nontender. No distention. Musculoskeletal: Moderate tenderness palpation of the left knee, pain with range of motion. Neurovascularly intact distally. 1.5; laceration to the bottom of the left fifth toe, 1.5; laceration to the bottom of the left  fourth toe. Hemostatic. Neurologic:  Normal speech and language. No gross focal neurologic deficits Skin:  Skin is warm, dry, lacerations as above. Psychiatric:  Mood and affect are normal. Speech and behavior are normal.  ____________________________________________    EKG  EKG reviewed and interpreted by myself shows accelerated junctional rhythm at 66 bpm, narrow QRS, normal axis, normal intervals, no ST changes.  ____________________________________________    RADIOLOGY  Left temporal lobe subacute infarct.  ____________________________________________    INITIAL IMPRESSION / ASSESSMENT AND PLAN / ED COURSE  Pertinent labs & imaging results that were available during my care of the patient were reviewed by me and considered in my medical decision making (see chart for details).  Patient presents to the emergency department with multiple syncopal episodes of the past 3 days. Patient also states he has been feeling off balance. Patient has moderate left knee tenderness to palpation, pain with range of motion. Patient has 1.5 cm laceration to the bottom of the left fifth toe, 1.5 cm laceration to the bottom of the left fourth toe. Denies any focal weakness or numbness. CT scan shows a likely subacute infarct in the left temporal lobe. It is unclear if this is related to the patient's syncopal episodes. We'll obtain a left knee x-ray, I have added on cardiac enzymes. I believe the patient will require an MRI. However given the patient's recent sacral episodes over the past 3 days heavily the patient would likely benefit from admission to the hospital with an MRI performed in the morning.   LACERATION REPAIR Performed by: Harvest Dark Authorized by: Harvest Dark Consent: Verbal consent obtained. Risks and benefits: risks, benefits and alternatives were discussed Consent given by: patient Patient identity confirmed: provided demographic data Prepped and Draped in normal  sterile fashion Wound explored  Laceration Location: 1.5 cm laceration to plantar aspect left fifth toe  Laceration Length: 1.5 cm  No Foreign Bodies seen or palpated  Anesthesia: local infiltration  Local anesthetic: lidocaine 1 % without epinephrine  Anesthetic total: 3 ml  Irrigation method: syringe Amount of cleaning: standard  Skin closure: 4-0 proline   Number of sutures: 3   Technique: Simple interrupted   Patient tolerance: Patient tolerated the procedure well with no immediate complications.   LACERATION REPAIR Performed by: Harvest Dark Authorized by: Harvest Dark Consent: Verbal consent obtained. Risks and benefits: risks, benefits and alternatives were discussed Consent given by: patient Patient identity confirmed: provided demographic data Prepped and Draped in normal sterile fashion Wound explored  Laceration Location: Plantar aspect of left fourth toe  Laceration Length: 1.5 cm  No Foreign Bodies seen or palpated  Anesthesia: local infiltration  Local anesthetic: lidocaine 1 % without epinephrine  Anesthetic total: 3 ml  Irrigation method: syringe Amount of cleaning: standard  Skin closure: 4-0 proline   Number of sutures: 2   Technique: Simple interrupted   Patient tolerance: Patient tolerated the procedure well with no immediate complications.    ____________________________________________   FINAL CLINICAL IMPRESSION(S) / ED DIAGNOSES  Multiple syncopal episodes Subacute cerebral infarct Laceration   Harvest Dark, MD 04/26/15 2248

## 2015-04-26 NOTE — ED Notes (Signed)
MD Paduchowski at bedside at this time

## 2015-04-27 ENCOUNTER — Inpatient Hospital Stay: Payer: BLUE CROSS/BLUE SHIELD

## 2015-04-27 ENCOUNTER — Observation Stay: Payer: BLUE CROSS/BLUE SHIELD

## 2015-04-27 DIAGNOSIS — S91115A Laceration without foreign body of left lesser toe(s) without damage to nail, initial encounter: Secondary | ICD-10-CM | POA: Diagnosis present

## 2015-04-27 DIAGNOSIS — F419 Anxiety disorder, unspecified: Secondary | ICD-10-CM | POA: Diagnosis present

## 2015-04-27 DIAGNOSIS — Z96649 Presence of unspecified artificial hip joint: Secondary | ICD-10-CM | POA: Diagnosis present

## 2015-04-27 DIAGNOSIS — M1712 Unilateral primary osteoarthritis, left knee: Secondary | ICD-10-CM | POA: Diagnosis present

## 2015-04-27 DIAGNOSIS — I10 Essential (primary) hypertension: Secondary | ICD-10-CM | POA: Diagnosis present

## 2015-04-27 DIAGNOSIS — R260 Ataxic gait: Secondary | ICD-10-CM | POA: Diagnosis present

## 2015-04-27 DIAGNOSIS — R55 Syncope and collapse: Secondary | ICD-10-CM | POA: Diagnosis present

## 2015-04-27 DIAGNOSIS — M659 Synovitis and tenosynovitis, unspecified: Secondary | ICD-10-CM | POA: Diagnosis present

## 2015-04-27 DIAGNOSIS — W1830XA Fall on same level, unspecified, initial encounter: Secondary | ICD-10-CM | POA: Diagnosis present

## 2015-04-27 DIAGNOSIS — R296 Repeated falls: Secondary | ICD-10-CM | POA: Diagnosis present

## 2015-04-27 DIAGNOSIS — G43909 Migraine, unspecified, not intractable, without status migrainosus: Secondary | ICD-10-CM | POA: Diagnosis present

## 2015-04-27 DIAGNOSIS — R2681 Unsteadiness on feet: Secondary | ICD-10-CM | POA: Diagnosis not present

## 2015-04-27 DIAGNOSIS — Z6841 Body Mass Index (BMI) 40.0 and over, adult: Secondary | ICD-10-CM | POA: Diagnosis not present

## 2015-04-27 DIAGNOSIS — Z79899 Other long term (current) drug therapy: Secondary | ICD-10-CM | POA: Diagnosis not present

## 2015-04-27 DIAGNOSIS — S83412A Sprain of medial collateral ligament of left knee, initial encounter: Secondary | ICD-10-CM | POA: Diagnosis present

## 2015-04-27 DIAGNOSIS — Y92009 Unspecified place in unspecified non-institutional (private) residence as the place of occurrence of the external cause: Secondary | ICD-10-CM | POA: Diagnosis not present

## 2015-04-27 DIAGNOSIS — Z23 Encounter for immunization: Secondary | ICD-10-CM | POA: Diagnosis not present

## 2015-04-27 DIAGNOSIS — Z8249 Family history of ischemic heart disease and other diseases of the circulatory system: Secondary | ICD-10-CM | POA: Diagnosis not present

## 2015-04-27 DIAGNOSIS — E785 Hyperlipidemia, unspecified: Secondary | ICD-10-CM | POA: Diagnosis present

## 2015-04-27 DIAGNOSIS — Z87891 Personal history of nicotine dependence: Secondary | ICD-10-CM | POA: Diagnosis not present

## 2015-04-27 DIAGNOSIS — J449 Chronic obstructive pulmonary disease, unspecified: Secondary | ICD-10-CM | POA: Diagnosis present

## 2015-04-27 DIAGNOSIS — Z800 Family history of malignant neoplasm of digestive organs: Secondary | ICD-10-CM | POA: Diagnosis not present

## 2015-04-27 DIAGNOSIS — M7122 Synovial cyst of popliteal space [Baker], left knee: Secondary | ICD-10-CM | POA: Diagnosis present

## 2015-04-27 DIAGNOSIS — J302 Other seasonal allergic rhinitis: Secondary | ICD-10-CM | POA: Diagnosis present

## 2015-04-27 LAB — URINALYSIS COMPLETE WITH MICROSCOPIC (ARMC ONLY)
BACTERIA UA: NONE SEEN
Bilirubin Urine: NEGATIVE
Glucose, UA: NEGATIVE mg/dL
HGB URINE DIPSTICK: NEGATIVE
Ketones, ur: NEGATIVE mg/dL
LEUKOCYTES UA: NEGATIVE
NITRITE: NEGATIVE
Protein, ur: NEGATIVE mg/dL
RBC / HPF: NONE SEEN RBC/hpf (ref 0–5)
SPECIFIC GRAVITY, URINE: 1.021 (ref 1.005–1.030)
pH: 5 (ref 5.0–8.0)

## 2015-04-27 LAB — LIPID PANEL
CHOL/HDL RATIO: 4.3 ratio
CHOLESTEROL: 172 mg/dL (ref 0–200)
HDL: 40 mg/dL — ABNORMAL LOW (ref >40)
LDL CALC: 73 mg/dL (ref 0–99)
Triglycerides: 297 mg/dL — ABNORMAL HIGH (ref <150)
VLDL: 59 mg/dL — AB (ref 0–40)

## 2015-04-27 LAB — TROPONIN I

## 2015-04-27 MED ORDER — HYDROMORPHONE HCL 1 MG/ML IJ SOLN
1.0000 mg | INTRAMUSCULAR | Status: DC | PRN
  Administered 2015-04-27 – 2015-04-30 (×11): 1 mg via INTRAVENOUS
  Filled 2015-04-27 (×12): qty 1

## 2015-04-27 MED ORDER — DOCUSATE SODIUM 100 MG PO CAPS
100.0000 mg | ORAL_CAPSULE | Freq: Two times a day (BID) | ORAL | Status: DC
  Administered 2015-04-27 – 2015-04-30 (×6): 100 mg via ORAL
  Filled 2015-04-27 (×6): qty 1

## 2015-04-27 MED ORDER — POLYETHYLENE GLYCOL 3350 17 G PO PACK
17.0000 g | PACK | Freq: Every day | ORAL | Status: DC
  Administered 2015-04-27 – 2015-04-29 (×3): 17 g via ORAL
  Filled 2015-04-27 (×4): qty 1

## 2015-04-27 MED ORDER — MORPHINE SULFATE (PF) 2 MG/ML IV SOLN
2.0000 mg | INTRAVENOUS | Status: DC | PRN
  Administered 2015-04-27 (×3): 2 mg via INTRAVENOUS
  Filled 2015-04-27 (×4): qty 1

## 2015-04-27 NOTE — Progress Notes (Addendum)
[facility] [place] NAME: Churchill Grimsley    MR#:  [id number]  DATE OF BIRTH:  18-Sep-1955  SUBJECTIVE:   Complaining of left knee pain unable to move the left knee due to pain.  REVIEW OF SYSTEMS:    Review of Systems  Constitutional: Negative for fever, chills and malaise/fatigue.  HENT: Negative for sore throat.   Eyes: Negative for blurred vision.  Respiratory: Negative for cough, hemoptysis, shortness of breath and wheezing.   Cardiovascular: Negative for chest pain, palpitations and leg swelling.  Gastrointestinal: Negative for nausea, vomiting, abdominal pain, diarrhea and blood in stool.  Genitourinary: Negative for dysuria.  Musculoskeletal: Positive for joint pain and falls. Negative for back pain.  Neurological: Negative for dizziness, tremors and headaches.  Endo/Heme/Allergies: Does not bruise/bleed easily.    Tolerating Diet: Yes      DRUG ALLERGIES:   Allergies  Allergen Reactions  . Aspirin Nausea And Vomiting  . Etodolac Nausea And Vomiting  . Penicillins Other (See Comments)    Reaction:  Unknown     VITALS:  Blood pressure 158/74, pulse 91, temperature 99.1 F (37.3 C), temperature source Oral, resp. rate 18, height 5' 10"  (1.778 m), weight 138.529 kg (305 lb 6.4 oz), SpO2 98 %.  PHYSICAL EXAMINATION:   Physical Exam  Constitutional: He is oriented to person, place, and time and well-developed, well-nourished, and in no distress. No distress.  HENT:  Head: Normocephalic.  Eyes: No scleral icterus.  Neck: Normal range of motion. Neck supple. No JVD present. No tracheal deviation present.  Cardiovascular: Normal rate, regular rhythm and normal heart sounds.  Exam reveals no gallop and no friction rub.   No murmur heard. Pulmonary/Chest: Effort normal and breath sounds normal. No respiratory distress. He has no wheezes. He has no rales. He exhibits no tenderness.  Abdominal: Soft. Bowel sounds  are normal. He exhibits no distension and no mass. There is no tenderness. There is no rebound and no guarding.  Musculoskeletal: Normal range of motion. He exhibits edema.  Left knee swollen quadripceps  Neurological: He is alert and oriented to person, place, and time.  Skin: Skin is warm. No rash noted. No erythema.  Psychiatric: Affect and judgment normal.      LABORATORY PANEL:   CBC  Recent Labs Lab 04/26/15 1752  WBC 7.1  HGB 15.2  HCT 45.9  PLT 182   ------------------------------------------------------------------------------------------------------------------  Chemistries   Recent Labs Lab 04/26/15 1752  NA 140  K 4.7  CL 112*  CO2 22  GLUCOSE 103*  BUN 29*  CREATININE 1.21  CALCIUM 8.8*   ------------------------------------------------------------------------------------------------------------------  Cardiac Enzymes  Recent Labs Lab 04/26/15 1752 04/26/15 2310 04/27/15 0445  TROPONINI <0.03 <0.03 <0.03   ------------------------------------------------------------------------------------------------------------------  RADIOLOGY:  Ct Head Wo Contrast  04/26/2015  CLINICAL DATA:  Syncope upon waking for 3 days. EXAM: CT HEAD WITHOUT CONTRAST TECHNIQUE: Contiguous axial images were obtained from the base of the skull through the vertex without intravenous contrast. COMPARISON:  None. FINDINGS: Ill-defined cortical and subcortical hypodensity involving the left posterior temporal occipital lobe, concerning for subacute infarct. No intracranial hemorrhage, mass effect, or midline shift. No hydrocephalus. The basilar cisterns are patent. No intracranial fluid collection. Calvarium is intact. There is mucosal thickening of the right greater than left maxillary sinus, right side of sphenoid sinus, left frontal sinus and ethmoid air cells. The mastoid air cells are well aerated. IMPRESSION: Ill-defined cortical and subcortical hypodensity involving the left  posterior occipital temporal lobe, concerning for subacute infarct. No significant mass effect or hemorrhage. These results were called by telephone at the time of interpretation on 04/26/2015 at 7:20 pm to Dr. Nance Pear , who verbally acknowledged these results. Electronically Signed   By: Jeb Levering M.D.   On: 04/26/2015 19:21   Dg Knee Complete 4 Views Left  04/26/2015  CLINICAL DATA:  60 year old who has had syncopal episodes each morning for the past 3 days when he awoke. Left knee pain, possibly related to one of the falls. Initial encounter. EXAM: LEFT KNEE - COMPLETE 4+ VIEW COMPARISON:  None. FINDINGS: No evidence of acute fracture or dislocation. Tricompartment joint space narrowing and associated hypertrophic spurring, most severe in the medial compartment. Chondrocalcinosis involving the lateral and medial menisci. Bone mineral density well-preserved. Small joint effusion. Thickening of the patellar tendon below the knee. IMPRESSION: 1. No acute osseous abnormality. 2. 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 3. Small joint effusion. 4. Thickening of the patellar tendon, query tendonitis or injury. Electronically Signed   By: Evangeline Dakin M.D.   On: 04/26/2015 20:29     ASSESSMENT AND PLAN:   60 year old male with a known history of essential hypertension and morbid obesity who presented with a fall and gait abnormality.   1. 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 Continue aspirin and statin. Patient is physical therapy consultation. LDL 73  2. Left knee pain: We'll order MRI to evaluate for quadriceps tendon rupture or meniscal tear. Continue pain medications and PT consult. 3. Essential hypertension: Continue propanolol.  4. Morbid obesity: Patient will need to be encouraged to lose weight after acute issues are resolved.   Management plans  discussed with the patient and he is in agreement.  CODE STATUS: FULL  TOTAL TIME TAKING CARE OF THIS PATIENT: 30 minutes.     POSSIBLE D/C 2-3 days, DEPENDING ON CLINICAL CONDITION.   Dedrick Heffner M.D on 04/27/2015 at 9:43 AM  Between 7am to 6pm - Pager - [phone] After 6pm go to [email or web address] - password [email or web address] [facility]  Office  [phone]  CC: Primary care physician; Idelle Crouch, MD  Note: This dictation was prepared with Dragon dictation along with smaller phrase technology. Any transcriptional errors that result from this process are unintentional.

## 2015-04-27 NOTE — Progress Notes (Signed)
PT Cancellation Note  Patient Details Name: Ernest Sims MRN: [id number] DOB: [birth year]   Cancelled Treatment:    Reason Eval/Treat Not Completed: Medical issues which prohibited therapy (Awaiting MRI of L knee and possible ortho consult).  Try tomorrow after decisions and testing done.   Ramond Dial 04/27/2015, 11:39 AM   Mee Hives, PT MS Acute Rehab Dept. Number: [facility] [id number] and [place] [phone]

## 2015-04-28 MED ORDER — IRBESARTAN 75 MG PO TABS
150.0000 mg | ORAL_TABLET | Freq: Every day | ORAL | Status: DC
  Administered 2015-04-28 – 2015-04-30 (×3): 150 mg via ORAL
  Filled 2015-04-28 (×3): qty 2

## 2015-04-28 MED ORDER — CLINDAMYCIN HCL 150 MG PO CAPS
300.0000 mg | ORAL_CAPSULE | Freq: Three times a day (TID) | ORAL | Status: DC
  Administered 2015-04-28 – 2015-04-30 (×6): 300 mg via ORAL
  Filled 2015-04-28: qty 1
  Filled 2015-04-28: qty 2
  Filled 2015-04-28: qty 1
  Filled 2015-04-28 (×2): qty 2
  Filled 2015-04-28: qty 1
  Filled 2015-04-28 (×5): qty 2

## 2015-04-28 MED ORDER — TRIAMCINOLONE ACETONIDE 40 MG/ML IJ SUSP
40.0000 mg | Freq: Once | INTRAMUSCULAR | Status: AC
  Administered 2015-04-28: 40 mg via INTRA_ARTICULAR
  Filled 2015-04-28: qty 1

## 2015-04-28 MED ORDER — PRAVASTATIN SODIUM 40 MG PO TABS
40.0000 mg | ORAL_TABLET | Freq: Every day | ORAL | Status: DC
  Administered 2015-04-28 – 2015-04-30 (×3): 40 mg via ORAL
  Filled 2015-04-28 (×3): qty 1

## 2015-04-28 MED ORDER — BUPIVACAINE HCL (PF) 0.5 % IJ SOLN
10.0000 mL | Freq: Once | INTRAMUSCULAR | Status: AC
  Administered 2015-04-28: 10 mL
  Filled 2015-04-28: qty 10

## 2015-04-28 NOTE — Progress Notes (Signed)
[facility] [place] NAME: Obed Samek    MR#:  [id number]  DATE OF BIRTH:  62-Sep-1957  SUBJECTIVE:   Left knee pain better controlled with Dilaudid. Physical therapy consult pending  REVIEW OF SYSTEMS:    Review of Systems  Constitutional: Negative for fever, chills and malaise/fatigue.  HENT: Negative for sore throat.   Eyes: Positive for double vision. Negative for blurred vision.  Respiratory: Negative for cough, hemoptysis, shortness of breath and wheezing.   Cardiovascular: Negative for chest pain, palpitations and leg swelling.  Gastrointestinal: Negative for nausea, vomiting, abdominal pain, diarrhea and blood in stool.  Genitourinary: Negative for dysuria.  Musculoskeletal: Positive for joint pain (swellng left knee pain better) and falls. Negative for back pain.  Neurological: Negative for dizziness, tremors and headaches.  Endo/Heme/Allergies: Does not bruise/bleed easily.    Tolerating Diet: Yes      DRUG ALLERGIES:   Allergies  Allergen Reactions  . Aspirin Nausea And Vomiting  . Etodolac Nausea And Vomiting  . Penicillins Other (See Comments)    Reaction:  Unknown     VITALS:  Blood pressure 137/74, pulse 94, temperature 98.7 F (37.1 C), temperature source Oral, resp. rate 19, height 5' 10"  (1.778 m), weight 138.801 kg (306 lb), SpO2 99 %.  PHYSICAL EXAMINATION:   Physical Exam  Constitutional: He is oriented to person, place, and time and well-developed, well-nourished, and in no distress. No distress.  HENT:  Head: Normocephalic.  Eyes: No scleral icterus.  Neck: Normal range of motion. Neck supple. No JVD present. No tracheal deviation present.  Cardiovascular: Normal rate, regular rhythm and normal heart sounds.  Exam reveals no gallop and no friction rub.   No murmur heard. Pulmonary/Chest: Effort normal and breath sounds normal. No respiratory distress. He has no wheezes. He has no  rales. He exhibits no tenderness.  Abdominal: Soft. Bowel sounds are normal. He exhibits no distension and no mass. There is no tenderness. There is no rebound and no guarding.  Musculoskeletal: Normal range of motion. He exhibits edema (left knee with swelling).  Neurological: He is alert and oriented to person, place, and time.  Skin: Skin is warm. No rash noted. No erythema.  Psychiatric: Affect and judgment normal.      LABORATORY PANEL:   CBC  Recent Labs Lab 04/26/15 1752  WBC 7.1  HGB 15.2  HCT 45.9  PLT 182   ------------------------------------------------------------------------------------------------------------------  Chemistries   Recent Labs Lab 04/26/15 1752  NA 140  K 4.7  CL 112*  CO2 22  GLUCOSE 103*  BUN 29*  CREATININE 1.21  CALCIUM 8.8*   ------------------------------------------------------------------------------------------------------------------  Cardiac Enzymes  Recent Labs Lab 04/26/15 1752 04/26/15 2310 04/27/15 0445  TROPONINI <0.03 <0.03 <0.03   ------------------------------------------------------------------------------------------------------------------  RADIOLOGY:  Ct Head Wo Contrast  04/26/2015  CLINICAL DATA:  Syncope upon waking for 3 days. EXAM: CT HEAD WITHOUT CONTRAST TECHNIQUE: Contiguous axial images were obtained from the base of the skull through the vertex without intravenous contrast. COMPARISON:  None. FINDINGS: Ill-defined cortical and subcortical hypodensity involving the left posterior temporal occipital lobe, concerning for subacute infarct. No intracranial hemorrhage, mass effect, or midline shift. No hydrocephalus. The basilar cisterns are patent. No intracranial fluid collection. Calvarium is intact. There is mucosal thickening of the right greater than left maxillary sinus, right side of sphenoid sinus, left frontal sinus and ethmoid air cells. The mastoid air cells are well aerated. IMPRESSION:  Ill-defined cortical and subcortical hypodensity  involving the left posterior occipital temporal lobe, concerning for subacute infarct. No significant mass effect or hemorrhage. These results were called by telephone at the time of interpretation on 04/26/2015 at 7:20 pm to Dr. Nance Pear , who verbally acknowledged these results. Electronically Signed   By: Jeb Levering M.D.   On: 04/26/2015 19:21   Mr Brain Wo Contrast  04/27/2015  CLINICAL DATA:  Multiple falls over the past 3 days. Progressive gait abnormality. EXAM: MRI HEAD WITHOUT CONTRAST TECHNIQUE: Multiplanar, multiecho pulse sequences of the brain and surrounding structures were obtained without intravenous contrast. COMPARISON:  CT head 04/26/2015. FINDINGS: No evidence for acute infarction, hemorrhage, mass lesion, hydrocephalus, or extra-axial fluid. Slight premature for age cerebral and cerebellar atrophy. No white matter disease of significance. Specifically, the area questioned in the LEFT parieto-occipital region is normal. Partial empty sella. No tonsillar herniation. Upper cervical region appears unremarkable. Flow voids are maintained.  No chronic hemorrhage is identified. The visualized extracranial soft tissues demonstrate chronic sinusitis involving the frontal, maxillary, and ethmoid regions. Layering fluid is seen in the smaller RIGHT division of the sphenoid. No mastoid fluid or nasopharyngeal abnormality. No osseous findings of significance. IMPRESSION: Slight premature for age atrophy.  No acute intracranial findings. The area questioned on prior CT in LEFT parietal occipital white matter is normal. Chronic and acute sinusitis. Electronically Signed   By: Staci Righter M.D.   On: 04/27/2015 16:11   Mr Knee Left  Wo Contrast  04/27/2015  CLINICAL DATA:  Recent falls over the last few days. Left knee pain with limited range of motion. EXAM: MRI OF THE LEFT KNEE WITHOUT CONTRAST TECHNIQUE: Multiplanar, multisequence MR imaging  of the knee was performed. No intravenous contrast was administered. COMPARISON:  04/26/2015 FINDINGS: Despite efforts by the technologist and patient, severe motion artifact is present on today's exam and could not be eliminated. This reduces exam sensitivity and specificity. Generally poor signal to noise ratio. MENISCI Medial meniscus:  Unremarkable Lateral meniscus: Indistinct and highly attenuated posterior horn and midbody suspicious for degenerative tearing, possible radial tear posterolaterally. The motion artifact adversely affects positive predictive value for tear. LIGAMENTS Cruciates:  Unremarkable Collaterals:  Thickened MCL with surrounding edema. CARTILAGE Patellofemoral: Moderate chondral thinning with mild chondral irregularity and marginal spurring. Medial: Moderate to prominent degenerative chondral thinning with marginal spurring. Lateral: Severe chondral thinning with marginal spurring and some peripheral extrusion of the meniscus. Joint:  Large knee joint effusion with synovitis. Popliteal Fossa: 5.3 by 2.8 by 3.0 cm popliteal cyst, extending anterolateral to the medial head gastrocnemius rather than in the characteristic location for a Baker 's cyst. Extensor Mechanism: Mild proximal patellar tendinopathy. Edema in the subcutaneous tissues superficial to the patellar retinacula. Bones: No significant extra-articular osseous abnormalities identified. IMPRESSION: 1. Severe motion artifact reduces diagnostic accuracy. 2. Suspected degenerative tearing in the posterior horn and midbody lateral meniscus. 3. Variable but generally severe osteoarthritis. 4. Thickened MCL with surrounding edema, possibly MCL sprain. 5. Large knee joint effusion with synovitis. 6. Moderate-sized but atypically located popliteal cyst, anterolateral to the medial head gastrocnemius. 7. Mild proximal patellar tendinopathy. Electronically Signed   By: Van Clines M.D.   On: 04/27/2015 15:48   US Carotid  Bilateral  04/27/2015  CLINICAL DATA:  CVA. EXAM: BILATERAL CAROTID DUPLEX ULTRASOUND TECHNIQUE: Pearline Cables scale imaging, color Doppler and duplex ultrasound were performed of bilateral carotid and vertebral arteries in the neck. COMPARISON:  None. FINDINGS: Criteria: Quantification of carotid stenosis is based on velocity parameters that  correlate the residual internal carotid diameter with NASCET-based stenosis levels, using the diameter of the distal internal carotid lumen as the denominator for stenosis measurement. The following velocity measurements were obtained: RIGHT ICA:  115 cm/sec CCA:  322 cm/sec SYSTOLIC ICA/CCA RATIO:  0.9 DIASTOLIC ICA/CCA RATIO:  1.4 ECA:  118 cm/sec LEFT ICA:  67 cm/sec CCA:  025 cm/sec SYSTOLIC ICA/CCA RATIO:  0.5 DIASTOLIC ICA/CCA RATIO:  0.8 ECA:  111 cm/sec RIGHT CAROTID ARTERY: Right carotid arteries are patent without significant plaque or stenosis. Normal waveforms and velocities in the right internal carotid artery. RIGHT VERTEBRAL ARTERY: Antegrade flow and normal waveform in the right vertebral artery. LEFT CAROTID ARTERY: Minimal plaque at the left carotid bulb. Minimal plaque at the origin of the left internal carotid artery. Normal waveforms and velocities in the left internal carotid artery without significant stenosis. LEFT VERTEBRAL ARTERY: Antegrade flow and normal waveform in the left vertebral artery. IMPRESSION: Minimal plaque at the left carotid bulb region. No significant carotid artery stenosis. Patent vertebral arteries. Electronically Signed   By: Markus Daft M.D.   On: 04/27/2015 11:03   Dg Knee Complete 4 Views Left  04/26/2015  CLINICAL DATA:  60 year old who has had syncopal episodes each morning for the past 3 days when he awoke. Left knee pain, possibly related to one of the falls. Initial encounter. EXAM: LEFT KNEE - COMPLETE 4+ VIEW COMPARISON:  None. FINDINGS: No evidence of acute fracture or dislocation. Tricompartment joint space narrowing and  associated hypertrophic spurring, most severe in the medial compartment. Chondrocalcinosis involving the lateral and medial menisci. Bone mineral density well-preserved. Small joint effusion. Thickening of the patellar tendon below the knee. IMPRESSION: 1. No acute osseous abnormality. 2. 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 3. Small joint effusion. 4. Thickening of the patellar tendon, query tendonitis or injury. Electronically Signed   By: Evangeline Dakin M.D.   On: 04/26/2015 20:29     ASSESSMENT AND PLAN:   60 year old male with a known history of essential hypertension and morbid obesity who presented with a fall and gait abnormality.   1. Ill defined area involving left posterior occipital temporal lobe on CT scan: This has been ruled out by MRI. The area questioned on prior CT in LEFT parietal occipital white matter is normal. Patient does not have this stroke as seen on CT scan.   2. Left knee pain: MRI shows no quadricep tendon rupture however does show MCL sprain with synovitis. Or the consult for possible steroid injection. Continue pain medications and PT consult for disposition..   3. Essential hypertension: Continue propanolol and ARB.  4. Morbid obesity: Patient is encouraged to lose weight  and exercise as tolerated.    5. Hyperlipidemia: Continue Pravachol. LDL 73. Management plans discussed with the patient and he is in agreement.  CODE STATUS: FULL  TOTAL TIME TAKING CARE OF THIS PATIENT: 25 minutes.     POSSIBLE D/C TOMORROW DEPENDING ON orthopedic and PT consult  Arriyana Rodell M.D on 04/28/2015 at 9:04 AM  Between 7am to 6pm - Pager - [phone] After 6pm go to [email or web address] - password [email or web address] [facility]  Office  [phone]  CC: Primary care physician; Idelle Crouch, MD  Note: This dictation was prepared with Dragon dictation along with smaller phrase technology. Any  transcriptional errors that result from this process are unintentional.

## 2015-04-28 NOTE — Op Note (Signed)
Preprocedure diagnosis left knee effusion and arthritis Postop same Procedure aspiration and injection left knee After prepping the skin with Betadine and having obtained informed consent with timeout procedure completed, a superolateral approach was made with an 18-gauge needle into the knee 50 cc of blood-tinged fluid was withdrawn that was somewhat cloudy. 40 mg Kenalog and 5 cc half percent Marcaine was injected into the knee and the needle withdrawn. No blood loss none complications specimen was the synovial fluid obtained from the knee. Patient tolerated procedure well

## 2015-04-28 NOTE — Consult Note (Signed)
Patient has been having repeated falls and is currently admitted with CVA. His left knee is very swollen and giving him a great deal of pain. He had MRI that was reviewed  He doesn't really think his knee was swollen prior to these recent falls. On exam he has weakness to like extension. Tenderness diffusely about the knee including anterior and posterior knee. Range of motion is 0-90. He has a very large effusion present and has palpable posterior Baker cyst.  MRI shows extensive arthritis as well as tendinitis and a large effusion  Impression is underlying osteoarthritis probably aggravated by recent falls.  Recommendation is aspiration and injection in hope this will get him mobilized and help with his physical therapy

## 2015-04-28 NOTE — Evaluation (Signed)
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    Clinical Impression  Pt was seen for evaluation of mobility and did get to chair with 2 person assist.  Talked with nursing about how to assist back to bed, although pt is willing to stand on walker and may be more capable by the time he sits OOB awhile.  Is sitting on a draw sheet and should have options for how to get back.  Will continue to work on there ex and standing to progress gait with pt prior to discharge.    Follow Up Recommendations SNF    Equipment Recommendations  Rolling walker with 5" wheels    Recommendations for Other Services Rehab consult     Precautions / Restrictions Precautions Precautions: Fall;Knee (protect knee due to pain and effusion, injection planned) Precaution Booklet Issued: No Restrictions Weight Bearing Restrictions: No Other Position/Activity Restrictions: Has stitches on L foot from skin torn in the falls      Mobility  Bed Mobility Overal bed mobility: Needs Assistance Bed Mobility: Supine to Sit     Supine to sit: Mod assist;HOB elevated     General bed mobility comments: Pt assisted scooting out to EOB and was fearful of increasing L knee pain  Transfers Overall transfer level: Needs assistance Equipment used: Rolling walker (2 wheeled);2 person hand held assist Transfers: Sit to/from [facility] Sit to Stand: Mod assist;+2 physical assistance;+2 safety/equipment Stand pivot transfers: Mod assist;+2 physical assistance;+2 safety/equipment       General transfer comment: Pt was in need of encouragement and had planned to scoot over to chair but then stood again on  walker  Ambulation/Gait             General Gait Details: unable to step  Stairs            Wheelchair Mobility    Modified Rankin (Stroke Patients Only) Modified Rankin (Stroke Patients Only) Pre-Morbid Rankin Score: No significant disability Modified Rankin: Moderately severe disability     Balance Overall balance assessment: Needs assistance Sitting-balance support: Feet supported Sitting balance-Leahy Scale: Good     Standing balance support: Bilateral upper extremity supported Standing balance-Leahy Scale: Fair                               Pertinent Vitals/Pain Pain Assessment: Faces Pain Score: 8  Faces Pain Scale: Hurts whole lot Pain Location: L knee with standing on meds Pain Descriptors / Indicators: Aching;Cramping Pain Intervention(s): Monitored during session;Premedicated before session;Limited activity within patient's tolerance;Repositioned;Other (comment) (orthopedic surgeon stepped in to consult on his knee pain)    Home Living Family/patient expects to be discharged to:: Skilled nursing facility Living Arrangements: Spouse/significant other                    Prior Function Level of Independence: Independent               Hand Dominance        Extremity/Trunk Assessment   Upper Extremity Assessment: Overall WFL for tasks assessed           Lower Extremity Assessment: Generalized weakness  Cervical / Trunk Assessment: Normal  Communication   Communication: No difficulties  Cognition Arousal/Alertness: Awake/alert Behavior During Therapy: Anxious;WFL for tasks assessed/performed Overall Cognitive Status: Within Functional Limits for tasks assessed                      General Comments General comments (skin integrity, edema, etc.): Pt was able to control standing once up but transition is difficult mainly due to L kinee pain    Exercises        Assessment/Plan    PT  Assessment Patient needs continued PT services  PT Diagnosis Difficulty walking;Acute pain   PT Problem List Decreased strength;Decreased range of motion;Decreased activity tolerance;Decreased balance;Decreased mobility;Decreased coordination;Decreased knowledge of use of DME;Cardiopulmonary status limiting activity;Obesity;Decreased skin integrity;Pain  PT Treatment Interventions DME instruction;Gait training;Functional mobility training;Therapeutic activities;Therapeutic exercise;Balance training;Neuromuscular re-education;Patient/family education   PT Goals (Current goals can be found in the Care Plan section) Acute Rehab PT Goals Patient Stated Goal: to get knee pain better and get home PT Goal Formulation: With patient/family Time For Goal Achievement: 05/12/15 Potential to Achieve Goals: Good    Frequency 7X/week   Barriers to discharge Decreased caregiver support (2 person assist for transfers) Pt is not at home care level unless knee inject relieves pain completely    Co-evaluation               End of Session   Activity Tolerance: Patient tolerated treatment well;Patient limited by pain Patient left: in chair;with call bell/phone within reach;with family/visitor present;Other (comment) (pt cannot stand and verbalizes he will ask nurse to go to be) Nurse Communication: Mobility status;Other (comment) (inserviced transfer to bed)         Time: 2763-9432 PT Time Calculation (min) (ACUTE ONLY): 39 min   Charges:   PT Evaluation $PT Eval Moderate Complexity: 1 Procedure PT Treatments $Therapeutic Exercise: 8-22 mins $Therapeutic Activity: 8-22 mins   PT G Codes:        Ramond Dial 05-21-2015, 10:44 AM  Mee Hives, PT MS Acute Rehab Dept. Number: [facility] [id number] and [place] [phone]

## 2015-04-29 ENCOUNTER — Inpatient Hospital Stay
Admit: 2015-04-29 | Discharge: 2015-04-29 | Disposition: A | Discharge status: Home / Self Care | Payer: BLUE CROSS/BLUE SHIELD | Attending: Internal Medicine | Admitting: Internal Medicine

## 2015-04-29 DIAGNOSIS — R2681 Unsteadiness on feet: Secondary | ICD-10-CM

## 2015-04-29 LAB — BASIC METABOLIC PANEL
ANION GAP: 8 (ref 5–15)
BUN: 23 mg/dL — AB (ref 6–20)
CO2: 24 mmol/L (ref 22–32)
Calcium: 9.1 mg/dL (ref 8.9–10.3)
Chloride: 105 mmol/L (ref 101–111)
Creatinine, Ser: 1.14 mg/dL (ref 0.61–1.24)
GFR calc Af Amer: >60 mL/min (ref >60)
Glucose, Bld: 131 mg/dL — ABNORMAL HIGH (ref 65–99)
POTASSIUM: 4.6 mmol/L (ref 3.5–5.1)
SODIUM: 137 mmol/L (ref 135–145)

## 2015-04-29 LAB — CBC
HCT: 38.9 % — ABNORMAL LOW (ref 40.0–52.0)
Hemoglobin: 13.2 g/dL (ref 13.0–18.0)
MCH: 30.2 pg (ref 26.0–34.0)
MCHC: 33.9 g/dL (ref 32.0–36.0)
MCV: 89 fL (ref 80.0–100.0)
PLATELETS: 159 10*3/uL (ref 150–440)
RBC: 4.38 MIL/uL — AB (ref 4.40–5.90)
RDW: 14.8 % — ABNORMAL HIGH (ref 11.5–14.5)
WBC: 9.6 10*3/uL (ref 3.8–10.6)

## 2015-04-29 MED ORDER — DOCUSATE SODIUM 100 MG PO CAPS: 100.0000 mg | ORAL_CAPSULE | Freq: Two times a day (BID) | ORAL | Status: DC

## 2015-04-29 MED ORDER — DIAZEPAM 10 MG PO TABS: 10.0000 mg | ORAL_TABLET | Freq: Three times a day (TID) | ORAL | Status: DC | PRN

## 2015-04-29 MED ORDER — ASPIRIN 81 MG PO TBEC: 81.0000 mg | DELAYED_RELEASE_TABLET | Freq: Every day | ORAL | Status: AC

## 2015-04-29 MED ORDER — ZOLPIDEM TARTRATE 10 MG PO TABS: 10.0000 mg | ORAL_TABLET | Freq: Every evening | ORAL | Status: DC | PRN

## 2015-04-29 MED ORDER — CLINDAMYCIN HCL 300 MG PO CAPS: 300.0000 mg | ORAL_CAPSULE | Freq: Three times a day (TID) | ORAL | Status: DC

## 2015-04-29 MED ORDER — OXYCODONE HCL 5 MG PO TABS: 10.0000 mg | ORAL_TABLET | ORAL | Status: DC | PRN

## 2015-04-29 NOTE — Consult Note (Signed)
Reason for Consult:Falls Referring Physician: Sudini  CC: Falls  HPI: Ernest Sims is an 60 y.o. male presenting 2/24 after a fall.  Patient reports that he has had an increase in falls recently.  Reports that at baseline he has an altered gait due to his arthritis.  He also has a history of falls.  They do not fit any particular stereotypical pattern.  He has had some where he was going up stairs and fell backward.  He has had them going down stairs and falling forward.  He has had times when he just stands and goes down.  He reports at times when he is leaned forward he will just keep going forward and can not stop himself.  With his presenting fall the patient was getting up from the toilet and fell forward unable to stop himself.  He injured his knee and foot and now his gait is further impaired.  Patient does not report any loss of consciousness.  No significant tremor.    Past Medical History  Diagnosis Date  . Hypertension   . Migraines   . Anxiety   . Seasonal allergies   . COPD (chronic obstructive pulmonary disease) [facility])     Past Surgical History  Procedure Laterality Date  . Ruptured disc  1984  . Revision total hip arthroplasty  2003  . Colonoscopy with propofol N/A 02/28/2015    Procedure: COLONOSCOPY WITH PROPOFOL;  Surgeon: Manya Silvas, MD;  Location: [facility] ENDOSCOPY;  Service: Endoscopy;  Laterality: N/A;  . Esophagogastroduodenoscopy (egd) with propofol N/A 02/28/2015    Procedure: ESOPHAGOGASTRODUODENOSCOPY (EGD) WITH PROPOFOL;  Surgeon: Manya Silvas, MD;  Location: [facility] ENDOSCOPY;  Service: Endoscopy;  Laterality: N/A;    Family History  Problem Relation Age of Onset  . Lung disease Father   . Lung disease Mother   . Heart disease Father   . Cancer Brother     liver  . Cancer Maternal Aunt     Social History:  reports that he quit smoking about 27 years ago. His smoking use included Cigarettes. He has a 72 pack-year smoking history. He has never  used smokeless tobacco. He reports that he drinks alcohol. His drug history is not on file.  Allergies  Allergen Reactions  . Aspirin Nausea And Vomiting  . Etodolac Nausea And Vomiting  . Penicillins Other (See Comments)    Reaction:  Unknown     Medications:  I have reviewed the patient's current medications. Prior to Admission:  Prescriptions prior to admission  Medication Sig Dispense Refill Last Dose  . allopurinol (ZYLOPRIM) 300 MG tablet Take 300 mg by mouth as needed.    PRN at PRN  . butalbital-acetaminophen-caffeine (FIORICET, ESGIC) 50-325-40 MG per tablet Take 1 tablet by mouth every 4 (four) hours as needed for headache.   04/26/2015 at Unknown time  . diazepam (VALIUM) 10 MG tablet Take 10 mg by mouth every 6 (six) hours as needed for anxiety.   04/26/2015 at Unknown time  . Fluticasone-Salmeterol (ADVAIR) 250-50 MCG/DOSE AEPB Inhale 1 puff into the lungs 2 (two) times daily.   04/26/2015 at Unknown time  . latanoprost (XALATAN) 0.005 % ophthalmic solution Place 1 drop into both eyes at bedtime.   04/25/2015 at Unknown time  . olmesartan (BENICAR) 20 MG tablet Take 20 mg by mouth daily.   04/26/2015 at Unknown time  . omeprazole (PRILOSEC) 40 MG capsule Take 40 mg by mouth daily.   02/28/2015 at 530am  . oxyCODONE (  OXY IR/ROXICODONE) 5 MG immediate release tablet Take 5-10 tablets by mouth every 4 (four) hours as needed for severe pain.   0 PRN at PRN  . pravastatin (PRAVACHOL) 40 MG tablet Take 40 mg by mouth daily.   04/25/2015 at Unknown time  . propranolol (INDERAL) 40 MG tablet Take 40 mg by mouth 2 (two) times daily.   04/26/2015 at Unknown time  . pseudoephedrine (SUDAFED) 60 MG tablet Take 60 mg by mouth 2 (two) times daily.   04/26/2015 at Unknown time  . timolol (TIMOPTIC) 0.5 % ophthalmic solution Place 1 drop into both eyes 2 (two) times daily.   04/26/2015 at Unknown time  . triamcinolone (NASACORT) 55 MCG/ACT AERO nasal inhaler Place 2 sprays into the nose 2 (two) times  daily as needed.    04/26/2015 at Unknown time  . zolpidem (AMBIEN) 10 MG tablet Take 10 mg by mouth at bedtime as needed for sleep.   04/25/2015 at Unknown time   Scheduled: . aspirin EC  81 mg Oral Daily  . clindamycin  300 mg Oral 3 times per day  . docusate sodium  100 mg Oral BID  . enoxaparin (LOVENOX) injection  40 mg Subcutaneous BID  . irbesartan  150 mg Oral Daily  . latanoprost  1 drop Both Eyes QHS  . pantoprazole  40 mg Oral Daily  . polyethylene glycol  17 g Oral Daily  . pravastatin  40 mg Oral Daily  . propranolol  40 mg Oral BID  . sodium chloride flush  3 mL Intravenous Q12H  . sodium chloride flush  3 mL Intravenous Q12H  . timolol  1 drop Both Eyes BID    ROS: History obtained from the patient  General ROS: negative for - chills, fatigue, fever, night sweats, weight gain or weight loss Psychological ROS: negative for - behavioral disorder, hallucinations, memory difficulties, mood swings or suicidal ideation Ophthalmic ROS: negative for - blurry vision, double vision, eye pain or loss of vision ENT ROS: negative for - epistaxis, nasal discharge, oral lesions, sore throat, tinnitus or vertigo Allergy and Immunology ROS: negative for - hives or itchy/watery eyes Hematological and Lymphatic ROS: negative for - bleeding problems, bruising or swollen lymph nodes Endocrine ROS: negative for - galactorrhea, hair pattern changes, polydipsia/polyuria or temperature intolerance Respiratory ROS: negative for - cough, hemoptysis, shortness of breath or wheezing Cardiovascular ROS: negative for - chest pain, dyspnea on exertion, edema or irregular heartbeat Gastrointestinal ROS: negative for - abdominal pain, diarrhea, hematemesis, nausea/vomiting or stool incontinence Genito-Urinary ROS: negative for - dysuria, hematuria, incontinence or urinary frequency/urgency Musculoskeletal ROS: joint pain, left knee swelling Neurological ROS: as noted in HPI Dermatological ROS: negative  for rash and skin lesion changes  Physical Examination: Blood pressure 128/60, pulse 70, temperature 98.6 F (37 C), temperature source Oral, resp. rate 16, height 5' 10"  (1.778 m), weight 138.529 kg (305 lb 6.4 oz), SpO2 97 %.  HEENT-  Normocephalic, no lesions, without obvious abnormality.  Normal external eye and conjunctiva.  Normal TM's bilaterally.  Normal auditory canals and external ears. Normal external nose, mucus membranes and septum.  Normal pharynx. Cardiovascular- S1, S2 normal, pulses palpable throughout   Lungs- chest clear, no wheezing, rales, normal symmetric air entry Abdomen- soft, non-tender; bowel sounds normal; no masses,  no organomegaly Extremities- mild lower extremity edema Lymph-no adenopathy palpable Musculoskeletal-swelling left knee and right ankle tenderness Skin-warm and dry, no hyperpigmentation, vitiligo, or suspicious lesions  Neurological Examination Mental Status: Alert, oriented, thought  content appropriate.  Speech fluent without evidence of aphasia.  Able to follow 3 step commands without difficulty. Cranial Nerves: II: Discs flat bilaterally; Visual fields grossly normal, pupils equal, round, reactive to light and accommodation III,IV, VI: ptosis not present, extra-ocular motions intact bilaterally V,VII: smile symmetric, facial light touch sensation normal bilaterally VIII: hearing normal bilaterally IX,X: gag reflex present XI: bilateral shoulder shrug XII: midline tongue extension Motor: Right : Upper extremity   5/5    Left:     Upper extremity   5/5  Lower extremity   5/5     Lower extremity   4-/5 due to pain Tone and bulk:normal tone throughout; no atrophy noted Sensory: Pinprick and light touch intact throughout, bilaterally.  Decreased vibratory sensation in the right foot Deep Tendon Reflexes: 2+ in the upper extremities.  Not tested in the lower extremities due to pain  Plantars: Right: downgoing   Left:  downgoing Cerebellar: Normal finger-to-nose testing bilaterally Gait: not tested due to left knee pain and stitches on left foot   Laboratory Studies:   Basic Metabolic Panel:  Recent Labs Lab 04/26/15 1752 04/29/15 0535  NA 140 137  K 4.7 4.6  CL 112* 105  CO2 22 24  GLUCOSE 103* 131*  BUN 29* 23*  CREATININE 1.21 1.14  CALCIUM 8.8* 9.1    Liver Function Tests: No results for input(s): AST, ALT, ALKPHOS, BILITOT, PROT, ALBUMIN in the last 168 hours. No results for input(s): LIPASE, AMYLASE in the last 168 hours. No results for input(s): AMMONIA in the last 168 hours.  CBC:  Recent Labs Lab 04/26/15 1752 04/29/15 0535  WBC 7.1 9.6  HGB 15.2 13.2  HCT 45.9 38.9*  MCV 91.2 89.0  PLT 182 159    Cardiac Enzymes:  Recent Labs Lab 04/26/15 1752 04/26/15 2310 04/27/15 0445  TROPONINI <0.03 <0.03 <0.03    BNP: Invalid input(s): POCBNP  CBG: No results for input(s): GLUCAP in the last 168 hours.  Microbiology: No results found for this or any previous visit.  Coagulation Studies: No results for input(s): LABPROT, INR in the last 72 hours.  Urinalysis:  Recent Labs Lab 04/27/15 0410  COLORURINE YELLOW*  LABSPEC 1.021  PHURINE 5.0  GLUCOSEU NEGATIVE  HGBUR NEGATIVE  BILIRUBINUR NEGATIVE  KETONESUR NEGATIVE  PROTEINUR NEGATIVE  NITRITE NEGATIVE  LEUKOCYTESUR NEGATIVE    Lipid Panel:     Component Value Date/Time   CHOL 172 04/27/2015 0445   TRIG 297* 04/27/2015 0445   HDL 40* 04/27/2015 0445   CHOLHDL 4.3 04/27/2015 0445   VLDL 59* 04/27/2015 0445   LDLCALC 73 04/27/2015 0445    HgbA1C: No results found for: HGBA1C  Urine Drug Screen:  No results found for: LABOPIA, COCAINSCRNUR, LABBENZ, AMPHETMU, THCU, LABBARB  Alcohol Level: No results for input(s): ETH in the last 168 hours.  Other results: EKG: accelerated junctional rhythm at 66 bpm.  Imaging: Mr Brain Wo Contrast  04/27/2015  CLINICAL DATA:  Multiple falls over the past 3  days. Progressive gait abnormality. EXAM: MRI HEAD WITHOUT CONTRAST TECHNIQUE: Multiplanar, multiecho pulse sequences of the brain and surrounding structures were obtained without intravenous contrast. COMPARISON:  CT head 04/26/2015. FINDINGS: No evidence for acute infarction, hemorrhage, mass lesion, hydrocephalus, or extra-axial fluid. Slight premature for age cerebral and cerebellar atrophy. No white matter disease of significance. Specifically, the area questioned in the LEFT parieto-occipital region is normal. Partial empty sella. No tonsillar herniation. Upper cervical region appears unremarkable. Flow voids are maintained.  No  chronic hemorrhage is identified. The visualized extracranial soft tissues demonstrate chronic sinusitis involving the frontal, maxillary, and ethmoid regions. Layering fluid is seen in the smaller RIGHT division of the sphenoid. No mastoid fluid or nasopharyngeal abnormality. No osseous findings of significance. IMPRESSION: Slight premature for age atrophy.  No acute intracranial findings. The area questioned on prior CT in LEFT parietal occipital white matter is normal. Chronic and acute sinusitis. Electronically Signed   By: Staci Righter M.D.   On: 04/27/2015 16:11   Mr Knee Left  Wo Contrast  04/27/2015  CLINICAL DATA:  Recent falls over the last few days. Left knee pain with limited range of motion. EXAM: MRI OF THE LEFT KNEE WITHOUT CONTRAST TECHNIQUE: Multiplanar, multisequence MR imaging of the knee was performed. No intravenous contrast was administered. COMPARISON:  04/26/2015 FINDINGS: Despite efforts by the technologist and patient, severe motion artifact is present on today's exam and could not be eliminated. This reduces exam sensitivity and specificity. Generally poor signal to noise ratio. MENISCI Medial meniscus:  Unremarkable Lateral meniscus: Indistinct and highly attenuated posterior horn and midbody suspicious for degenerative tearing, possible radial tear  posterolaterally. The motion artifact adversely affects positive predictive value for tear. LIGAMENTS Cruciates:  Unremarkable Collaterals:  Thickened MCL with surrounding edema. CARTILAGE Patellofemoral: Moderate chondral thinning with mild chondral irregularity and marginal spurring. Medial: Moderate to prominent degenerative chondral thinning with marginal spurring. Lateral: Severe chondral thinning with marginal spurring and some peripheral extrusion of the meniscus. Joint:  Large knee joint effusion with synovitis. Popliteal Fossa: 5.3 by 2.8 by 3.0 cm popliteal cyst, extending anterolateral to the medial head gastrocnemius rather than in the characteristic location for a Baker 's cyst. Extensor Mechanism: Mild proximal patellar tendinopathy. Edema in the subcutaneous tissues superficial to the patellar retinacula. Bones: No significant extra-articular osseous abnormalities identified. IMPRESSION: 1. Severe motion artifact reduces diagnostic accuracy. 2. Suspected degenerative tearing in the posterior horn and midbody lateral meniscus. 3. Variable but generally severe osteoarthritis. 4. Thickened MCL with surrounding edema, possibly MCL sprain. 5. Large knee joint effusion with synovitis. 6. Moderate-sized but atypically located popliteal cyst, anterolateral to the medial head gastrocnemius. 7. Mild proximal patellar tendinopathy. Electronically Signed   By: Van Clines M.D.   On: 04/27/2015 15:48   US Carotid Bilateral  04/27/2015  CLINICAL DATA:  CVA. EXAM: BILATERAL CAROTID DUPLEX ULTRASOUND TECHNIQUE: Pearline Cables scale imaging, color Doppler and duplex ultrasound were performed of bilateral carotid and vertebral arteries in the neck. COMPARISON:  None. FINDINGS: Criteria: Quantification of carotid stenosis is based on velocity parameters that correlate the residual internal carotid diameter with NASCET-based stenosis levels, using the diameter of the distal internal carotid lumen as the denominator for  stenosis measurement. The following velocity measurements were obtained: RIGHT ICA:  115 cm/sec CCA:  185 cm/sec SYSTOLIC ICA/CCA RATIO:  0.9 DIASTOLIC ICA/CCA RATIO:  1.4 ECA:  118 cm/sec LEFT ICA:  67 cm/sec CCA:  631 cm/sec SYSTOLIC ICA/CCA RATIO:  0.5 DIASTOLIC ICA/CCA RATIO:  0.8 ECA:  111 cm/sec RIGHT CAROTID ARTERY: Right carotid arteries are patent without significant plaque or stenosis. Normal waveforms and velocities in the right internal carotid artery. RIGHT VERTEBRAL ARTERY: Antegrade flow and normal waveform in the right vertebral artery. LEFT CAROTID ARTERY: Minimal plaque at the left carotid bulb. Minimal plaque at the origin of the left internal carotid artery. Normal waveforms and velocities in the left internal carotid artery without significant stenosis. LEFT VERTEBRAL ARTERY: Antegrade flow and normal waveform in the left vertebral artery.  IMPRESSION: Minimal plaque at the left carotid bulb region. No significant carotid artery stenosis. Patent vertebral arteries. Electronically Signed   By: Markus Daft M.D.   On: 04/27/2015 11:03     Assessment/Plan: 60 year old male presenting with multiple falls.  Many different scenarios presented but none appear to be related to a gait ataxia.  MRI of the brain personally reviewed and shows no acute changes to suggest infarct.  Likely with instability related to arthritis.  Patient also with multiple medications that may affect reaction time and stability including Ambien and Valium.  Would rule out orthostasis as well.    Recommendations: 1.  Orthostatic BP with heart rate.    Alexis Goodell, MD Neurology [phone] 04/29/2015, 10:20 AM

## 2015-04-29 NOTE — Progress Notes (Addendum)
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      PT Comments    Pt demonstrating much improved progress with all exercises and functional mobility today. Pt requires less assist from Mod of 2 to Min A of 1 today. Improved ability to take steps for short distance. Pt requires some assist on Left lower extremity with exercises initially, but improves with continued repetitions. Continue PT for progression of range of motion and strength in Bilateral lower extremities and improve all functional mobility.   Follow Up Recommendations  SNF     Equipment Recommendations  Rolling walker with 5" wheels    Recommendations for Other Services       Precautions / Restrictions Precautions Precautions: Fall;Knee Precaution Booklet Issued: No Restrictions Weight Bearing Restrictions: No Other Position/Activity Restrictions: Stitches L foot under base of toes    Mobility  Bed Mobility Overal bed mobility: Needs Assistance Bed Mobility: Supine to Sit     Supine to sit: HOB elevated;Modified independent (Device/Increase time) (use of rails and increased time)     General bed mobility comments: Much improved; uses rails and increased time/effort to sit, but does so without phyusical assist  Transfers Overall transfer level: Needs assistance Equipment used: Rolling walker (2 wheeled);2 person hand held assist Transfers: Sit to/from Stand Sit to Stand: Min assist         General transfer comment: Slow to rise; weight shift right to left before walking   Ambulation/Gait Ambulation/Gait assistance: Min guard Ambulation Distance (Feet): 5 Feet Assistive device: Rolling walker (2 wheeled) Gait  Pattern/deviations: Step-to pattern Gait velocity: slow Gait velocity interpretation: <1.8 ft/sec, indicative of risk for recurrent falls General Gait Details: slow guarded steps; much improved ability to take steps   Stairs            Wheelchair Mobility    Modified Rankin (Stroke Patients Only)       Balance Overall balance assessment: Needs assistance         Standing balance support: Bilateral upper extremity supported Standing balance-Leahy Scale: Fair                      Cognition Arousal/Alertness: Awake/alert Behavior During Therapy: WFL for tasks assessed/performed Overall Cognitive Status: Within Functional Limits for tasks assessed                      Exercises General Exercises - Lower Extremity Ankle Circles/Pumps: AROM;Both;20 reps;Supine Quad Sets: Strengthening;Both;20 reps;Supine Gluteal Sets: Strengthening;Both;20 reps;Supine Short Arc Quad: AROM;Both;20 reps;Supine (initally AAROM on L; improves) Long Arc Quad: AAROM;Left;20 reps;Seated (AROM R) Heel Slides: AAROM;20 reps;Supine;Left (R AROM) Hip ABduction/ADduction: AROM;Both;20 reps;Supine (RLE held under ankle to avoid shearing resist) Straight Leg Raises: AAROM;Both;20 reps;Supine Hip Flexion/Marching: AROM;Both;20 reps;Seated    General Comments General comments (skin integrity, edema, etc.): LLE warm to the touch      Pertinent Vitals/Pain Pain Assessment: 0-10 Pain Score: 7  Pain Location: L knee (L foot 8/10 and R knee 6/10) Pain Intervention(s): Monitored during session;Limited activity within patient's tolerance;Ice applied (Ice to R knee)    Home Living  Prior Function            PT Goals (current goals can now be found in the care plan section) Progress towards PT goals: Progressing toward goals (slowly)    Frequency  7X/week    PT Plan Current plan remains appropriate    Co-evaluation             End of  Session   Activity Tolerance: Patient limited by pain       Time: 1000-1044 PT Time Calculation (min) (ACUTE ONLY): 44 min  Charges:  $Gait Training: 8-22 mins $Therapeutic Exercise: 23-37 mins                    G Codes:      Charlaine Dalton, PTA 04/29/2015, 10:59 AM

## 2015-04-29 NOTE — Progress Notes (Signed)
*  PRELIMINARY RESULTS* Echocardiogram 2D Echocardiogram has been performed.  Ernest Sims 04/29/2015, 11:30 AM

## 2015-04-29 NOTE — Discharge Instructions (Signed)
°  DIET:  Cardiac diet  DISCHARGE CONDITION:  Stable  ACTIVITY:  Activity as tolerated  OXYGEN:  Home Oxygen: No.   Oxygen Delivery: room air  DISCHARGE LOCATION:  nursing home   If you experience worsening of your admission symptoms, develop shortness of breath, life threatening emergency, suicidal or homicidal thoughts you must seek medical attention immediately by calling 911 or calling your MD immediately  if symptoms less severe.  You Must read complete instructions/literature along with all the possible adverse reactions/side effects for all the Medicines you take and that have been prescribed to you. Take any new Medicines after you have completely understood and accpet all the possible adverse reactions/side effects.   Please note  You were cared for by a hospitalist during your hospital stay. If you have any questions about your discharge medications or the care you received while you were in the hospital after you are discharged, you can call the unit and asked to speak with the hospitalist on call if the hospitalist that took care of you is not available. Once you are discharged, your primary care physician will handle any further medical issues. Please note that NO REFILLS for any discharge medications will be authorized once you are discharged, as it is imperative that you return to your primary care physician (or establish a relationship with a primary care physician if you do not have one) for your aftercare needs so that they can reassess your need for medications and monitor your lab values.

## 2015-04-29 NOTE — NC FL2 (Signed)
[place] LEVEL OF CARE SCREENING TOOL     IDENTIFICATION  Patient Name: Ernest Sims Birthdate: 08/21/1955 Sex: male Admission Date (Current Location): 04/26/2015  [place] and [place] Number:  Engineering geologist and Address:  [facility], [address] [place], [place] [address]      Provider Number: [id number]  Attending Physician Name and Address:  Hillary Bow, MD  Relative Name and Phone Number:       Current Level of Care: Hospital Recommended Level of Care: [place] Prior Approval Number:    Date Approved/Denied:   PASRR Number:    Discharge Plan: SNF    Current Diagnoses: Patient Active Problem List   Diagnosis Date Noted  . CVA (cerebral infarction) 04/26/2015  . Acid reflux 07/12/2014  . Hoarseness 07/12/2014  . Shortness of breath 05/22/2014  . Morbid obesity ([place]) 05/22/2014  . Physical deconditioning 05/22/2014    Orientation RESPIRATION BLADDER Height & Weight     Self, Time, Situation, Place  Normal Continent Weight: (!) 305 lb 6.4 oz (138.529 kg) Height:  5' 10"  (177.8 cm)  BEHAVIORAL SYMPTOMS/MOOD NEUROLOGICAL BOWEL NUTRITION STATUS      Continent Diet (Heart Healthy)  AMBULATORY STATUS COMMUNICATION OF NEEDS Skin   Extensive Assist Verbally Normal                       Personal Care Assistance Level of Assistance  Bathing, Feeding, Dressing Bathing Assistance: Limited assistance Feeding assistance: Independent Dressing Assistance: Limited assistance     Functional Limitations Info  Sight, Hearing, Speech Sight Info: Adequate Hearing Info: Adequate Speech Info: Adequate    SPECIAL CARE FACTORS FREQUENCY  PT (By licensed PT)     PT Frequency: 5              Contractures Contractures Info: Not present    Additional Factors Info  Code Status, Allergies Code Status Info: Full Code Allergies Info: Allergies: Aspirin, Etodolac, Penicillins            Current Medications (04/29/2015):  This is the current hospital active medication list Current Facility-Administered Medications  Medication Dose Route Frequency Provider Last Rate Last Dose  . acetaminophen (TYLENOL) tablet 650 mg  650 mg Oral Q6H PRN Hillary Bow, MD   650 mg at 04/27/15 1547   Or  . acetaminophen (TYLENOL) suppository 650 mg  650 mg Rectal Q6H PRN Hillary Bow, MD      . aspirin EC tablet 81 mg  81 mg Oral Daily Hillary Bow, MD   81 mg at 04/29/15 0175  . butalbital-acetaminophen-caffeine (FIORICET, ESGIC) 50-325-40 MG per tablet 1 tablet  1 tablet Oral Q4H PRN Srikar Sudini, MD      . clindamycin (CLEOCIN) capsule 300 mg  300 mg Oral 3 times per day Hessie Knows, MD   300 mg at 04/29/15 1320  . diazepam (VALIUM) tablet 10 mg  10 mg Oral Q6H PRN Hillary Bow, MD   10 mg at 04/28/15 0411  . docusate sodium (COLACE) capsule 100 mg  100 mg Oral BID Bettey Costa, MD   100 mg at 04/29/15 0852  . enoxaparin (LOVENOX) injection 40 mg  40 mg Subcutaneous BID Hillary Bow, MD   40 mg at 04/29/15 1025  . HYDROmorphone (DILAUDID) injection 1 mg  1 mg Intravenous Q3H PRN Bettey Costa, MD   1 mg at 04/29/15 0510  . ipratropium-albuterol (DUONEB) 0.5-2.5 (3) MG/3ML nebulizer solution 3 mL  3  mL Nebulization Q4H PRN Hillary Bow, MD      . irbesartan (AVAPRO) tablet 150 mg  150 mg Oral Daily Bettey Costa, MD   150 mg at 04/29/15 0852  . latanoprost (XALATAN) 0.005 % ophthalmic solution 1 drop  1 drop Both Eyes QHS Hillary Bow, MD   1 drop at 04/28/15 2247  . ondansetron (ZOFRAN) tablet 4 mg  4 mg Oral Q6H PRN Hillary Bow, MD       Or  . ondansetron (ZOFRAN) injection 4 mg  4 mg Intravenous Q6H PRN Srikar Sudini, MD      . oxyCODONE (Oxy IR/ROXICODONE) immediate release tablet 5 mg  5 mg Oral Q4H PRN Hillary Bow, MD   5 mg at 04/29/15 2956  . pantoprazole (PROTONIX) EC tablet 40 mg  40 mg Oral Daily Hillary Bow, MD   40 mg at 04/29/15 0852  . polyethylene glycol (MIRALAX /  GLYCOLAX) packet 17 g  17 g Oral Daily PRN Srikar Sudini, MD      . polyethylene glycol (MIRALAX / GLYCOLAX) packet 17 g  17 g Oral Daily Bettey Costa, MD   17 g at 04/29/15 2130  . pravastatin (PRAVACHOL) tablet 40 mg  40 mg Oral Daily Bettey Costa, MD   40 mg at 04/29/15 0852  . propranolol (INDERAL) tablet 40 mg  40 mg Oral BID Hillary Bow, MD   40 mg at 04/29/15 8657  . sodium chloride flush (NS) 0.9 % injection 3 mL  3 mL Intravenous Q12H Hillary Bow, MD   3 mL at 04/28/15 0845  . timolol (TIMOPTIC) 0.5 % ophthalmic solution 1 drop  1 drop Both Eyes BID Hillary Bow, MD   1 drop at 04/29/15 1320  . zolpidem (AMBIEN) tablet 10 mg  10 mg Oral QHS PRN Hillary Bow, MD   10 mg at 04/27/15 2245     Discharge Medications: Please see discharge summary for a list of discharge medications.  Relevant Imaging Results:  Relevant Lab Results:   Additional Information SSN:  [id number]  Darden Dates, LCSW

## 2015-04-29 NOTE — Clinical Social Work Placement (Signed)
   CLINICAL SOCIAL WORK PLACEMENT  NOTE  Date:  04/29/2015  Patient Details  Name: Ernest Sims MRN: [id number] Date of Birth: 1955-07-20  Clinical Social Work is seeking post-discharge placement for this patient at the [place] level of care (*CSW will initial, date and re-position this form in  chart as items are completed):  Yes   Patient/family provided with [facility] Work Department's list of facilities offering this level of care within the geographic area requested by the patient (or if unable, by the patient's family).  Yes   Patient/family informed of their freedom to choose among providers that offer the needed level of care, that participate in Medicare, Medicaid or managed care program needed by the patient, have an available bed and are willing to accept the patient.  Yes   Patient/family informed of [facility]'s ownership interest in [facility], as well as of the fact that they are under no obligation to receive care at these facilities.  PASRR submitted to EDS on 04/29/15     PASRR number received on 04/29/15     Existing PASRR number confirmed on       FL2 transmitted to all facilities in geographic area requested by pt/family on 04/29/15     FL2 transmitted to all facilities within larger geographic area on       Patient informed that his/her managed care company has contracts with or will negotiate with certain facilities, including the following:        Yes   Patient/family informed of bed offers received.  Patient chooses bed at [facility]     Physician recommends and patient chooses bed at  [facility])    Patient to be transferred to   on  .  Patient to be transferred to facility by       Patient family notified on   of transfer.  Name of family member notified:        PHYSICIAN       Additional Comment:    _______________________________________________ Darden Dates,  LCSW 04/29/2015, 4:20 PM

## 2015-04-29 NOTE — Clinical Social Work Note (Signed)
Clinical Social Work Assessment  Patient Details  Name: Ernest Sims MRN: [id number] Date of Birth: 1955/07/27  Date of referral:  04/29/15               Reason for consult:  Facility Placement                Permission sought to share information with:  Family Supports Permission granted to share information::  Yes, Verbal Permission Granted  Name::     Thurmon Mizell, wife   Housing/Transportation Living arrangements for the past 2 months:  Single Family Home Source of Information:  Patient, Spouse Patient Interpreter Needed:  None Criminal Activity/Legal Involvement Pertinent to Current Situation/Hospitalization:  No - Comment as needed Significant Relationships:  Spouse, Adult Children Lives with:  Spouse, Adult Children Do you feel safe going back to the place where you live?  Yes Need for family participation in patient care:  No (Coment)  Care giving concerns:  No care giving concerns identified.   Social Worker assessment / plan:   CSW met with pt and wife to address consult. CSW introduced herself and explained role of social work. CSW explained process of discharging to SNF with [employer]. PT is recommending STR at SNF. Pt and wife are in agreement with discharge plan. CSW initiated SNF search and followed up with bed offers. Pt chose [facility]. [employer] Josem Kaufmann has been started. CSW will continue to follow.   Employment status:  Therapist, music:  Managed Care PT Recommendations:  [place] / Referral to community resources:  [place]  Patient/Family's Response to care:  Pt and pt's family was appreciative of CSW support.   Patient/Family's Understanding of and Emotional Response to Diagnosis, Current Treatment, and Prognosis:  Pt and wife understand that pt would benefit from STR at [facility].   Emotional Assessment Appearance:  Appears stated age Attitude/Demeanor/Rapport:  Other (Appropriate) Affect (typically  observed):  Accepting, Adaptable, Pleasant Orientation:  Oriented to Self, Oriented to Place, Oriented to  Time, Oriented to Situation Alcohol / Substance use:  Never Used Psych involvement (Current and /or in the community):  No (Comment)  Discharge Needs  Concerns to be addressed:  Adjustment to Illness Readmission within the last 30 days:  No Current discharge risk:  None Barriers to Discharge:  Continued Medical Work up   [employer], LCSW 04/29/2015, 4:14 PM

## 2015-04-29 NOTE — Progress Notes (Signed)
[facility] NAME: Ernest Sims    MR#:  [id number]  DATE OF BIRTH:  02/21/1956  SUBJECTIVE:   Left knee pain better controlled with Dilaudid. PT recommended SNF. S/p left knee steroid injection  REVIEW OF SYSTEMS:    Review of Systems  Constitutional: Negative for fever, chills and malaise/fatigue.  HENT: Negative for sore throat.   Eyes: Positive for double vision. Negative for blurred vision.  Respiratory: Negative for cough, hemoptysis, shortness of breath and wheezing.   Cardiovascular: Negative for chest pain, palpitations and leg swelling.  Gastrointestinal: Negative for nausea, vomiting, abdominal pain, diarrhea and blood in stool.  Genitourinary: Negative for dysuria.  Musculoskeletal: Positive for joint pain (swellng left knee pain better) and falls. Negative for back pain.  Neurological: Negative for dizziness, tremors and headaches.  Endo/Heme/Allergies: Does not bruise/bleed easily.    DRUG ALLERGIES:   Allergies  Allergen Reactions  . Aspirin Nausea And Vomiting  . Etodolac Nausea And Vomiting  . Penicillins Other (See Comments)    Reaction:  Unknown     VITALS:  Blood pressure 127/98, pulse 74, temperature 98 F (36.7 C), temperature source Oral, resp. rate 18, height 5' 10"  (1.778 m), weight 138.529 kg (305 lb 6.4 oz), SpO2 100 %.  PHYSICAL EXAMINATION:   Physical Exam  Constitutional: He is oriented to person, place, and time and well-developed, well-nourished, and in no distress. No distress.  HENT:  Head: Normocephalic.  Eyes: No scleral icterus.  Neck: Normal range of motion. Neck supple. No JVD present. No tracheal deviation present.  Cardiovascular: Normal rate, regular rhythm and normal heart sounds.  Exam reveals no gallop and no friction rub.   No murmur heard. Pulmonary/Chest: Effort normal and breath sounds normal. No respiratory distress. He has no wheezes. He has no rales. He  exhibits no tenderness.  Abdominal: Soft. Bowel sounds are normal. He exhibits no distension and no mass. There is no tenderness. There is no rebound and no guarding.  Musculoskeletal: Normal range of motion. He exhibits edema (left knee with swelling).  Neurological: He is alert and oriented to person, place, and time.  Skin: Skin is warm. No rash noted. No erythema.  Psychiatric: Affect and judgment normal.      LABORATORY PANEL:   CBC  Recent Labs Lab 04/29/15 0535  WBC 9.6  HGB 13.2  HCT 38.9*  PLT 159   ------------------------------------------------------------------------------------------------------------------  Chemistries   Recent Labs Lab 04/29/15 0535  NA 137  K 4.6  CL 105  CO2 24  GLUCOSE 131*  BUN 23*  CREATININE 1.14  CALCIUM 9.1   ------------------------------------------------------------------------------------------------------------------  Cardiac Enzymes  Recent Labs Lab 04/26/15 1752 04/26/15 2310 04/27/15 0445  TROPONINI <0.03 <0.03 <0.03   ------------------------------------------------------------------------------------------------------------------  RADIOLOGY:  Ct Head Wo Contrast  04/26/2015  CLINICAL DATA:  Syncope upon waking for 3 days. EXAM: CT HEAD WITHOUT CONTRAST TECHNIQUE: Contiguous axial images were obtained from the base of the skull through the vertex without intravenous contrast. COMPARISON:  None. FINDINGS: Ill-defined cortical and subcortical hypodensity involving the left posterior temporal occipital lobe, concerning for subacute infarct. No intracranial hemorrhage, mass effect, or midline shift. No hydrocephalus. The basilar cisterns are patent. No intracranial fluid collection. Calvarium is intact. There is mucosal thickening of the right greater than left maxillary sinus, right side of sphenoid sinus, left frontal sinus and ethmoid air cells. The mastoid air cells are well aerated. IMPRESSION: Ill-defined  cortical and subcortical hypodensity involving the  left posterior occipital temporal lobe, concerning for subacute infarct. No significant mass effect or hemorrhage. These results were called by telephone at the time of interpretation on 04/26/2015 at 7:20 pm to Dr. Nance Pear , who verbally acknowledged these results. Electronically Signed   By: Jeb Levering M.D.   On: 04/26/2015 19:21   Mr Brain Wo Contrast  04/27/2015  CLINICAL DATA:  Multiple falls over the past 3 days. Progressive gait abnormality. EXAM: MRI HEAD WITHOUT CONTRAST TECHNIQUE: Multiplanar, multiecho pulse sequences of the brain and surrounding structures were obtained without intravenous contrast. COMPARISON:  CT head 04/26/2015. FINDINGS: No evidence for acute infarction, hemorrhage, mass lesion, hydrocephalus, or extra-axial fluid. Slight premature for age cerebral and cerebellar atrophy. No white matter disease of significance. Specifically, the area questioned in the LEFT parieto-occipital region is normal. Partial empty sella. No tonsillar herniation. Upper cervical region appears unremarkable. Flow voids are maintained.  No chronic hemorrhage is identified. The visualized extracranial soft tissues demonstrate chronic sinusitis involving the frontal, maxillary, and ethmoid regions. Layering fluid is seen in the smaller RIGHT division of the sphenoid. No mastoid fluid or nasopharyngeal abnormality. No osseous findings of significance. IMPRESSION: Slight premature for age atrophy.  No acute intracranial findings. The area questioned on prior CT in LEFT parietal occipital white matter is normal. Chronic and acute sinusitis. Electronically Signed   By: Staci Righter M.D.   On: 04/27/2015 16:11   Mr Knee Left  Wo Contrast  04/27/2015  CLINICAL DATA:  Recent falls over the last few days. Left knee pain with limited range of motion. EXAM: MRI OF THE LEFT KNEE WITHOUT CONTRAST TECHNIQUE: Multiplanar, multisequence MR imaging of the knee  was performed. No intravenous contrast was administered. COMPARISON:  04/26/2015 FINDINGS: Despite efforts by the technologist and patient, severe motion artifact is present on today's exam and could not be eliminated. This reduces exam sensitivity and specificity. Generally poor signal to noise ratio. MENISCI Medial meniscus:  Unremarkable Lateral meniscus: Indistinct and highly attenuated posterior horn and midbody suspicious for degenerative tearing, possible radial tear posterolaterally. The motion artifact adversely affects positive predictive value for tear. LIGAMENTS Cruciates:  Unremarkable Collaterals:  Thickened MCL with surrounding edema. CARTILAGE Patellofemoral: Moderate chondral thinning with mild chondral irregularity and marginal spurring. Medial: Moderate to prominent degenerative chondral thinning with marginal spurring. Lateral: Severe chondral thinning with marginal spurring and some peripheral extrusion of the meniscus. Joint:  Large knee joint effusion with synovitis. Popliteal Fossa: 5.3 by 2.8 by 3.0 cm popliteal cyst, extending anterolateral to the medial head gastrocnemius rather than in the characteristic location for a Baker 's cyst. Extensor Mechanism: Mild proximal patellar tendinopathy. Edema in the subcutaneous tissues superficial to the patellar retinacula. Bones: No significant extra-articular osseous abnormalities identified. IMPRESSION: 1. Severe motion artifact reduces diagnostic accuracy. 2. Suspected degenerative tearing in the posterior horn and midbody lateral meniscus. 3. Variable but generally severe osteoarthritis. 4. Thickened MCL with surrounding edema, possibly MCL sprain. 5. Large knee joint effusion with synovitis. 6. Moderate-sized but atypically located popliteal cyst, anterolateral to the medial head gastrocnemius. 7. Mild proximal patellar tendinopathy. Electronically Signed   By: Van Clines M.D.   On: 04/27/2015 15:48   US Carotid Bilateral  04/27/2015   CLINICAL DATA:  CVA. EXAM: BILATERAL CAROTID DUPLEX ULTRASOUND TECHNIQUE: Pearline Cables scale imaging, color Doppler and duplex ultrasound were performed of bilateral carotid and vertebral arteries in the neck. COMPARISON:  None. FINDINGS: Criteria: Quantification of carotid stenosis is based on velocity parameters that correlate the  residual internal carotid diameter with NASCET-based stenosis levels, using the diameter of the distal internal carotid lumen as the denominator for stenosis measurement. The following velocity measurements were obtained: RIGHT ICA:  115 cm/sec CCA:  836 cm/sec SYSTOLIC ICA/CCA RATIO:  0.9 DIASTOLIC ICA/CCA RATIO:  1.4 ECA:  118 cm/sec LEFT ICA:  67 cm/sec CCA:  629 cm/sec SYSTOLIC ICA/CCA RATIO:  0.5 DIASTOLIC ICA/CCA RATIO:  0.8 ECA:  111 cm/sec RIGHT CAROTID ARTERY: Right carotid arteries are patent without significant plaque or stenosis. Normal waveforms and velocities in the right internal carotid artery. RIGHT VERTEBRAL ARTERY: Antegrade flow and normal waveform in the right vertebral artery. LEFT CAROTID ARTERY: Minimal plaque at the left carotid bulb. Minimal plaque at the origin of the left internal carotid artery. Normal waveforms and velocities in the left internal carotid artery without significant stenosis. LEFT VERTEBRAL ARTERY: Antegrade flow and normal waveform in the left vertebral artery. IMPRESSION: Minimal plaque at the left carotid bulb region. No significant carotid artery stenosis. Patent vertebral arteries. Electronically Signed   By: Markus Daft M.D.   On: 04/27/2015 11:03   Dg Knee Complete 4 Views Left  04/26/2015  CLINICAL DATA:  60 year old who has had syncopal episodes each morning for the past 3 days when he awoke. Left knee pain, possibly related to one of the falls. Initial encounter. EXAM: LEFT KNEE - COMPLETE 4+ VIEW COMPARISON:  None. FINDINGS: No evidence of acute fracture or dislocation. Tricompartment joint space narrowing and associated hypertrophic  spurring, most severe in the medial compartment. Chondrocalcinosis involving the lateral and medial menisci. Bone mineral density well-preserved. Small joint effusion. Thickening of the patellar tendon below the knee. IMPRESSION: 1. No acute osseous abnormality. 2. 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 3. Small joint effusion. 4. Thickening of the patellar tendon, query tendonitis or injury. Electronically Signed   By: Evangeline Dakin M.D.   On: 04/26/2015 20:29     ASSESSMENT AND PLAN:   60 year old male with a known history of essential hypertension and morbid obesity who presented with a fall and gait abnormality.   # Gait abnormalities likely from arthirits \MRI brain showed no CVA Consulted neurology due to CT findings and Discussed with Dr. Doy Mince. Not CVA. No further w/u needed  # Left knee pain: MRI shows no quadricep tendon rupture however does show MCL sprain with synovitis. S/P steroid injection. Continue pain medications. SNF  # Essential hypertension: Continue propanolol and ARB.  # Morbid obesity:  # Hyperlipidemia: Continue Pravachol. LDL 73.  Management plans discussed with the patient and he is in agreement. Also discussed with daughter at bedside  CODE STATUS: FULL  TOTAL TIME TAKING CARE OF THIS PATIENT: 25 minutes.   Hillary Bow R M.D on 04/29/2015 at 2:51 PM  Between 7am to 6pm - Pager - [phone] After 6pm go to [email or web address] - password [email or web address] [facility]  Office  [phone]  CC: Primary care physician; Idelle Crouch, MD  Note: This dictation was prepared with Dragon dictation along with smaller phrase technology. Any transcriptional errors that result from this process are unintentional.

## 2015-04-30 NOTE — Progress Notes (Signed)
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      PT Comments    Pt initially mildly reluctant to PT due to increased pain currently; pt agreeable to try. Pt participates in supine bed exercises and receives pain medication during. Pt notes Bilateral lower extremities feeling looser with exercises. Pt agreeable to ambulation and up in chair, although notes chair is difficult to find a position of comfort. Pt ambulates to bathroom; pt does require increased assist for sit to/from stand from commode versus bed. Pt ambulates to chair post toileting requiring stand by assist only for use of sink for personal hygiene. At the time of this documentation, pt does have discharge orders today to skilled nursing facility to continue progression of strength, endurance and all functional mobility.   Follow Up Recommendations  SNF     Equipment Recommendations  Rolling walker with 5" wheels    Recommendations for Other Services       Precautions / Restrictions Precautions Precautions: Fall;Knee Restrictions Weight Bearing Restrictions: No Other Position/Activity Restrictions: Stitches base of toes/plantar aspect    Mobility  Bed Mobility Overal bed mobility: Modified Independent Bed Mobility: Supine to Sit     Supine to sit: HOB elevated;Modified independent (Device/Increase time)     General bed mobility comments: continues with some increased time/effort  Transfers Overall transfer level: Needs assistance Equipment used: Rolling walker (2 wheeled);2 person hand held assist Transfers: Sit to/from Stand Sit to Stand: Min guard;Min assist (Min A and pull from rail from low commode)         General  transfer comment: Transfers from bed with Min guard; requires Min A and pull technique from rail from low commode  Ambulation/Gait Ambulation/Gait assistance: Min guard Ambulation Distance (Feet): 25 Feet (to bathroom and retrun 30 feet to chair) Assistive device: Rolling walker (2 wheeled) Gait Pattern/deviations: Step-through pattern;Decreased dorsiflexion - right;Decreased dorsiflexion - left;Decreased weight shift to left;Antalgic;Trunk flexed;Wide base of support;Decreased stride length Gait velocity: reduced Gait velocity interpretation: Below normal speed for age/gender General Gait Details: Ambulates with side to side sway reciprocal pattern with decreased step lengths B. Improving overall   Stairs            Wheelchair Mobility    Modified Rankin (Stroke Patients Only)       Balance           Standing balance support: Bilateral upper extremity supported Standing balance-Leahy Scale: Fair                      Cognition Arousal/Alertness: Awake/alert Behavior During Therapy: WFL for tasks assessed/performed Overall Cognitive Status: Within Functional Limits for tasks assessed                      Exercises General Exercises - Lower Extremity Ankle Circles/Pumps: AROM;Both;20 reps;Supine Quad Sets: Strengthening;Both;20 reps;Supine Gluteal Sets: Strengthening;Both;20 reps;Supine Short Arc Quad: AROM;Both;20 reps;Supine Heel Slides: AROM;Both;20 reps;Supine Hip ABduction/ADduction: AROM;Both;20 reps;Supine Straight Leg Raises: AAROM;Both;20 reps;Supine    General Comments        Pertinent Vitals/Pain Pain Assessment: 0-10 Pain Score: 10-Worst pain ever Pain Location: B knees/L foot Pain Intervention(s): Monitored during session;Repositioned;RN gave pain meds during session  Home Living                      Prior Function            PT Goals (current goals can now be found in the care plan section) Progress towards PT  goals: Progressing toward goals    Frequency  7X/week    PT Plan Current plan remains appropriate    Co-evaluation             End of Session Equipment Utilized During Treatment: Gait belt Activity Tolerance: Patient tolerated treatment well;No increased pain (notes BLEs loosened up with exercises) Patient left: in chair;with call bell/phone within reach;with chair alarm set;with family/visitor present     Time: 1019-1100 PT Time Calculation (min) (ACUTE ONLY): 41 min  Charges:  $Gait Training: 8-22 mins $Therapeutic Exercise: 23-37 mins                    G Codes:      Charlaine Dalton 04/30/2015, 12:00 PM

## 2015-04-30 NOTE — Clinical Social Work Placement (Signed)
   CLINICAL SOCIAL WORK PLACEMENT  NOTE  Date:  04/30/2015  Patient Details  Name: Ernest Sims MRN: [id number] Date of Birth: [birth year]  Clinical Social Work is seeking post-discharge placement for this patient at the [facility] level of care (*CSW will initial, date and re-position this form in  chart as items are completed):  Yes   Patient/family provided with [facility]'s list of facilities offering this level of care within the geographic area requested by the patient (or if unable, by the patient's family).  Yes   Patient/family informed of their freedom to choose among providers that offer the needed level of care, that participate in Medicare, Medicaid or managed care program needed by the patient, have an available bed and are willing to accept the patient.  Yes   Patient/family informed of [facility]'s ownership interest in [facility] and [facility], as well as of the fact that they are under no obligation to receive care at these facilities.  PASRR submitted to EDS on 04/29/15     PASRR number received on 04/29/15     Existing PASRR number confirmed on       FL2 transmitted to all facilities in geographic area requested by pt/family on 04/29/15     FL2 transmitted to all facilities within larger geographic area on       Patient informed that his/her managed care company has contracts with or will negotiate with certain facilities, including the following:        Yes   Patient/family informed of bed offers received.  Patient chooses bed at [facility]     Physician recommends and patient chooses bed at  [facility])    Patient to be transferred to [facility] on 04/30/15.  Patient to be transferred to facility by [facility]     Patient family notified on 04/30/15 of transfer.  Name of family member notified:  Wife, Karmel     PHYSICIAN       Additional Comment:     _______________________________________________ Darden Dates, LCSW 04/30/2015, 12:10 PM

## 2015-04-30 NOTE — Clinical Social Work Note (Signed)
Pt is ready for discharge today to [facility]. Facility has received [place] from [place]. Pt is aware and agreeable to discharge plan. Facility is ready to admit pt as discharge information has been received. RN called report and EMS will provide transportation. CSW is signing off as no further needs identified.   Darden Dates, MSW, LCSW Clinical Social Worker  [phone]

## 2015-04-30 NOTE — Discharge Summary (Signed)
[facility] [place] NAME: Ernest Sims    MR#:  [id number]  DATE OF BIRTH:  1955/10/27  DATE OF ADMISSION:  04/26/2015 ADMITTING PHYSICIAN: Hillary Bow, MD  DATE OF DISCHARGE: 04/30/2015  PRIMARY CARE PHYSICIAN: SPARKS,JEFFREY D, MD   ADMISSION DIAGNOSIS:  Syncope [R55] Laceration [T14.8] CVA (cerebral infarction) [I63.9] Cerebral infarction due to unspecified mechanism [I63.9] Syncope, unspecified syncope type [R55]  DISCHARGE DIAGNOSIS:  Active Problems:   CVA (cerebral infarction)   SECONDARY DIAGNOSIS:   Past Medical History  Diagnosis Date  . Hypertension   . Migraines   . Anxiety   . Seasonal allergies   . COPD (chronic obstructive pulmonary disease) [facility])      ADMITTING HISTORY  Ernest Sims is a 60 y.o. male with a known history of hypertension, COPD, morbid obesity presents to the emergency room complaining of 3 days of falls and gait abnormalities. He initially went to urgent care due to having laceration of his left foot toes. He was referred to the emergency room here. Here patient's CT scan of the head showed left temporo-occipital subacute CVA. Patient noticed 3 days back that whenever he gets up and tries to walk he has falls. Has happened 4 times today. Not related to position. Initial concern was for syncope but patient remembers all these episodes and did not black out. He is having to use a cane for 2 days due to his gait abnormalities. No focal weakness or numbness. No dysphagia or change in vision. No history of CVA. Onset was greater than 3 hours.  HOSPITAL COURSE:   60 year old male with a known history of essential hypertension and morbid obesity who presented with a fall and gait abnormality.  # Gait abnormalities likely from arthirits MRI brain showed no CVA. Consulted neurology due to CT findings and Discussed with Dr. Doy Mince. Not CVA. No further w/u needed.  # Left knee pain: MRI  shows no quadricep tendon rupture however does show MCL sprain with synovitis. S/P steroid injection. Continue pain medications. SNF  # Essential hypertension: Continue propanolol and ARB.  # Morbid obesity:  # Hyperlipidemia: Continue Pravachol. LDL 73.  Stable for discharge to SNF  CONSULTS OBTAINED:  Treatment Team:  Hessie Knows, MD Catarina Hartshorn, MD Alexis Goodell, MD  DRUG ALLERGIES:   Allergies  Allergen Reactions  . Aspirin Nausea And Vomiting  . Etodolac Nausea And Vomiting  . Penicillins Other (See Comments)    Reaction:  Unknown     DISCHARGE MEDICATIONS:   Current Discharge Medication List    START taking these medications   Details  aspirin EC 81 MG EC tablet Take 1 tablet (81 mg total) by mouth daily.    clindamycin (CLEOCIN) 300 MG capsule Take 1 capsule (300 mg total) by mouth every 8 (eight) hours. Qty: 9 capsule, Refills: 0    docusate sodium (COLACE) 100 MG capsule Take 1 capsule (100 mg total) by mouth 2 (two) times daily. Qty: 10 capsule, Refills: 0      CONTINUE these medications which have CHANGED   Details  diazepam (VALIUM) 10 MG tablet Take 1 tablet (10 mg total) by mouth every 8 (eight) hours as needed for anxiety. Qty: 30 tablet, Refills: 0    oxyCODONE (OXY IR/ROXICODONE) 5 MG immediate release tablet Take 2 tablets (10 mg total) by mouth every 4 (four) hours as needed for moderate pain or severe pain. Qty: 30 tablet, Refills: 0    zolpidem (AMBIEN) 10  MG tablet Take 1 tablet (10 mg total) by mouth at bedtime as needed for sleep. Qty: 10 tablet, Refills: 0      CONTINUE these medications which have NOT CHANGED   Details  allopurinol (ZYLOPRIM) 300 MG tablet Take 300 mg by mouth as needed.     butalbital-acetaminophen-caffeine (FIORICET, ESGIC) 50-325-40 MG per tablet Take 1 tablet by mouth every 4 (four) hours as needed for headache.    Fluticasone-Salmeterol (ADVAIR) 250-50 MCG/DOSE AEPB Inhale 1 puff into the lungs 2  (two) times daily.    latanoprost (XALATAN) 0.005 % ophthalmic solution Place 1 drop into both eyes at bedtime.    olmesartan (BENICAR) 20 MG tablet Take 20 mg by mouth daily.    omeprazole (PRILOSEC) 40 MG capsule Take 40 mg by mouth daily.    pravastatin (PRAVACHOL) 40 MG tablet Take 40 mg by mouth daily.    propranolol (INDERAL) 40 MG tablet Take 40 mg by mouth 2 (two) times daily.    timolol (TIMOPTIC) 0.5 % ophthalmic solution Place 1 drop into both eyes 2 (two) times daily.    triamcinolone (NASACORT) 55 MCG/ACT AERO nasal inhaler Place 2 sprays into the nose 2 (two) times daily as needed.       STOP taking these medications     pseudoephedrine (SUDAFED) 60 MG tablet         Today   VITAL SIGNS:  Blood pressure 157/60, pulse 80, temperature 97.9 F (36.6 C), temperature source Oral, resp. rate 20, height 5' 10"  (1.778 m), weight 136.805 kg (301 lb 9.6 oz), SpO2 100 %.  I/O:   Intake/Output Summary (Last 24 hours) at 04/30/15 1113 Last data filed at 04/30/15 0900  Gross per 24 hour  Intake    600 ml  Output   1000 ml  Net   -400 ml    PHYSICAL EXAMINATION:  Physical Exam  GENERAL:  60 y.o.-year-old patient lying in the bed with no acute distress. Obese LUNGS: Normal breath sounds bilaterally, no wheezing, rales,rhonchi or crepitation. No use of accessory muscles of respiration.  CARDIOVASCULAR: S1, S2 normal. No murmurs, rubs, or gallops.  ABDOMEN: Soft, non-tender, non-distended. Bowel sounds present. No organomegaly or mass.  NEUROLOGIC: Moves all 4 extremities. PSYCHIATRIC: The patient is alert and oriented x 3.  SKIN: No obvious rash, lesion, or ulcer.  Left knee tender  DATA REVIEW:   CBC  Recent Labs Lab 04/29/15 0535  WBC 9.6  HGB 13.2  HCT 38.9*  PLT 159    Chemistries   Recent Labs Lab 04/29/15 0535  NA 137  K 4.6  CL 105  CO2 24  GLUCOSE 131*  BUN 23*  CREATININE 1.14  CALCIUM 9.1    Cardiac Enzymes  Recent Labs Lab  04/27/15 [place] <0.03    Microbiology Results  No results found for this or any previous visit.  RADIOLOGY:  No results found.  Follow up with PCP in 1 week.  Management plans discussed with the patient, family and they are in agreement.  CODE STATUS:     Code Status Orders        Start     Ordered   04/26/15 2244  Full code   Continuous     04/26/15 2244    Code Status History    Date Active Date Inactive Code Status Order ID Comments User Context   This patient has a current code status but no historical code status.      TOTAL TIME  TAKING CARE OF THIS PATIENT ON DAY OF DISCHARGE: more than 30 minutes.   Hillary Bow R M.D on 04/30/2015 at 11:13 AM  Between 7am to 6pm - Pager - [phone]  After 6pm go to [email or web address] - password [email or web address] [facility]  Office  [phone]  CC: Primary care physician; Idelle Crouch, MD  Note: This dictation was prepared with Dragon dictation along with smaller phrase technology. Any transcriptional errors that result from this process are unintentional.

## 2015-04-30 NOTE — Progress Notes (Signed)
[facility]. No resp distress. Vss.   Report called to  [place] [facility]  Sl d/cd. Pt ready for discharge.  Ems  Called to transport pt.

## 2015-07-24 ENCOUNTER — Inpatient Hospital Stay: Admission: RE | Admit: 2015-07-24 | Payer: BLUE CROSS/BLUE SHIELD | Source: Ambulatory Visit

## 2015-07-24 ENCOUNTER — Telehealth: Payer: Self-pay | Admitting: Pulmonary Disease

## 2015-07-24 NOTE — Telephone Encounter (Signed)
Attempted to call Marzetta Board but she was out of the office.  LVM for pt to return call.

## 2015-07-25 NOTE — Telephone Encounter (Signed)
Noted  

## 2016-05-07 ENCOUNTER — Other Ambulatory Visit: Payer: Self-pay | Admitting: Internal Medicine

## 2016-05-07 DIAGNOSIS — N183 Chronic kidney disease, stage 3 unspecified: Secondary | ICD-10-CM

## 2016-05-13 ENCOUNTER — Ambulatory Visit
Admission: RE | Admit: 2016-05-13 | Discharge: 2016-05-13 | Disposition: A | Discharge status: Home / Self Care | Payer: BLUE CROSS/BLUE SHIELD | Source: Ambulatory Visit | Attending: Internal Medicine | Admitting: Internal Medicine

## 2016-05-13 DIAGNOSIS — N183 Chronic kidney disease, stage 3 unspecified: Secondary | ICD-10-CM

## 2016-05-20 ENCOUNTER — Emergency Department: Payer: BLUE CROSS/BLUE SHIELD

## 2016-05-20 ENCOUNTER — Emergency Department
Admission: EM | Admit: 2016-05-20 | Discharge: 2016-05-20 | Disposition: A | Discharge status: Home / Self Care | Payer: BLUE CROSS/BLUE SHIELD | Attending: Emergency Medicine | Admitting: Emergency Medicine

## 2016-05-20 DIAGNOSIS — R21 Rash and other nonspecific skin eruption: Secondary | ICD-10-CM | POA: Insufficient documentation

## 2016-05-20 DIAGNOSIS — Z79899 Other long term (current) drug therapy: Secondary | ICD-10-CM | POA: Diagnosis not present

## 2016-05-20 DIAGNOSIS — J449 Chronic obstructive pulmonary disease, unspecified: Secondary | ICD-10-CM | POA: Insufficient documentation

## 2016-05-20 DIAGNOSIS — R05 Cough: Secondary | ICD-10-CM | POA: Diagnosis present

## 2016-05-20 DIAGNOSIS — I10 Essential (primary) hypertension: Secondary | ICD-10-CM | POA: Diagnosis not present

## 2016-05-20 DIAGNOSIS — Z87891 Personal history of nicotine dependence: Secondary | ICD-10-CM | POA: Diagnosis not present

## 2016-05-20 DIAGNOSIS — Z7982 Long term (current) use of aspirin: Secondary | ICD-10-CM | POA: Diagnosis not present

## 2016-05-20 DIAGNOSIS — J209 Acute bronchitis, unspecified: Secondary | ICD-10-CM | POA: Insufficient documentation

## 2016-05-20 MED ORDER — METHYLPREDNISOLONE SODIUM SUCC 125 MG IJ SOLR
125.0000 mg | Freq: Once | INTRAMUSCULAR | Status: AC
  Administered 2016-05-20: 125 mg via INTRAMUSCULAR
  Filled 2016-05-20: qty 2

## 2016-05-20 MED ORDER — METHYLPREDNISOLONE 4 MG PO TBPK: ORAL_TABLET | ORAL | 0 refills | Status: DC

## 2016-05-20 MED ORDER — IPRATROPIUM-ALBUTEROL 0.5-2.5 (3) MG/3ML IN SOLN
3.0000 mL | Freq: Once | RESPIRATORY_TRACT | Status: AC
  Administered 2016-05-20: 3 mL via RESPIRATORY_TRACT
  Filled 2016-05-20: qty 3

## 2016-05-20 MED ORDER — PSEUDOEPH-BROMPHEN-DM 30-2-10 MG/5ML PO SYRP: 5.0000 mL | ORAL_SOLUTION | Freq: Four times a day (QID) | ORAL | 0 refills | Status: DC | PRN

## 2016-05-20 MED ORDER — HYDROCORTISONE VALERATE 0.2 % EX OINT: TOPICAL_OINTMENT | CUTANEOUS | 1 refills | Status: AC

## 2016-05-20 NOTE — ED Triage Notes (Signed)
Pt c/o cough with congestion that started yesterday and today woke with a fever 104, states he took Advil and ASA around 5am this morning.

## 2016-05-20 NOTE — ED Notes (Signed)
See triage note  States he developed some SOB with wheezing. Sx's started several days ago  Fever at home this am   But afebrile on arrival

## 2016-05-20 NOTE — ED Provider Notes (Signed)
[facility] Emergency Department Provider Note  ____________________________________________   None    (approximate)  I have reviewed the triage vital signs and the nursing notes.   HISTORY  Chief Complaint Fever and Cough    HPI Ernest Sims is a 61 y.o. male who presents to the ED with complaints of cough, SOB, and fever. He notes 4 days of cough and SOB which has worsened since the onset. He noted a fever as high as 104 which he has taken Aleve for with improvement. He has a history of COPD and is currently on Advair, but this has not improved his symptoms. He denied any sore throat, abdominal pain, or N/V/D.    Past Medical History:  Diagnosis Date  . Anxiety   . COPD (chronic obstructive pulmonary disease) ([place])   . Hypertension   . Migraines   . Seasonal allergies     Patient Active Problem List   Diagnosis Date Noted  . CVA (cerebral infarction) 04/26/2015  . Acid reflux 07/12/2014  . Hoarseness 07/12/2014  . Shortness of breath 05/22/2014  . Morbid obesity ([place]) 05/22/2014  . Physical deconditioning 05/22/2014    Past Surgical History:  Procedure Laterality Date  . COLONOSCOPY WITH PROPOFOL N/A 02/28/2015   Procedure: COLONOSCOPY WITH PROPOFOL;  Surgeon: Manya Silvas, MD;  Location[facility] ENDOSCOPY;  Service: Endoscopy;  Laterality: N/A;  . ESOPHAGOGASTRODUODENOSCOPY (EGD) WITH PROPOFOL N/A 02/28/2015   Procedure: ESOPHAGOGASTRODUODENOSCOPY (EGD) WITH PROPOFOL;  Surgeon: Manya Silvas, MD;  Location: [facility] ENDOSCOPY;  Service: Endoscopy;  Laterality: N/A;  . REVISION TOTAL HIP ARTHROPLASTY  2003  . ruptured disc  1984    Prior to Admission medications   Medication Sig Start Date End Date Taking? Authorizing Provider  amLODipine-olmesartan (AZOR) 5-40 MG tablet Take 1 tablet by mouth daily.   Yes Historical Provider, MD  allopurinol (ZYLOPRIM) 300 MG tablet Take 300 mg by mouth as needed.     Historical Provider, MD    aspirin EC 81 MG EC tablet Take 1 tablet (81 mg total) by mouth daily. 04/29/15   Hillary Bow, MD  brompheniramine-pseudoephedrine-DM 30-2-10 MG/5ML syrup Take 5 mLs by mouth 4 (four) times daily as needed. 05/20/16   Sable Feil, PA-C  butalbital-acetaminophen-caffeine (FIORICET, ESGIC) [phone] MG per tablet Take 1 tablet by mouth every 4 (four) hours as needed for headache.    Historical Provider, MD  clindamycin (CLEOCIN) 300 MG capsule Take 1 capsule (300 mg total) by mouth every 8 (eight) hours. 04/29/15   Hillary Bow, MD  diazepam (VALIUM) 10 MG tablet Take 1 tablet (10 mg total) by mouth every 8 (eight) hours as needed for anxiety. 04/29/15   Hillary Bow, MD  docusate sodium (COLACE) 100 MG capsule Take 1 capsule (100 mg total) by mouth 2 (two) times daily. 04/29/15   Srikar Sudini, MD  Fluticasone-Salmeterol (ADVAIR) 250-50 MCG/DOSE AEPB Inhale 1 puff into the lungs 2 (two) times daily.    Historical Provider, MD  hydrocortisone valerate ointment (WESTCORT) 0.2 % Apply to affected area daily 05/20/16 05/20/17  Sable Feil, PA-C  latanoprost (XALATAN) 0.005 % ophthalmic solution Place 1 drop into both eyes at bedtime.    Historical Provider, MD  methylPREDNISolone (MEDROL DOSEPAK) 4 MG TBPK tablet Take Tapered dose as directed 05/20/16   Sable Feil, PA-C  olmesartan (BENICAR) 20 MG tablet Take 20 mg by mouth daily.    Historical Provider, MD  omeprazole (PRILOSEC) 40 MG capsule Take 40 mg by  mouth daily.    Historical Provider, MD  oxyCODONE (OXY IR/ROXICODONE) 5 MG immediate release tablet Take 2 tablets (10 mg total) by mouth every 4 (four) hours as needed for moderate pain or severe pain. 04/29/15   Srikar Sudini, MD  pravastatin (PRAVACHOL) 40 MG tablet Take 40 mg by mouth daily.    Historical Provider, MD  propranolol (INDERAL) 40 MG tablet Take 40 mg by mouth 2 (two) times daily. 11/20/13   Historical Provider, MD  timolol (TIMOPTIC) 0.5 % ophthalmic solution Place 1 drop into  both eyes 2 (two) times daily.    Historical Provider, MD  triamcinolone (NASACORT) 55 MCG/ACT AERO nasal inhaler Place 2 sprays into the nose 2 (two) times daily as needed.     Historical Provider, MD  zolpidem (AMBIEN) 10 MG tablet Take 1 tablet (10 mg total) by mouth at bedtime as needed for sleep. 04/29/15   Hillary Bow, MD    Allergies Etodolac and Penicillins  Family History  Problem Relation Age of Onset  . Lung disease Father   . Heart disease Father   . Lung disease Mother   . Cancer Brother     liver  . Cancer Maternal Aunt     Social History Social History  Substance Use Topics  . Smoking status: Former Smoker    Packs/day: 3.00    Years: 24.00    Types: Cigarettes    Quit date: 03/08/1988  . Smokeless tobacco: Never Used  . Alcohol use 0.0 oz/week     Comment: occasional    Review of Systems Constitutional: Positive for fever/chills Eyes: No visual changes. ENT: No sore throat. Cardiovascular: Denies chest pain. Respiratory: Positive for shortness of breath, wheezing Gastrointestinal: No abdominal pain.  No nausea, no vomiting.  No diarrhea.  No constipation. Genitourinary: Negative for dysuria. Musculoskeletal: Negative for back pain. Skin: Positive for rash. Neurological: Negative for headaches, focal weakness or numbness. 10-point ROS otherwise negative.  ____________________________________________   PHYSICAL EXAM:  VITAL SIGNS: ED Triage Vitals  Enc Vitals Group     BP 05/20/16 1023 135/70     Pulse Rate 05/20/16 1023 82     Resp 05/20/16 1023 18     Temp 05/20/16 1023 98 F (36.7 C)     Temp Source 05/20/16 1023 Oral     SpO2 05/20/16 1023 97 %     Weight 05/20/16 1023 (!) 325 lb (147.4 kg)     Height 05/20/16 1023 5' 9"  (1.753 m)     Head Circumference --      [facility] --      Pain Score 05/20/16 1024 5     Pain Loc --      Pain Edu? --      Excl. in [place]? --     Constitutional: Alert and oriented. Well appearing and in no acute  distress. Morbid obesity Eyes: Conjunctivae are normal. PERRL. EOMI. Head: Atraumatic. Nose: No congestion/rhinnorhea. Mouth/Throat: Mucous membranes are moist.  Oropharynx non-erythematous. Neck: No stridor.  Cardiovascular: Normal rate, regular rhythm. Grossly normal heart sounds.  Good peripheral circulation. Respiratory: Diffuse expiratory wheezing throughout all fields, conversational dyspnea Gastrointestinal: Obese, Soft and nontender. No distention. No abdominal bruits. No CVA tenderness. Musculoskeletal: No lower extremity tenderness nor edema.  No joint effusions. Neurologic:  Normal speech and language. No gross focal neurologic deficits are appreciated. No gait instability. Skin:  Skin is warm, dry and intact. Venous stasis changes bilateral lower extremities  Psychiatric: Mood and affect are normal. Speech  and behavior are normal.  ____________________________________________   LABS (all labs ordered are listed, but only abnormal results are displayed)  Labs Reviewed - No data to display ____________________________________________  EKG   ____________________________________________  RADIOLOGY  No acute findings except peribronchial thickening. ____________________________________________   PROCEDURES  Procedure(s) performed: None  Procedures  Critical Care performed: No  ____________________________________________   INITIAL IMPRESSION / ASSESSMENT AND PLAN / ED COURSE  Pertinent labs & imaging results that were available during my care of the patient were reviewed by me and considered in my medical decision making (see chart for details).  Bronchitis and stasis dermatitis right lower leg. Discussed x-ray finding with patient. Patient given DuoNeb and Solu-Medrol. Patient given discharge care instructions. Patient given prescription for Medrol Dosepak, from felt DM, and Westcort. Patient advised continued use Advil as directed. Use pro-air as a rescue  inhaler. Follow-up with treating doctor as needed.    ____________________________________________   FINAL CLINICAL IMPRESSION(S) / ED DIAGNOSES  Final diagnoses:  Acute bronchitis, unspecified organism  Rash      NEW MEDICATIONS STARTED DURING THIS VISIT:  New Prescriptions   BROMPHENIRAMINE-PSEUDOEPHEDRINE-DM 30-2-10 MG/5ML SYRUP    Take 5 mLs by mouth 4 (four) times daily as needed.   HYDROCORTISONE VALERATE OINTMENT (WESTCORT) 0.2 %    Apply to affected area daily   METHYLPREDNISOLONE (MEDROL DOSEPAK) 4 MG TBPK TABLET    Take Tapered dose as directed     Note:  This document was prepared using Dragon voice recognition software and may include unintentional dictation errors.    Sable Feil, PA-C 05/20/16 1128    Earleen Newport, MD 05/20/16 [id number]

## 2016-05-21 NOTE — Discharge Instructions (Signed)
Cataract Surgery, Care After Refer to this sheet in the next few weeks. These instructions provide you with information about caring for yourself after your procedure. Your health care provider may also give you more specific instructions. Your treatment has been planned according to current medical practices, but problems sometimes occur. Call your health care provider if you have any problems or questions after your procedure. What can I expect after the procedure? After the procedure, it is common to have:  Itching.  Discomfort.  Fluid discharge.  Sensitivity to light and to touch.  Bruising. Follow these instructions at home: [place] your eye every day for signs of infection. Watch for:  Redness, swelling, or pain.  Fluid, blood, or pus.  Warmth.  Bad smell. Activity   Avoid strenuous activities, such as playing contact sports, for as long as told by your health care provider.  Do not drive or operate heavy machinery until your health care provider approves.  Do not bend or lift heavy objects. Bending increases pressure in the eye. You can walk, climb stairs, and do light household chores.  Ask your health care provider when you can return to work. If you work in a dusty environment, you may be advised to wear protective eyewear for a period of time. General instructions   Take or apply over-the-counter and prescription medicines only as told by your health care provider. This includes eye drops.  Do not touch or rub your eyes.  If you were given a protective shield, wear it as told by your health care provider. If you were not given a protective shield, wear sunglasses as told by your health care provider to protect your eyes.  Keep the area around your eye clean and dry. Avoid swimming or allowing water to hit you directly in the face while showering until told by your health care provider. Keep soap and shampoo out of your eyes.  Do not put a contact lens  into the affected eye or eyes until your health care provider approves.  Keep all follow-up visits as told by your health care provider. This is important. Contact a health care provider if:   You have increased bruising around your eye.  You have pain that is not helped with medicine.  You have a fever.  You have redness, swelling, or pain in your eye.  You have fluid, blood, or pus coming from your incision.  Your vision gets worse. Get help right away if:  You have sudden vision loss. This information is not intended to replace advice given to you by your health care provider. Make sure you discuss any questions you have with your health care provider. Document Released: 09/05/2004 Document Revised: 06/27/2015 Document Reviewed: 12/27/2014 Elsevier Interactive Patient Education  2017 [employer] Anesthesia, Adult, Care After These instructions provide you with information about caring for yourself after your procedure. Your health care provider may also give you more specific instructions. Your treatment has been planned according to current medical practices, but problems sometimes occur. Call your health care provider if you have any problems or questions after your procedure. What can I expect after the procedure? After the procedure, it is common to have:  Vomiting.  A sore throat.  Mental slowness. It is common to feel:  Nauseous.  Cold or shivery.  Sleepy.  Tired.  Sore or achy, even in parts of your body where you did not have surgery. Follow these instructions at home: For  at least 24 hours after the procedure:   Do not:  Participate in activities where you could fall or become injured.  Drive.  Use heavy machinery.  Drink alcohol.  Take sleeping pills or medicines that cause drowsiness.  Make important decisions or sign legal documents.  Take care of children on your own.  Rest. Eating and drinking   If you vomit, drink  water, juice, or soup when you can drink without vomiting.  Drink enough fluid to keep your urine clear or pale yellow.  Make sure you have little or no nausea before eating solid foods.  Follow the diet recommended by your health care provider. General instructions   Have a responsible adult stay with you until you are awake and alert.  Return to your normal activities as told by your health care provider. Ask your health care provider what activities are safe for you.  Take over-the-counter and prescription medicines only as told by your health care provider.  If you smoke, do not smoke without supervision.  Keep all follow-up visits as told by your health care provider. This is important. Contact a health care provider if:  You continue to have nausea or vomiting at home, and medicines are not helpful.  You cannot drink fluids or start eating again.  You cannot urinate after 8-12 hours.  You develop a skin rash.  You have fever.  You have increasing redness at the site of your procedure. Get help right away if:  You have difficulty breathing.  You have chest pain.  You have unexpected bleeding.  You feel that you are having a life-threatening or urgent problem. This information is not intended to replace advice given to you by your health care provider. Make sure you discuss any questions you have with your health care provider. Document Released: 05/25/2000 Document Revised: 07/22/2015 Document Reviewed: 01/31/2015 Elsevier Interactive Patient Education  2017 [employer].

## 2016-05-25 ENCOUNTER — Encounter: Payer: Self-pay | Admitting: *Deleted

## 2016-06-30 ENCOUNTER — Other Ambulatory Visit: Payer: Self-pay | Admitting: Internal Medicine

## 2016-06-30 DIAGNOSIS — R05 Cough: Secondary | ICD-10-CM

## 2016-06-30 DIAGNOSIS — R053 Chronic cough: Secondary | ICD-10-CM

## 2016-07-14 ENCOUNTER — Encounter: Payer: Self-pay | Admitting: *Deleted

## 2016-07-22 ENCOUNTER — Encounter
Admission: RE | Disposition: A | Discharge status: Home / Self Care | Payer: Self-pay | Source: Ambulatory Visit | Attending: Ophthalmology

## 2016-07-22 ENCOUNTER — Ambulatory Visit: Payer: BLUE CROSS/BLUE SHIELD | Admitting: Anesthesiology

## 2016-07-22 ENCOUNTER — Encounter: Payer: Self-pay | Admitting: *Deleted

## 2016-07-22 ENCOUNTER — Ambulatory Visit
Admission: RE | Admit: 2016-07-22 | Discharge: 2016-07-22 | Disposition: A | Discharge status: Home / Self Care | Payer: BLUE CROSS/BLUE SHIELD | Source: Ambulatory Visit | Attending: Ophthalmology | Admitting: Ophthalmology

## 2016-07-22 DIAGNOSIS — I10 Essential (primary) hypertension: Secondary | ICD-10-CM | POA: Insufficient documentation

## 2016-07-22 DIAGNOSIS — Z7982 Long term (current) use of aspirin: Secondary | ICD-10-CM | POA: Diagnosis not present

## 2016-07-22 DIAGNOSIS — Z880 Allergy status to penicillin: Secondary | ICD-10-CM | POA: Diagnosis not present

## 2016-07-22 DIAGNOSIS — R0601 Orthopnea: Secondary | ICD-10-CM | POA: Diagnosis not present

## 2016-07-22 DIAGNOSIS — K5090 Crohn's disease, unspecified, without complications: Secondary | ICD-10-CM | POA: Diagnosis not present

## 2016-07-22 DIAGNOSIS — F419 Anxiety disorder, unspecified: Secondary | ICD-10-CM | POA: Diagnosis not present

## 2016-07-22 DIAGNOSIS — Z87891 Personal history of nicotine dependence: Secondary | ICD-10-CM | POA: Diagnosis not present

## 2016-07-22 DIAGNOSIS — H2511 Age-related nuclear cataract, right eye: Secondary | ICD-10-CM | POA: Insufficient documentation

## 2016-07-22 DIAGNOSIS — Z79899 Other long term (current) drug therapy: Secondary | ICD-10-CM | POA: Insufficient documentation

## 2016-07-22 DIAGNOSIS — E7800 Pure hypercholesterolemia, unspecified: Secondary | ICD-10-CM | POA: Diagnosis not present

## 2016-07-22 DIAGNOSIS — G2581 Restless legs syndrome: Secondary | ICD-10-CM | POA: Insufficient documentation

## 2016-07-22 HISTORY — DX: Restless legs syndrome: G25.81

## 2016-07-22 HISTORY — DX: Crohn's disease, unspecified, without complications: K50.90

## 2016-07-22 HISTORY — DX: Noninfective gastroenteritis and colitis, unspecified: K52.9

## 2016-07-22 HISTORY — DX: Orthopnea: R06.01

## 2016-07-22 HISTORY — DX: Motion sickness, initial encounter: T75.3XXA

## 2016-07-22 HISTORY — DX: Diverticulosis of intestine, part unspecified, without perforation or abscess without bleeding: K57.90

## 2016-07-22 HISTORY — DX: Pure hypercholesterolemia, unspecified: E78.00

## 2016-07-22 HISTORY — DX: Dyspnea, unspecified: R06.00

## 2016-07-22 HISTORY — DX: Unspecified osteoarthritis, unspecified site: M19.90

## 2016-07-22 HISTORY — PX: CATARACT EXTRACTION W/PHACO: SHX586

## 2016-07-22 HISTORY — DX: Gastro-esophageal reflux disease without esophagitis: K21.9

## 2016-07-22 SURGERY — PHACOEMULSIFICATION, CATARACT, WITH IOL INSERTION
Anesthesia: Monitor Anesthesia Care | Site: Eye | Laterality: Right | Wound class: Clean

## 2016-07-22 MED ORDER — EPINEPHRINE PF 1 MG/ML IJ SOLN
INTRAOCULAR | Status: DC | PRN
  Administered 2016-07-22: 82 mL via OPHTHALMIC

## 2016-07-22 MED ORDER — MIDAZOLAM HCL 2 MG/2ML IJ SOLN
INTRAMUSCULAR | Status: DC | PRN
  Administered 2016-07-22: 2 mg via INTRAVENOUS

## 2016-07-22 MED ORDER — MOXIFLOXACIN HCL 0.5 % OP SOLN
1.0000 [drp] | OPHTHALMIC | Status: DC | PRN
  Administered 2016-07-22 (×3): 1 [drp] via OPHTHALMIC

## 2016-07-22 MED ORDER — MOXIFLOXACIN HCL 0.5 % OP SOLN: OPHTHALMIC | Status: DC | PRN

## 2016-07-22 MED ORDER — NA HYALUR & NA CHOND-NA HYALUR 0.4-0.35 ML IO KIT
PACK | INTRAOCULAR | Status: DC | PRN
  Administered 2016-07-22: 1 mL via INTRAOCULAR

## 2016-07-22 MED ORDER — BRIMONIDINE TARTRATE-TIMOLOL 0.2-0.5 % OP SOLN
OPHTHALMIC | Status: DC | PRN
  Administered 2016-07-22: 1 [drp] via OPHTHALMIC

## 2016-07-22 MED ORDER — ARMC OPHTHALMIC DILATING DROPS
1.0000 "application " | OPHTHALMIC | Status: DC | PRN
  Administered 2016-07-22 (×3): 1 via OPHTHALMIC

## 2016-07-22 MED ORDER — LIDOCAINE HCL (PF) 2 % IJ SOLN
INTRAOCULAR | Status: DC | PRN
  Administered 2016-07-22: 1 mL via INTRAOCULAR

## 2016-07-22 MED ORDER — MOXIFLOXACIN HCL 0.5 % OP SOLN
OPHTHALMIC | Status: DC | PRN
  Administered 2016-07-22: .2 mL via OPHTHALMIC

## 2016-07-22 MED ORDER — FENTANYL CITRATE (PF) 100 MCG/2ML IJ SOLN
INTRAMUSCULAR | Status: DC | PRN
  Administered 2016-07-22 (×2): 50 ug via INTRAVENOUS

## 2016-07-22 SURGICAL SUPPLY — 25 items
ACRYSOF IQ TORIC LENS (Intraocular Lens) ×3 IMPLANT
CANNULA ANT/CHMB 27GA (MISCELLANEOUS) ×3 IMPLANT
CARTRIDGE ABBOTT (MISCELLANEOUS) IMPLANT
GLOVE SURG LX 7.5 STRW (GLOVE) ×2
GLOVE SURG LX STRL 7.5 STRW (GLOVE) ×1 IMPLANT
GLOVE SURG TRIUMPH 8.0 PF LTX (GLOVE) ×6 IMPLANT
GOWN STRL REUS W/ TWL LRG LVL3 (GOWN DISPOSABLE) ×2 IMPLANT
GOWN STRL REUS W/TWL LRG LVL3 (GOWN DISPOSABLE) ×4
MARKER SKIN DUAL TIP RULER LAB (MISCELLANEOUS) ×3 IMPLANT
NDL RETROBULBAR .5 NSTRL (NEEDLE) IMPLANT
NEEDLE FILTER BLUNT 18X 1/2SAF (NEEDLE) ×2
NEEDLE FILTER BLUNT 18X1 1/2 (NEEDLE) ×1 IMPLANT
PACK CATARACT BRASINGTON (MISCELLANEOUS) ×3 IMPLANT
PACK EYE AFTER SURG (MISCELLANEOUS) ×3 IMPLANT
PACK OPTHALMIC (MISCELLANEOUS) ×3 IMPLANT
RING MALYGIN 7.0 (MISCELLANEOUS) IMPLANT
SUT ETHILON 10-0 CS-B-6CS-B-6 (SUTURE)
SUT VICRYL  9 0 (SUTURE)
SUT VICRYL 9 0 (SUTURE) IMPLANT
SUTURE EHLN 10-0 CS-B-6CS-B-6 (SUTURE) IMPLANT
SYR 3ML LL SCALE MARK (SYRINGE) ×3 IMPLANT
SYR 5ML LL (SYRINGE) ×3 IMPLANT
SYR TB 1ML LUER SLIP (SYRINGE) ×3 IMPLANT
WATER STERILE IRR 250ML POUR (IV SOLUTION) ×3 IMPLANT
WIPE NON LINTING 3.25X3.25 (MISCELLANEOUS) ×3 IMPLANT

## 2016-07-22 NOTE — Anesthesia Postprocedure Evaluation (Signed)
Anesthesia Post Note  Patient: Ernest Sims  Procedure(s) Performed: Procedure(s) (LRB): CATARACT EXTRACTION PHACO AND INTRAOCULAR LENS PLACEMENT (IOC) RIGHT (Right)  Patient location during evaluation: PACU Anesthesia Type: MAC Level of consciousness: awake and alert and oriented Pain management: pain level controlled Vital Signs Assessment: post-procedure vital signs reviewed and stable Respiratory status: spontaneous breathing and nonlabored ventilation Cardiovascular status: stable Postop Assessment: no signs of nausea or vomiting and adequate PO intake Anesthetic complications: no    Estill Batten

## 2016-07-22 NOTE — Anesthesia Preprocedure Evaluation (Signed)
Anesthesia Evaluation  Patient identified by MRN, date of birth, ID band Patient awake    Reviewed: Allergy & Precautions, NPO status , Patient's Chart, lab work & pertinent test results  Airway Mallampati: II  TM Distance: >3 FB Neck ROM: Full    Dental no notable dental hx.    Pulmonary shortness of breath, with exertion and lying, COPD, former smoker,           Cardiovascular hypertension, Pt. on medications and Pt. on home beta blockers + Orthopnea  Normal cardiovascular exam     Neuro/Psych  Headaches, PSYCHIATRIC DISORDERS Anxiety    GI/Hepatic Neg liver ROS, GERD  Medicated and Controlled,  Endo/Other  negative endocrine ROS  Renal/GU negative Renal ROS     Musculoskeletal  (+) Arthritis ,   Abdominal (+) + obese,   Peds  Hematology negative hematology ROS (+)   Anesthesia Other Findings   Reproductive/Obstetrics                             Anesthesia Physical Anesthesia Plan  ASA: III  Anesthesia Plan: MAC   Post-op Pain Management:    Induction: Intravenous  Airway Management Planned:   Additional Equipment:   Intra-op Plan:   Post-operative Plan:   Informed Consent: I have reviewed the patients History and Physical, chart, labs and discussed the procedure including the risks, benefits and alternatives for the proposed anesthesia with the patient or authorized representative who has indicated his/her understanding and acceptance.     Plan Discussed with: CRNA  Anesthesia Plan Comments:         Anesthesia Quick Evaluation

## 2016-07-22 NOTE — H&P (Signed)
The History and Physical notes are on paper, have been signed, and are to be scanned. The patient remains stable and unchanged from the H&P.   Previous H&P reviewed, patient examined, and there are no changes.  Ernest Sims 07/22/2016 10:23 AM

## 2016-07-22 NOTE — Transfer of Care (Signed)
Immediate Anesthesia Transfer of Care Note  Patient: Ernest Sims  Procedure(s) Performed: Procedure(s) with comments: CATARACT EXTRACTION PHACO AND INTRAOCULAR LENS PLACEMENT ([place]) RIGHT (Right) - IVA TOPICAL RIGHT TORIC  Patient Location: PACU  Anesthesia Type: MAC  Level of Consciousness: awake, alert  and patient cooperative  Airway and Oxygen Therapy: Patient Spontanous Breathing and Patient connected to supplemental oxygen  Post-op Assessment: Post-op Vital signs reviewed, Patient's Cardiovascular Status Stable, Respiratory Function Stable, Patent Airway and No signs of Nausea or vomiting  Post-op Vital Signs: Reviewed and stable  Complications: No apparent anesthesia complications

## 2016-07-22 NOTE — Op Note (Signed)
LOCATION:  [place]   PREOPERATIVE DIAGNOSIS:  Nuclear sclerotic cataract of the right eye.  H25.11   POSTOPERATIVE DIAGNOSIS:  Nuclear sclerotic cataract of the right eye.   PROCEDURE:  Phacoemulsification with Toric posterior chamber intraocular lens placement of the right eye.   LENS:   Implant Name Type Inv. Item Serial No. Manufacturer Lot No. LRB No. Used  ACRYSOF IQ TORIC LENS Intraocular Lens   [id number] ALCON   Right 1     [id number] 18.0 D  Toric intraocular lens with 2.25 diopters of cylindrical power with axis orientation at 179 degrees.   ULTRASOUND TIME: 15 % of 1 minutes, 31 seconds.  CDE 13.5   SURGEON:  Wyonia Hough, MD   ANESTHESIA: Topical with tetracaine drops and 2% Xylocaine jelly, augmented with 1% preservative-free intracameral lidocaine. .   COMPLICATIONS:  None.   DESCRIPTION OF PROCEDURE:  The patient was identified in the holding room and transported to the operating suite and placed in the supine position under the operating microscope.  The right eye was identified as the operative eye, and it was prepped and draped in the usual sterile ophthalmic fashion.    A clear-corneal paracentesis incision was made at the 12:00 position.  0.5 ml of preservative-free 1% lidocaine was injected into the anterior chamber. The anterior chamber was filled with Viscoat.  A 2.4 millimeter near clear corneal incision was then made at the 9:00 position.  A cystotome and capsulorrhexis forceps were then used to make a curvilinear capsulorrhexis.  Hydrodissection and hydrodelineation were then performed using balanced salt solution.   Phacoemulsification was then used in stop and chop fashion to remove the lens, nucleus and epinucleus.  The remaining cortex was aspirated using the irrigation and aspiration handpiece.  Provisc viscoelastic was then placed into the capsular bag to distend it for lens placement.  The Verion digital marker was used to align the  implant at the intended axis.   A Toric lens was then injected into the capsular bag.  It was rotated clockwise until the axis marks on the lens were approximately 15 degrees in the counterclockwise direction to the intended alignment.  The viscoelastic was aspirated from the eye using the irrigation aspiration handpiece.  Then, a Koch spatula through the sideport incision was used to rotate the lens in a clockwise direction until the axis markings of the intraocular lens were lined up with the Verion alignment.  Balanced salt solution was then used to hydrate the wounds. Vigamox 0.2 ml of a 32m per ml solution was injected into the anterior chamber for a dose of 0.2 mg of intracameral antibiotic at the completion of the case.    The eye was noted to have a physiologic pressure and there was no wound leak noted.   Timolol and Brimonidine drops were applied to the eye.  The patient was taken to the recovery room in stable condition having had no complications of anesthesia or surgery.  Sya Nestler 07/22/2016, 11:28 AM

## 2016-07-22 NOTE — Anesthesia Procedure Notes (Signed)
Procedure Name: MAC Performed by: Mayme Genta Pre-anesthesia Checklist: Patient identified, Emergency Drugs available, Suction available, Timeout performed and Patient being monitored Patient Re-evaluated:Patient Re-evaluated prior to inductionOxygen Delivery Method: Nasal cannula Placement Confirmation: positive ETCO2

## 2016-07-30 ENCOUNTER — Ambulatory Visit
Admission: RE | Admit: 2016-07-30 | Discharge: 2016-07-30 | Disposition: A | Discharge status: Home / Self Care | Payer: BLUE CROSS/BLUE SHIELD | Source: Ambulatory Visit | Attending: Internal Medicine | Admitting: Internal Medicine

## 2016-07-30 DIAGNOSIS — R05 Cough: Secondary | ICD-10-CM | POA: Diagnosis not present

## 2016-07-30 DIAGNOSIS — R053 Chronic cough: Secondary | ICD-10-CM

## 2016-07-30 NOTE — Discharge Instructions (Signed)
Cataract Surgery, Care After Refer to this sheet in the next few weeks. These instructions provide you with information about caring for yourself after your procedure. Your health care provider may also give you more specific instructions. Your treatment has been planned according to current medical practices, but problems sometimes occur. Call your health care provider if you have any problems or questions after your procedure. What can I expect after the procedure? After the procedure, it is common to have:  Itching.  Discomfort.  Fluid discharge.  Sensitivity to light and to touch.  Bruising.  Follow these instructions at home: [place] your eye every day for signs of infection. Watch for: ? Redness, swelling, or pain. ? Fluid, blood, or pus. ? Warmth. ? Bad smell. Activity  Avoid strenuous activities, such as playing contact sports, for as long as told by your health care provider.  Do not drive or operate heavy machinery until your health care provider approves.  Do not bend or lift heavy objects. Bending increases pressure in the eye. You can walk, climb stairs, and do light household chores.  Ask your health care provider when you can return to work. If you work in a dusty environment, you may be advised to wear protective eyewear for a period of time. General instructions  Take or apply over-the-counter and prescription medicines only as told by your health care provider. This includes eye drops.  Do not touch or rub your eyes.  If you were given a protective shield, wear it as told by your health care provider. If you were not given a protective shield, wear sunglasses as told by your health care provider to protect your eyes.  Keep the area around your eye clean and dry. Avoid swimming or allowing water to hit you directly in the face while showering until told by your health care provider. Keep soap and shampoo out of your eyes.  Do not put a contact lens  into the affected eye or eyes until your health care provider approves.  Keep all follow-up visits as told by your health care provider. This is important. Contact a health care provider if:   You have increased bruising around your eye.  You have pain that is not helped with medicine.  You have a fever.  You have redness, swelling, or pain in your eye.  You have fluid, blood, or pus coming from your incision.  Your vision gets worse. Get help right away if:  You have sudden vision loss. This information is not intended to replace advice given to you by your health care provider. Make sure you discuss any questions you have with your health care provider. Document Released: 09/05/2004 Document Revised: 06/27/2015 Document Reviewed: 12/27/2014 Elsevier Interactive Patient Education  2017 [place] Anesthesia, Adult, Care After These instructions provide you with information about caring for yourself after your procedure. Your health care provider may also give you more specific instructions. Your treatment has been planned according to current medical practices, but problems sometimes occur. Call your health care provider if you have any problems or questions after your procedure. What can I expect after the procedure? After the procedure, it is common to have:  Vomiting.  A sore throat.  Mental slowness.  It is common to feel:  Nauseous.  Cold or shivery.  Sleepy.  Tired.  Sore or achy, even in parts of your body where you did not have surgery.  Follow these instructions at home: For  at least 24 hours after the procedure:  Do not: ? Participate in activities where you could fall or become injured. ? Drive. ? Use heavy machinery. ? Drink alcohol. ? Take sleeping pills or medicines that cause drowsiness. ? Make important decisions or sign legal documents. ? Take care of children on your own.  Rest. Eating and drinking  If you vomit, drink  water, juice, or soup when you can drink without vomiting.  Drink enough fluid to keep your urine clear or pale yellow.  Make sure you have little or no nausea before eating solid foods.  Follow the diet recommended by your health care provider. General instructions  Have a responsible adult stay with you until you are awake and alert.  Return to your normal activities as told by your health care provider. Ask your health care provider what activities are safe for you.  Take over-the-counter and prescription medicines only as told by your health care provider.  If you smoke, do not smoke without supervision.  Keep all follow-up visits as told by your health care provider. This is important. Contact a health care provider if:  You continue to have nausea or vomiting at home, and medicines are not helpful.  You cannot drink fluids or start eating again.  You cannot urinate after 8-12 hours.  You develop a skin rash.  You have fever.  You have increasing redness at the site of your procedure. Get help right away if:  You have difficulty breathing.  You have chest pain.  You have unexpected bleeding.  You feel that you are having a life-threatening or urgent problem. This information is not intended to replace advice given to you by your health care provider. Make sure you discuss any questions you have with your health care provider. Document Released: 05/25/2000 Document Revised: 07/22/2015 Document Reviewed: 01/31/2015 Elsevier Interactive Patient Education  [employer].

## 2016-08-05 ENCOUNTER — Ambulatory Visit: Payer: BLUE CROSS/BLUE SHIELD | Admitting: Anesthesiology

## 2016-08-05 ENCOUNTER — Ambulatory Visit
Admission: RE | Admit: 2016-08-05 | Discharge: 2016-08-05 | Disposition: A | Discharge status: Home / Self Care | Payer: BLUE CROSS/BLUE SHIELD | Source: Ambulatory Visit | Attending: Ophthalmology | Admitting: Ophthalmology

## 2016-08-05 ENCOUNTER — Encounter
Admission: RE | Disposition: A | Discharge status: Home / Self Care | Payer: Self-pay | Source: Ambulatory Visit | Attending: Ophthalmology

## 2016-08-05 DIAGNOSIS — M1990 Unspecified osteoarthritis, unspecified site: Secondary | ICD-10-CM | POA: Diagnosis not present

## 2016-08-05 DIAGNOSIS — Z8719 Personal history of other diseases of the digestive system: Secondary | ICD-10-CM | POA: Insufficient documentation

## 2016-08-05 DIAGNOSIS — Z880 Allergy status to penicillin: Secondary | ICD-10-CM | POA: Diagnosis not present

## 2016-08-05 DIAGNOSIS — F419 Anxiety disorder, unspecified: Secondary | ICD-10-CM | POA: Insufficient documentation

## 2016-08-05 DIAGNOSIS — Z888 Allergy status to other drugs, medicaments and biological substances status: Secondary | ICD-10-CM | POA: Diagnosis not present

## 2016-08-05 DIAGNOSIS — J449 Chronic obstructive pulmonary disease, unspecified: Secondary | ICD-10-CM | POA: Diagnosis not present

## 2016-08-05 DIAGNOSIS — E7800 Pure hypercholesterolemia, unspecified: Secondary | ICD-10-CM | POA: Diagnosis not present

## 2016-08-05 DIAGNOSIS — R0601 Orthopnea: Secondary | ICD-10-CM | POA: Insufficient documentation

## 2016-08-05 DIAGNOSIS — K219 Gastro-esophageal reflux disease without esophagitis: Secondary | ICD-10-CM | POA: Insufficient documentation

## 2016-08-05 DIAGNOSIS — G2581 Restless legs syndrome: Secondary | ICD-10-CM | POA: Diagnosis not present

## 2016-08-05 DIAGNOSIS — Z87891 Personal history of nicotine dependence: Secondary | ICD-10-CM | POA: Diagnosis not present

## 2016-08-05 DIAGNOSIS — I10 Essential (primary) hypertension: Secondary | ICD-10-CM | POA: Insufficient documentation

## 2016-08-05 DIAGNOSIS — K5090 Crohn's disease, unspecified, without complications: Secondary | ICD-10-CM | POA: Diagnosis not present

## 2016-08-05 DIAGNOSIS — H2512 Age-related nuclear cataract, left eye: Secondary | ICD-10-CM | POA: Insufficient documentation

## 2016-08-05 DIAGNOSIS — Z6841 Body Mass Index (BMI) 40.0 and over, adult: Secondary | ICD-10-CM | POA: Insufficient documentation

## 2016-08-05 HISTORY — PX: CATARACT EXTRACTION W/PHACO: SHX586

## 2016-08-05 SURGERY — PHACOEMULSIFICATION, CATARACT, WITH IOL INSERTION
Anesthesia: Monitor Anesthesia Care | Site: Eye | Laterality: Left | Wound class: Clean

## 2016-08-05 MED ORDER — ONDANSETRON HCL 4 MG/2ML IJ SOLN: 4.0000 mg | Freq: Once | INTRAMUSCULAR | Status: DC | PRN

## 2016-08-05 MED ORDER — MOXIFLOXACIN HCL 0.5 % OP SOLN
OPHTHALMIC | Status: DC | PRN
  Administered 2016-08-05: .3 mL via OPHTHALMIC

## 2016-08-05 MED ORDER — LIDOCAINE HCL (PF) 2 % IJ SOLN
INTRAOCULAR | Status: DC | PRN
  Administered 2016-08-05: 1 mL via INTRAOCULAR

## 2016-08-05 MED ORDER — EPINEPHRINE PF 1 MG/ML IJ SOLN
INTRAOCULAR | Status: DC | PRN
  Administered 2016-08-05: 65 mL via OPHTHALMIC

## 2016-08-05 MED ORDER — ACETAMINOPHEN 325 MG PO TABS: 325.0000 mg | ORAL_TABLET | ORAL | Status: DC | PRN

## 2016-08-05 MED ORDER — BRIMONIDINE TARTRATE-TIMOLOL 0.2-0.5 % OP SOLN
OPHTHALMIC | Status: DC | PRN
  Administered 2016-08-05: 1 [drp] via OPHTHALMIC

## 2016-08-05 MED ORDER — MOXIFLOXACIN HCL 0.5 % OP SOLN
1.0000 [drp] | OPHTHALMIC | Status: DC | PRN
  Administered 2016-08-05 (×3): 1 [drp] via OPHTHALMIC

## 2016-08-05 MED ORDER — FENTANYL CITRATE (PF) 100 MCG/2ML IJ SOLN
INTRAMUSCULAR | Status: DC | PRN
  Administered 2016-08-05: 100 ug via INTRAVENOUS

## 2016-08-05 MED ORDER — NA HYALUR & NA CHOND-NA HYALUR 0.4-0.35 ML IO KIT
PACK | INTRAOCULAR | Status: DC | PRN
  Administered 2016-08-05: 1 mL via INTRAOCULAR

## 2016-08-05 MED ORDER — ACETAMINOPHEN 160 MG/5ML PO SOLN: 325.0000 mg | ORAL | Status: DC | PRN

## 2016-08-05 MED ORDER — MIDAZOLAM HCL 2 MG/2ML IJ SOLN
INTRAMUSCULAR | Status: DC | PRN
  Administered 2016-08-05: 2 mg via INTRAVENOUS

## 2016-08-05 MED ORDER — ARMC OPHTHALMIC DILATING DROPS
1.0000 "application " | OPHTHALMIC | Status: DC | PRN
  Administered 2016-08-05 (×2): 1 via OPHTHALMIC

## 2016-08-05 SURGICAL SUPPLY — 27 items
CANNULA ANT/CHMB 27GA (MISCELLANEOUS) ×3 IMPLANT
CARTRIDGE ABBOTT (MISCELLANEOUS) IMPLANT
GLOVE SURG LX 7.5 STRW (GLOVE) ×2
GLOVE SURG LX STRL 7.5 STRW (GLOVE) ×1 IMPLANT
GLOVE SURG TRIUMPH 8.0 PF LTX (GLOVE) ×3 IMPLANT
GOWN STRL REUS W/ TWL LRG LVL3 (GOWN DISPOSABLE) ×2 IMPLANT
GOWN STRL REUS W/TWL LRG LVL3 (GOWN DISPOSABLE) ×4
LENS IOL ACRSF IQ TRC 6 16.5 (Intraocular Lens) ×1 IMPLANT
LENS IOL ACRYSOF IQ TORIC 16.5 (Intraocular Lens) ×2 IMPLANT
LENS IOL IQ TORIC 6 16.5 (Intraocular Lens) ×1 IMPLANT
MARKER SKIN DUAL TIP RULER LAB (MISCELLANEOUS) ×3 IMPLANT
NDL RETROBULBAR .5 NSTRL (NEEDLE) IMPLANT
NEEDLE FILTER BLUNT 18X 1/2SAF (NEEDLE) ×2
NEEDLE FILTER BLUNT 18X1 1/2 (NEEDLE) ×1 IMPLANT
PACK CATARACT BRASINGTON (MISCELLANEOUS) ×3 IMPLANT
PACK EYE AFTER SURG (MISCELLANEOUS) ×3 IMPLANT
PACK OPTHALMIC (MISCELLANEOUS) ×3 IMPLANT
RING MALYGIN 7.0 (MISCELLANEOUS) IMPLANT
SUT ETHILON 10-0 CS-B-6CS-B-6 (SUTURE)
SUT VICRYL  9 0 (SUTURE)
SUT VICRYL 9 0 (SUTURE) IMPLANT
SUTURE EHLN 10-0 CS-B-6CS-B-6 (SUTURE) IMPLANT
SYR 3ML LL SCALE MARK (SYRINGE) ×3 IMPLANT
SYR 5ML LL (SYRINGE) ×3 IMPLANT
SYR TB 1ML LUER SLIP (SYRINGE) ×3 IMPLANT
WATER STERILE IRR 250ML POUR (IV SOLUTION) ×3 IMPLANT
WIPE NON LINTING 3.25X3.25 (MISCELLANEOUS) ×3 IMPLANT

## 2016-08-05 NOTE — Anesthesia Preprocedure Evaluation (Signed)
Anesthesia Evaluation  Patient identified by MRN, date of birth, ID band Patient awake    Reviewed: Allergy & Precautions, NPO status , Patient's Chart, lab work & pertinent test results  Airway Mallampati: II  TM Distance: >3 FB Neck ROM: Full    Dental no notable dental hx.    Pulmonary shortness of breath, with exertion and lying, COPD, former smoker,           Cardiovascular hypertension, Pt. on medications and Pt. on home beta blockers + Orthopnea  Normal cardiovascular exam     Neuro/Psych  Headaches, PSYCHIATRIC DISORDERS Anxiety    GI/Hepatic Neg liver ROS, GERD  Medicated and Controlled,  Endo/Other  Morbid obesity  Renal/GU negative Renal ROS     Musculoskeletal  (+) Arthritis ,   Abdominal (+) + obese,   Peds  Hematology negative hematology ROS (+)   Anesthesia Other Findings   Reproductive/Obstetrics                             Anesthesia Physical  Anesthesia Plan  ASA: III  Anesthesia Plan: MAC   Post-op Pain Management:    Induction: Intravenous  PONV Risk Score and Plan:   Airway Management Planned:   Additional Equipment:   Intra-op Plan:   Post-operative Plan:   Informed Consent: I have reviewed the patients History and Physical, chart, labs and discussed the procedure including the risks, benefits and alternatives for the proposed anesthesia with the patient or authorized representative who has indicated his/her understanding and acceptance.     Plan Discussed with: CRNA  Anesthesia Plan Comments:         Anesthesia Quick Evaluation

## 2016-08-05 NOTE — Transfer of Care (Signed)
Immediate Anesthesia Transfer of Care Note  Patient: Ernest Sims  Procedure(s) Performed: Procedure(s) with comments: CATARACT EXTRACTION PHACO AND INTRAOCULAR LENS PLACEMENT ([place])  left toric lens (Left) - Toric lens  Patient Location: PACU  Anesthesia Type: MAC  Level of Consciousness: awake, alert  and patient cooperative  Airway and Oxygen Therapy: Patient Spontanous Breathing and Patient connected to supplemental oxygen  Post-op Assessment: Post-op Vital signs reviewed, Patient's Cardiovascular Status Stable, Respiratory Function Stable, Patent Airway and No signs of Nausea or vomiting  Post-op Vital Signs: Reviewed and stable  Complications: No apparent anesthesia complications

## 2016-08-05 NOTE — Op Note (Signed)
LOCATION:  [place]   PREOPERATIVE DIAGNOSIS:  Nuclear sclerotic cataract of the left eye.  H25.12  POSTOPERATIVE DIAGNOSIS:  Nuclear sclerotic cataract of the left eye.   PROCEDURE:  Phacoemulsification with Toric posterior chamber intraocular lens placement of the left eye.   LENS:  Implant Name Type Inv. Item Serial No. Manufacturer Lot No. LRB No. Used  acrysof IQ  16.5D Intraocular Lens   [id number] ALCON   Left 1   [id number] 16.5 DToric intraocular lens with 3.75 diopters of cylindrical power with axis orientation at 4 degrees.   ULTRASOUND TIME: 14 % of 1 minutes, 4 seconds.  CDE 9.0   SURGEON:  Wyonia Hough, MD   ANESTHESIA:  Topical with tetracaine drops and 2% Xylocaine jelly, augmented with 1% preservative-free intracameral lidocaine.  COMPLICATIONS:  None.   DESCRIPTION OF PROCEDURE:  The patient was identified in the holding room and transported to the operating suite and placed in the supine position under the operating microscope.  The left eye was identified as the operative eye, and it was prepped and draped in the usual sterile ophthalmic fashion.    A clear-corneal paracentesis incision was made at the 1:30 position.  0.5 ml of preservative-free 1% lidocaine was injected into the anterior chamber. The anterior chamber was filled with Viscoat.  A 2.4 millimeter near clear corneal incision was then made at the 10:30 position.  A cystotome and capsulorrhexis forceps were then used to make a curvilinear capsulorrhexis.  Hydrodissection and hydrodelineation were then performed using balanced salt solution.   Phacoemulsification was then used in stop and chop fashion to remove the lens, nucleus and epinucleus.  The remaining cortex was aspirated using the irrigation and aspiration handpiece.  Provisc viscoelastic was then placed into the capsular bag to distend it for lens placement.  The Verion digital marker was used to align the implant at the intended  axis.   A 16.5 diopter lens was then injected into the capsular bag.  It was rotated clockwise until the axis marks on the lens were approximately 15 degrees in the counterclockwise direction to the intended alignment.  The viscoelastic was aspirated from the eye using the irrigation aspiration handpiece.  Then, a Koch spatula through the sideport incision was used to rotate the lens in a clockwise direction until the axis markings of the intraocular lens were lined up with the Verion alignment.  Balanced salt solution was then used to hydrate the wounds. Vigamox 0.2 ml of a 5m per ml solution was injected into the anterior chamber for a dose of 0.2 mg of intracameral antibiotic at the completion of the case.    The eye was noted to have a physiologic pressure and there was no wound leak noted.   Timolol and Brimonidine drops were applied to the eye.  The patient was taken to the recovery room in stable condition having had no complications of anesthesia or surgery.  Ernest Sims 08/05/2016, 1:07 PM

## 2016-08-05 NOTE — Anesthesia Procedure Notes (Signed)
Procedure Name: MAC Performed by: Shaundrea Carrigg Pre-anesthesia Checklist: Patient identified, Emergency Drugs available, Suction available, Timeout performed and Patient being monitored Patient Re-evaluated:Patient Re-evaluated prior to inductionOxygen Delivery Method: Nasal cannula Placement Confirmation: positive ETCO2     

## 2016-08-05 NOTE — H&P (Signed)
The History and Physical notes are on paper, have been signed, and are to be scanned. The patient remains stable and unchanged from the H&P.   Previous H&P reviewed, patient examined, and there are no changes.  Ernest Sims 08/05/2016 12:03 PM

## 2016-08-05 NOTE — Anesthesia Postprocedure Evaluation (Signed)
Anesthesia Post Note  Patient: Ernest Sims  Procedure(s) Performed: Procedure(s) (LRB): CATARACT EXTRACTION PHACO AND INTRAOCULAR LENS PLACEMENT ([place])  left toric lens (Left)  Patient location during evaluation: PACU Anesthesia Type: MAC Level of consciousness: awake and alert Pain management: pain level controlled Vital Signs Assessment: post-procedure vital signs reviewed and stable Respiratory status: spontaneous breathing, nonlabored ventilation and respiratory function stable Cardiovascular status: stable Postop Assessment: no signs of nausea or vomiting Anesthetic complications: no    Veda Canning

## 2016-08-06 ENCOUNTER — Encounter: Payer: Self-pay | Admitting: Ophthalmology

## 2016-09-15 ENCOUNTER — Encounter: Payer: Self-pay | Admitting: Emergency Medicine

## 2016-09-15 ENCOUNTER — Emergency Department
Admission: EM | Admit: 2016-09-15 | Discharge: 2016-09-15 | Disposition: A | Discharge status: Home / Self Care | Payer: BLUE CROSS/BLUE SHIELD | Attending: Emergency Medicine | Admitting: Emergency Medicine

## 2016-09-15 ENCOUNTER — Emergency Department: Payer: BLUE CROSS/BLUE SHIELD

## 2016-09-15 DIAGNOSIS — Z87891 Personal history of nicotine dependence: Secondary | ICD-10-CM | POA: Insufficient documentation

## 2016-09-15 DIAGNOSIS — Z7982 Long term (current) use of aspirin: Secondary | ICD-10-CM | POA: Insufficient documentation

## 2016-09-15 DIAGNOSIS — J449 Chronic obstructive pulmonary disease, unspecified: Secondary | ICD-10-CM | POA: Diagnosis not present

## 2016-09-15 DIAGNOSIS — Z79899 Other long term (current) drug therapy: Secondary | ICD-10-CM | POA: Diagnosis not present

## 2016-09-15 DIAGNOSIS — R609 Edema, unspecified: Secondary | ICD-10-CM | POA: Diagnosis not present

## 2016-09-15 DIAGNOSIS — L03116 Cellulitis of left lower limb: Secondary | ICD-10-CM | POA: Insufficient documentation

## 2016-09-15 DIAGNOSIS — I10 Essential (primary) hypertension: Secondary | ICD-10-CM | POA: Insufficient documentation

## 2016-09-15 DIAGNOSIS — R2242 Localized swelling, mass and lump, left lower limb: Secondary | ICD-10-CM | POA: Diagnosis present

## 2016-09-15 LAB — COMPREHENSIVE METABOLIC PANEL
ALT: 15 U/L — AB (ref 17–63)
AST: 20 U/L (ref 15–41)
Albumin: 4.2 g/dL (ref 3.5–5.0)
Alkaline Phosphatase: 102 U/L (ref 38–126)
Anion gap: 10 (ref 5–15)
BUN: 41 mg/dL — ABNORMAL HIGH (ref 6–20)
CHLORIDE: 110 mmol/L (ref 101–111)
CO2: 20 mmol/L — AB (ref 22–32)
CREATININE: 1.35 mg/dL — AB (ref 0.61–1.24)
Calcium: 9.1 mg/dL (ref 8.9–10.3)
GFR calc non Af Amer: 54 mL/min — ABNORMAL LOW (ref >60)
Glucose, Bld: 115 mg/dL — ABNORMAL HIGH (ref 65–99)
Potassium: 4.9 mmol/L (ref 3.5–5.1)
SODIUM: 140 mmol/L (ref 135–145)
Total Bilirubin: 0.5 mg/dL (ref 0.3–1.2)
Total Protein: 7.9 g/dL (ref 6.5–8.1)

## 2016-09-15 LAB — CBC WITH DIFFERENTIAL/PLATELET
Basophils Absolute: 0.1 10*3/uL (ref 0–0.1)
Basophils Relative: 1 %
EOS ABS: 0.3 10*3/uL (ref 0–0.7)
Eosinophils Relative: 4 %
HEMATOCRIT: 42.8 % (ref 40.0–52.0)
HEMOGLOBIN: 13.8 g/dL (ref 13.0–18.0)
LYMPHS PCT: 13 %
Lymphs Abs: 1.1 10*3/uL (ref 1.0–3.6)
MCH: 27.7 pg (ref 26.0–34.0)
MCHC: 32.2 g/dL (ref 32.0–36.0)
MCV: 86 fL (ref 80.0–100.0)
MONOS PCT: 9 %
Monocytes Absolute: 0.7 10*3/uL (ref 0.2–1.0)
NEUTROS PCT: 73 %
Neutro Abs: 6 10*3/uL (ref 1.4–6.5)
PLATELETS: 220 10*3/uL (ref 150–440)
RBC: 4.97 MIL/uL (ref 4.40–5.90)
RDW: 16 % — AB (ref 11.5–14.5)
WBC: 8.2 10*3/uL (ref 3.8–10.6)

## 2016-09-15 MED ORDER — OXYCODONE-ACETAMINOPHEN 5-325 MG PO TABS
1.0000 | ORAL_TABLET | Freq: Once | ORAL | Status: AC
  Administered 2016-09-15: 1 via ORAL
  Filled 2016-09-15: qty 1

## 2016-09-15 MED ORDER — CLINDAMYCIN HCL 150 MG PO CAPS
450.0000 mg | ORAL_CAPSULE | Freq: Once | ORAL | Status: AC
  Administered 2016-09-15: 450 mg via ORAL
  Filled 2016-09-15: qty 3

## 2016-09-15 MED ORDER — OXYCODONE-ACETAMINOPHEN 5-325 MG PO TABS: 1.0000 | ORAL_TABLET | Freq: Four times a day (QID) | ORAL | 0 refills | Status: DC | PRN

## 2016-09-15 MED ORDER — CLINDAMYCIN HCL 150 MG PO CAPS: 450.0000 mg | ORAL_CAPSULE | Freq: Four times a day (QID) | ORAL | 0 refills | Status: DC

## 2016-09-15 NOTE — ED Provider Notes (Signed)
[facility] Emergency Department Provider Note  ____________________________________________  Time seen: Approximately 9:56 PM  I have reviewed the triage vital signs and the nursing notes.   HISTORY  Chief Complaint Leg Swelling    HPI Ernest Sims is a 61 y.o. male who complains of worsening swelling in the left lower extremity over the past 2-3 weeks. This is in the setting of having chronic peripheral edema over the past 3 months, for which he takes Lasix. Denies any acute chest pain or shortness of breath. He is at his chronic baseline regarding these symptoms, does not have any new orthopnea PND or exertional dyspnea. No fevers or chills or dizziness. Does report that he has noticed multiple open sores on the left calf area that are weepy.Left leg pain is worse with movement and standing, no alleviating factors. Moderate intensity, constant waxing and waning. Nonradiating     Past Medical History:  Diagnosis Date  . Anxiety   . Arthritis   . Colitis   . COPD (chronic obstructive pulmonary disease) (HCC)    2.5 L O2 AT NIGHT  . Crohn disease ([place])   . Diverticulosis   . Dyspnea    OFTEN  . GERD (gastroesophageal reflux disease)   . Hypercholesteremia   . Hypertension   . Migraines   . Motion sickness   . Orthopnea    sleeps in chair  . Restless leg syndrome   . Seasonal allergies      Patient Active Problem List   Diagnosis Date Noted  . CVA (cerebral infarction) 04/26/2015  . Acid reflux 07/12/2014  . Hoarseness 07/12/2014  . Shortness of breath 05/22/2014  . Morbid obesity ([place]) 05/22/2014  . Physical deconditioning 05/22/2014     Past Surgical History:  Procedure Laterality Date  . CATARACT EXTRACTION W/PHACO Right 07/22/2016   Procedure: CATARACT EXTRACTION PHACO AND INTRAOCULAR LENS PLACEMENT ([place]) RIGHT;  Surgeon: Leandrew Koyanagi, MD;  Location: [place];  Service: Ophthalmology;  Laterality: Right;  IVA  TOPICAL RIGHT TORIC  . CATARACT EXTRACTION W/PHACO Left 08/05/2016   Procedure: CATARACT EXTRACTION PHACO AND INTRAOCULAR LENS PLACEMENT ([place])  left toric lens;  Surgeon: Leandrew Koyanagi, MD;  Location: [place];  Service: Ophthalmology;  Laterality: Left;  Toric lens  . COLONOSCOPY WITH PROPOFOL N/A 02/28/2015   Procedure: COLONOSCOPY WITH PROPOFOL;  Surgeon: Manya Silvas, MD;  Location: [facility] ENDOSCOPY;  Service: Endoscopy;  Laterality: N/A;  . ESOPHAGOGASTRODUODENOSCOPY (EGD) WITH PROPOFOL N/A 02/28/2015   Procedure: ESOPHAGOGASTRODUODENOSCOPY (EGD) WITH PROPOFOL;  Surgeon: Manya Silvas, MD;  Location: [facility] ENDOSCOPY;  Service: Endoscopy;  Laterality: N/A;  . JOINT REPLACEMENT  2002   hip  . PILONIDAL CYST EXCISION  1971  . REVISION TOTAL HIP ARTHROPLASTY  2003  . ROTATOR CUFF REPAIR  2016  . ruptured disc  1984     Prior to Admission medications   Medication Sig Start Date End Date Taking? Authorizing Provider  allopurinol (ZYLOPRIM) 300 MG tablet Take 300 mg by mouth as needed.     [provider]  amitriptyline (ELAVIL) 25 MG tablet Take 25 mg by mouth at bedtime.    [provider]  aspirin EC 81 MG EC tablet Take 1 tablet (81 mg total) by mouth daily. 04/29/15   Hillary Bow, MD  budesonide (ENTOCORT EC) 3 MG 24 hr capsule Take by mouth daily.    [provider]  clindamycin (CLEOCIN) 150 MG capsule Take 3 capsules (450 mg total) by mouth 4 (  four) times daily. 09/15/16   Carrie Mew, MD  diazepam (VALIUM) 10 MG tablet Take 1 tablet (10 mg total) by mouth every 8 (eight) hours as needed for anxiety. 04/29/15   Hillary Bow, MD  furosemide (LASIX) 40 MG tablet Take 40 mg by mouth daily.    [provider]  hydrocortisone valerate ointment (WESTCORT) 0.2 % Apply to affected area daily 05/20/16 05/20/17  Sable Feil, PA-C  latanoprost (XALATAN) 0.005 % ophthalmic solution Place 1 drop into both eyes at bedtime.     [provider]  olmesartan (BENICAR) 20 MG tablet Take 20 mg by mouth daily.    [provider]  omeprazole (PRILOSEC) 40 MG capsule Take 40 mg by mouth 2 (two) times daily.     [provider]  oxyCODONE (OXY IR/ROXICODONE) 5 MG immediate release tablet Take 2 tablets (10 mg total) by mouth every 4 (four) hours as needed for moderate pain or severe pain. 04/29/15   Hillary Bow, MD  oxyCODONE-acetaminophen (ROXICET) 5-325 MG tablet Take 1 tablet by mouth every 6 (six) hours as needed for severe pain. 09/15/16   Carrie Mew, MD  OXYGEN Inhale 2.5 L into the lungs as needed.    [provider]  pramipexole (MIRAPEX) 0.5 MG tablet Take 0.5 mg by mouth 2 (two) times daily.    [provider]  pravastatin (PRAVACHOL) 40 MG tablet Take 40 mg by mouth at bedtime.     [provider]  propranolol (INDERAL) 40 MG tablet Take 40 mg by mouth 2 (two) times daily. 11/20/13   [provider]  timolol (TIMOPTIC) 0.5 % ophthalmic solution Place 1 drop into both eyes 2 (two) times daily.    [provider]  topiramate (TOPAMAX) 100 MG tablet Take 100 mg by mouth 2 (two) times daily.    [provider]  triamcinolone (NASACORT) 55 MCG/ACT AERO nasal inhaler Place 2 sprays into the nose 2 (two) times daily as needed.     [provider]  zolpidem (AMBIEN) 10 MG tablet Take 1 tablet (10 mg total) by mouth at bedtime as needed for sleep. 04/29/15   Hillary Bow, MD     Allergies Etodolac; Penicillins; and Tape   Family History  Problem Relation Age of Onset  . Lung disease Father   . Heart disease Father   . Lung disease Mother   . Cancer Brother        liver  . Cancer Maternal Aunt     Social History Social History  Substance Use Topics  . Smoking status: Former Smoker    Packs/day: 3.00    Years: 24.00    Types: Cigarettes    Quit date: 03/08/1988  . Smokeless tobacco: Never Used  . Alcohol use 0.0  oz/week     Comment: occasional    Review of Systems  Constitutional:   No fever or chills.  ENT:   No sore throat. No rhinorrhea. Cardiovascular:   No chest pain or syncope. Respiratory:   No dyspnea or cough. Gastrointestinal:   Negative for abdominal pain, vomiting and diarrhea.  Musculoskeletal:   Left leg pain and swelling as above. Chronic right leg swelling. All other systems reviewed and are negative except as documented above in ROS and HPI.  ____________________________________________   PHYSICAL EXAM:  VITAL SIGNS: ED Triage Vitals  Enc Vitals Group     BP 09/15/16 1756 112/66     Pulse Rate 09/15/16 1756 73     Resp  09/15/16 1756 16     Temp 09/15/16 1756 (!) 97.5 F (36.4 C)     Temp Source 09/15/16 1756 Oral     SpO2 09/15/16 1756 97 %     Weight 09/15/16 1759 (!) 331 lb (150.1 kg)     Height --      Head Circumference --      Peak Flow --      Pain Score 09/15/16 1759 5     Pain Loc --      Pain Edu? --      Excl. in [place]? --     Vital signs reviewed, nursing assessments reviewed.   Constitutional:   Alert and oriented. Well appearing and in no distress. Eyes:   No scleral icterus.  EOMI. No nystagmus. No conjunctival pallor. PERRL. ENT   Head:   Normocephalic and atraumatic.   Nose:   No congestion/rhinnorhea.    Mouth/Throat:   MMM, no pharyngeal erythema. No peritonsillar mass.    Neck:   No meningismus. Full ROM Hematological/Lymphatic/Immunilogical:   No cervical lymphadenopathy. Cardiovascular:   RRR. Symmetric bilateral radial and DP pulses.  No murmurs.  Respiratory:   Normal respiratory effort without tachypnea/retractions. Breath sounds are clear and equal bilaterally. No wheezes/rales/rhonchi. Gastrointestinal:   Soft and nontender. Non distended. There is no CVA tenderness.  No rebound, rigidity, or guarding. Genitourinary:   deferred Musculoskeletal:   Normal range of motion in all extremities.Positive pitting edema bilateral  lower extremity, left greater than right with asymmetric calf circumference. There is a small superficial lesion on the right lateral shin which is not indurated or weepy. There are multiple small superficial lesions on the left medial calf which have glossy, crusted appearance consistent with strep. These areas are indurated raised and tender. No purulent drainage. No fluctuance crepitus or lymphangitis. There is a broader erythematous area spanning approximate 40% of the circumference of the left lower leg.[place] Kitchen Neurologic:   Normal speech and language.  Motor grossly intact. No gross focal neurologic deficits are appreciated.  Skin:    Skin is warm, dry with infectious changes as above..  ____________________________________________    LABS (pertinent positives/negatives) (all labs ordered are listed, but only abnormal results are displayed) Labs Reviewed  COMPREHENSIVE METABOLIC PANEL - Abnormal; Notable for the following:       Result Value   CO2 20 (*)    Glucose, Bld 115 (*)    BUN 41 (*)    Creatinine, Ser 1.35 (*)    ALT 15 (*)    GFR calc non Af Amer 54 (*)    All other components within normal limits  CBC WITH DIFFERENTIAL/PLATELET - Abnormal; Notable for the following:    RDW 16.0 (*)    All other components within normal limits  CBC WITH DIFFERENTIAL/PLATELET   ____________________________________________   EKG    ____________________________________________    RADIOLOGY  US Venous Img Lower Bilateral  Result Date: 09/15/2016 CLINICAL DATA:  Initial evaluation for worsening bilateral lower extremity edema, left greater than right. Open sores on left calf. The EXAM: BILATERAL LOWER EXTREMITY VENOUS DOPPLER ULTRASOUND TECHNIQUE: Gray-scale sonography with graded compression, as well as color Doppler and duplex ultrasound were performed to evaluate the lower extremity deep venous systems from the level of the common femoral vein and including the common femoral,  femoral, profunda femoral, popliteal and calf veins including the posterior tibial, peroneal and gastrocnemius veins when visible. The superficial great saphenous vein was also interrogated. Spectral Doppler was utilized to  evaluate flow at rest and with distal augmentation maneuvers in the common femoral, femoral and popliteal veins. COMPARISON:  None. FINDINGS: RIGHT LOWER EXTREMITY Common Femoral Vein: No evidence of thrombus. Normal compressibility, respiratory phasicity and response to augmentation. Saphenofemoral Junction: No evidence of thrombus. Normal compressibility and flow on color Doppler imaging. Profunda Femoral Vein: No evidence of thrombus. Normal compressibility and flow on color Doppler imaging. Femoral Vein: No evidence of thrombus. Normal compressibility, respiratory phasicity and response to augmentation. Popliteal Vein: No evidence of thrombus. Normal compressibility, respiratory phasicity and response to augmentation. Calf Veins: No evidence of thrombus. Normal compressibility and flow on color Doppler imaging. Superficial Great Saphenous Vein: No evidence of thrombus. Normal compressibility and flow on color Doppler imaging. Venous Reflux:  None. Other Findings:  None. LEFT LOWER EXTREMITY Common Femoral Vein: No evidence of thrombus. Normal compressibility, respiratory phasicity and response to augmentation. Saphenofemoral Junction: No evidence of thrombus. Normal compressibility and flow on color Doppler imaging. Profunda Femoral Vein: No evidence of thrombus. Normal compressibility and flow on color Doppler imaging. Femoral Vein: No evidence of thrombus. Normal compressibility, respiratory phasicity and response to augmentation. Popliteal Vein: No evidence of thrombus. Normal compressibility, respiratory phasicity and response to augmentation. Calf Veins: No evidence of thrombus. Normal compressibility and flow on color Doppler imaging. Superficial Great Saphenous Vein: No evidence of  thrombus. Normal compressibility and flow on color Doppler imaging. Venous Reflux:  None. Other Findings:  Examination mildly limited by body habitus. IMPRESSION: No evidence of DVT within either lower extremity. Electronically Signed   By: Jeannine Boga M.D.   On: 09/15/2016 21:23    ____________________________________________   PROCEDURES Procedures  ____________________________________________   INITIAL IMPRESSION / ASSESSMENT AND PLAN / ED COURSE  Pertinent labs & imaging results that were available during my care of the patient were reviewed by me and considered in my medical decision making (see chart for details).  Patient well appearing no acute distress, presents with acute pain and swelling of the left leg, consistent with cellulitis worsening his lymphedema. Ultrasound negative for DVT. No symptoms of venous thromboembolism. No evidence of bacteremia or sepsis. Patient is suitable for outpatient follow-up with oral antibiotic. Given a dose of vancomycin in the ED and I'll prescribe it as well given his history of penicillin allergy with unknown reaction. Follow closely with primary care. Low suspicion for necrotizing fasciitis osteomyelitis or abscess. No evidence of compartment syndrome.      ____________________________________________   FINAL CLINICAL IMPRESSION(S) / ED DIAGNOSES  Final diagnoses:  Peripheral edema  Cellulitis of left lower extremity      New Prescriptions   CLINDAMYCIN (CLEOCIN) 150 MG CAPSULE    Take 3 capsules (450 mg total) by mouth 4 (four) times daily.   OXYCODONE-ACETAMINOPHEN (ROXICET) 5-325 MG TABLET    Take 1 tablet by mouth every 6 (six) hours as needed for severe pain.     Portions of this note were generated with dragon dictation software. Dictation errors may occur despite best attempts at proofreading.    Carrie Mew, MD 09/15/16 2201

## 2016-09-15 NOTE — Discharge Instructions (Signed)
Results for orders placed or performed during the hospital encounter of 09/15/16  Comprehensive metabolic panel  Result Value Ref Range   Sodium 140 135 - 145 mmol/L   Potassium 4.9 3.5 - 5.1 mmol/L   Chloride 110 101 - 111 mmol/L   CO2 20 (L) 22 - 32 mmol/L   Glucose, Bld 115 (H) 65 - 99 mg/dL   BUN 41 (H) 6 - 20 mg/dL   Creatinine, Ser 1.35 (H) 0.61 - 1.24 mg/dL   Calcium 9.1 8.9 - 10.3 mg/dL   Total Protein 7.9 6.5 - 8.1 g/dL   Albumin 4.2 3.5 - 5.0 g/dL   AST 20 15 - 41 U/L   ALT 15 (L) 17 - 63 U/L   Alkaline Phosphatase 102 38 - 126 U/L   Total Bilirubin 0.5 0.3 - 1.2 mg/dL   GFR calc non Af Amer 54 (L) >60 mL/min   GFR calc Af Amer >60 >60 mL/min   Anion gap 10 5 - 15  CBC with Differential/Platelet  Result Value Ref Range   WBC 8.2 3.8 - 10.6 K/uL   RBC 4.97 4.40 - 5.90 MIL/uL   Hemoglobin 13.8 13.0 - 18.0 g/dL   HCT 42.8 40.0 - 52.0 %   MCV 86.0 80.0 - 100.0 fL   MCH 27.7 26.0 - 34.0 pg   MCHC 32.2 32.0 - 36.0 g/dL   RDW 16.0 (H) 11.5 - 14.5 %   Platelets 220 150 - 440 K/uL   Neutrophils Relative % 73 %   Neutro Abs 6.0 1.4 - 6.5 K/uL   Lymphocytes Relative 13 %   Lymphs Abs 1.1 1.0 - 3.6 K/uL   Monocytes Relative 9 %   Monocytes Absolute 0.7 0.2 - 1.0 K/uL   Eosinophils Relative 4 %   Eosinophils Absolute 0.3 0 - 0.7 K/uL   Basophils Relative 1 %   Basophils Absolute 0.1 0 - 0.1 K/uL   US Venous Img Lower Bilateral  Result Date: 09/15/2016 CLINICAL DATA:  Initial evaluation for worsening bilateral lower extremity edema, left greater than right. Open sores on left calf. The EXAM: BILATERAL LOWER EXTREMITY VENOUS DOPPLER ULTRASOUND TECHNIQUE: Gray-scale sonography with graded compression, as well as color Doppler and duplex ultrasound were performed to evaluate the lower extremity deep venous systems from the level of the common femoral vein and including the common femoral, femoral, profunda femoral, popliteal and calf veins including the posterior tibial, peroneal  and gastrocnemius veins when visible. The superficial great saphenous vein was also interrogated. Spectral Doppler was utilized to evaluate flow at rest and with distal augmentation maneuvers in the common femoral, femoral and popliteal veins. COMPARISON:  None. FINDINGS: RIGHT LOWER EXTREMITY Common Femoral Vein: No evidence of thrombus. Normal compressibility, respiratory phasicity and response to augmentation. Saphenofemoral Junction: No evidence of thrombus. Normal compressibility and flow on color Doppler imaging. Profunda Femoral Vein: No evidence of thrombus. Normal compressibility and flow on color Doppler imaging. Femoral Vein: No evidence of thrombus. Normal compressibility, respiratory phasicity and response to augmentation. Popliteal Vein: No evidence of thrombus. Normal compressibility, respiratory phasicity and response to augmentation. Calf Veins: No evidence of thrombus. Normal compressibility and flow on color Doppler imaging. Superficial Great Saphenous Vein: No evidence of thrombus. Normal compressibility and flow on color Doppler imaging. Venous Reflux:  None. Other Findings:  None. LEFT LOWER EXTREMITY Common Femoral Vein: No evidence of thrombus. Normal compressibility, respiratory phasicity and response to augmentation. Saphenofemoral Junction: No evidence of thrombus. Normal compressibility and flow on color  Doppler imaging. Profunda Femoral Vein: No evidence of thrombus. Normal compressibility and flow on color Doppler imaging. Femoral Vein: No evidence of thrombus. Normal compressibility, respiratory phasicity and response to augmentation. Popliteal Vein: No evidence of thrombus. Normal compressibility, respiratory phasicity and response to augmentation. Calf Veins: No evidence of thrombus. Normal compressibility and flow on color Doppler imaging. Superficial Great Saphenous Vein: No evidence of thrombus. Normal compressibility and flow on color Doppler imaging. Venous Reflux:  None. Other  Findings:  Examination mildly limited by body habitus. IMPRESSION: No evidence of DVT within either lower extremity. Electronically Signed   By: Jeannine Boga M.D.   On: 09/15/2016 21:23

## 2016-09-15 NOTE — ED Notes (Signed)
Attempted to draw lavender tube unsuccessful. Sam, RN notified at this time.

## 2016-09-15 NOTE — ED Notes (Signed)
Pt back from US

## 2016-09-15 NOTE — ED Notes (Signed)
  Esign not working. Pt verbalized discharge instructions and has no questions at this time.

## 2016-09-15 NOTE — ED Triage Notes (Signed)
Pt with bilateral leg weeping and swelling going on for some time. Ulcers noted to left lower posterior leg.

## 2016-09-15 NOTE — ED Notes (Signed)
Patient transported to Ultrasound 

## 2016-11-24 ENCOUNTER — Ambulatory Visit (INDEPENDENT_AMBULATORY_CARE_PROVIDER_SITE_OTHER): Payer: BLUE CROSS/BLUE SHIELD | Admitting: Vascular Surgery

## 2016-11-24 ENCOUNTER — Encounter (INDEPENDENT_AMBULATORY_CARE_PROVIDER_SITE_OTHER): Payer: Self-pay | Admitting: Vascular Surgery

## 2016-11-24 VITALS — BP 144/81 | HR 81 | Resp 17 | Ht 70.0 in | Wt 341.0 lb

## 2016-11-24 DIAGNOSIS — M7989 Other specified soft tissue disorders: Secondary | ICD-10-CM | POA: Diagnosis not present

## 2016-11-24 DIAGNOSIS — I10 Essential (primary) hypertension: Secondary | ICD-10-CM | POA: Diagnosis not present

## 2016-11-24 NOTE — Patient Instructions (Signed)

## 2016-11-24 NOTE — Assessment & Plan Note (Signed)
blood pressure control important in reducing the progression of atherosclerotic disease. On appropriate oral medications.  

## 2016-11-24 NOTE — Assessment & Plan Note (Signed)
I have had a long discussion with the patient regarding swelling and why it  causes symptoms.  Patient will begin wearing graduated compression stockings class 1 (20-30 mmHg) on a daily basis a prescription was given. The patient will  beginning wearing the stockings first thing in the morning and removing them in the evening. The patient is instructed specifically not to sleep in the stockings.   In addition, behavioral modification will be initiated.  This will include frequent elevation, use of over the counter pain medications and exercise such as walking.  I have reviewed systemic causes for chronic edema such as liver, kidney and cardiac etiologies.  The patient denies problems with these organ systems.    Consideration for a lymph pump will also be made based upon the effectiveness of conservative therapy.  This would help to improve the edema control and prevent sequela such as ulcers and infections   Patient should undergo duplex ultrasound of the venous system to ensure that DVT or reflux is not present.  The patient will follow-up with me after the ultrasound.

## 2016-11-24 NOTE — Progress Notes (Signed)
Patient ID: Ernest Sims, male   DOB: 1955/08/17, 61 y.o.   MRN: [id number]  Chief Complaint  Patient presents with  . New Patient (Initial Visit)    edema    HPI Ernest Sims is a 61 y.o. male.  I am asked to see the patient by Dr. Doy Hutching for evaluation of lower extremity edema.   Patient is seen for evaluation of leg swelling. The patient first noticed the swelling a year or so ago but is now concerned because of a significant increase in the overall edema. The right leg got better with diuretics but the left leg did not. The swelling is associated with pain and discoloration. The patient notes that in the morning the legs are significantly improved but they steadily worsened throughout the course of the day. Elevation makes the legs better, dependency makes them much worse.   There is no history of ulcerations associated with the swelling.   The patient denies any recent changes in their medications.  The patient has not been wearing graduated compression.  The patient has no had any past angiography, interventions or vascular surgery.  The patient denies a history of DVT or PE. There is no prior history of phlebitis. There is no history of primary lymphedema.  There is no history of radiation treatment to the groin or pelvis No history of malignancies. No history of trauma or groin or pelvic surgery. No history of foreign travel or parasitic infections area     Past Medical History:  Diagnosis Date  . Anxiety   . Arthritis   . Colitis   . COPD (chronic obstructive pulmonary disease) (HCC)    2.5 L O2 AT NIGHT  . Crohn disease ([place])   . Diverticulosis   . Dyspnea    OFTEN  . GERD (gastroesophageal reflux disease)   . Hypercholesteremia   . Hypertension   . Migraines   . Motion sickness   . Orthopnea    sleeps in chair  . Restless leg syndrome   . Seasonal allergies     Past Surgical History:  Procedure Laterality Date  . CATARACT EXTRACTION W/PHACO  Right 07/22/2016   Procedure: CATARACT EXTRACTION PHACO AND INTRAOCULAR LENS PLACEMENT ([place]) RIGHT;  Surgeon: Leandrew Koyanagi, MD;  Location: [place];  Service: Ophthalmology;  Laterality: Right;  IVA TOPICAL RIGHT TORIC  . CATARACT EXTRACTION W/PHACO Left 08/05/2016   Procedure: CATARACT EXTRACTION PHACO AND INTRAOCULAR LENS PLACEMENT ([place])  left toric lens;  Surgeon: Leandrew Koyanagi, MD;  Location: [place];  Service: Ophthalmology;  Laterality: Left;  Toric lens  . COLONOSCOPY WITH PROPOFOL N/A 02/28/2015   Procedure: COLONOSCOPY WITH PROPOFOL;  Surgeon: Manya Silvas, MD;  [facility] ENDOSCOPY;  Service: Endoscopy;  Laterality: N/A;  . ESOPHAGOGASTRODUODENOSCOPY (EGD) WITH PROPOFOL N/A 02/28/2015   Procedure: ESOPHAGOGASTRODUODENOSCOPY (EGD) WITH PROPOFOL;  Surgeon: Manya Silvas, MD;  Location: [facility] ENDOSCOPY;  Service: Endoscopy;  Laterality: N/A;  . JOINT REPLACEMENT  2002   hip  . PILONIDAL CYST EXCISION  1971  . REVISION TOTAL HIP ARTHROPLASTY  2003  . ROTATOR CUFF REPAIR  2016  . ruptured disc  1984    Family History  Problem Relation Age of Onset  . Lung disease Father   . Heart disease Father   . Lung disease Mother   . Cancer Brother        liver  . Cancer Maternal Aunt     Social History Social History  Substance Use  Topics  . Smoking status: Former Smoker    Packs/day: 3.00    Years: 24.00    Types: Cigarettes    Quit date: 03/08/1988  . Smokeless tobacco: Never Used  . Alcohol use 0.0 oz/week     Comment: occasional  No IVDU  Allergies  Allergen Reactions  . Etodolac Nausea And Vomiting  . Penicillins Other (See Comments)    Reaction:  Unknown   . Tape Rash    Paper tape caues rash    Current Outpatient Prescriptions  Medication Sig Dispense Refill  . allopurinol (ZYLOPRIM) 300 MG tablet Take 300 mg by mouth as needed.     Marland Kitchen amitriptyline (ELAVIL) 25 MG tablet Take 25 mg by mouth at bedtime.    Marland Kitchen  amLODipine-atorvastatin (CADUET) 5-40 MG tablet Take 1 tablet by mouth daily.    Marland Kitchen aspirin EC 81 MG EC tablet Take 1 tablet (81 mg total) by mouth daily.    . budesonide (ENTOCORT EC) 3 MG 24 hr capsule Take by mouth daily.    . diazepam (VALIUM) 10 MG tablet Take 1 tablet (10 mg total) by mouth every 8 (eight) hours as needed for anxiety. 30 tablet 0  . furosemide (LASIX) 40 MG tablet Take 40 mg by mouth daily.    . hydrocortisone valerate ointment (WESTCORT) 0.2 % Apply to affected area daily 45 g 1  . latanoprost (XALATAN) 0.005 % ophthalmic solution Place 1 drop into both eyes at bedtime.    Marland Kitchen olmesartan (BENICAR) 20 MG tablet Take 20 mg by mouth daily.    Marland Kitchen omeprazole (PRILOSEC) 40 MG capsule Take 40 mg by mouth 2 (two) times daily.     Marland Kitchen oxyCODONE (OXY IR/ROXICODONE) 5 MG immediate release tablet Take 2 tablets (10 mg total) by mouth every 4 (four) hours as needed for moderate pain or severe pain. 30 tablet 0  . oxyCODONE-acetaminophen (ROXICET) 5-325 MG tablet Take 1 tablet by mouth every 6 (six) hours as needed for severe pain. 8 tablet 0  . OXYGEN Inhale 2.5 L into the lungs as needed.    . pramipexole (MIRAPEX) 0.5 MG tablet Take 0.5 mg by mouth 2 (two) times daily.    . pravastatin (PRAVACHOL) 40 MG tablet Take 40 mg by mouth at bedtime.     . propranolol (INDERAL) 40 MG tablet Take 40 mg by mouth 2 (two) times daily.    . timolol (TIMOPTIC) 0.5 % ophthalmic solution Place 1 drop into both eyes 2 (two) times daily.    Marland Kitchen topiramate (TOPAMAX) 100 MG tablet Take 100 mg by mouth 2 (two) times daily.    Marland Kitchen triamcinolone (NASACORT) 55 MCG/ACT AERO nasal inhaler Place 2 sprays into the nose 2 (two) times daily as needed.     . zolpidem (AMBIEN) 10 MG tablet Take 1 tablet (10 mg total) by mouth at bedtime as needed for sleep. 10 tablet 0   No current facility-administered medications for this visit.       REVIEW OF SYSTEMS (Negative unless checked)  Constitutional: _0 Weight loss  _1 Fever   _2 Chills Cardiac: _3 Chest pain   _4 Chest pressure   _5 Palpitations   _6 Shortness of breath when laying flat   _7 Shortness of breath at rest   _8 Shortness of breath with exertion. Vascular:  _9 Pain in legs with walking   _10 Pain in legs at rest   _11 Pain in legs when laying flat   _12 Claudication   _13 Pain in feet when walking  _14 Pain in feet at rest  _15 Pain in  feet when laying flat   _0 History of DVT   _1 Phlebitis   _2 Swelling in legs   _3 Varicose veins   _4 Non-healing ulcers Pulmonary:   _5 Uses home oxygen   _6 Productive cough   _7 Hemoptysis   _8 Wheeze  _9 COPD   _10 Asthma Neurologic:  _11 Dizziness  _12 Blackouts   _13 Seizures   _14 History of stroke   _15 History of TIA  _16 Aphasia   _17 Temporary blindness   _18 Dysphagia   _19 Weakness or numbness in arms   _20 Weakness or numbness in legs Musculoskeletal:  _21 Arthritis   _22 Joint swelling   _23 Joint pain   _24 Low back pain Hematologic:  _25 Easy bruising  _26 Easy bleeding   _27 Hypercoagulable state   _28 Anemic  _29 Hepatitis Gastrointestinal:  _30 Blood in stool   _31 Vomiting blood  _32 Gastroesophageal reflux/heartburn   _33 Abdominal pain Genitourinary:  _34 Chronic kidney disease   _35 Difficult urination  _36 Frequent urination  _37 Burning with urination   _38 Hematuria Skin:  _39 Rashes   _40 Ulcers   _41 Wounds Psychological:  _42 History of anxiety   _43  History of major depression.    Physical Exam BP (!) 144/81 (BP Location: Right Wrist)   Pulse 81   Resp 17   Ht _44  (1.778 m)   Wt (!) 341 lb (154.7 kg)   BMI 48.93 kg/m  Gen:  WD/WN, NAD. obese Head: [place]/AT, No temporalis wasting. Ear/Nose/Throat: Hearing grossly intact, nares w/o erythema or drainage, oropharynx w/o Erythema/Exudate Eyes: Conjunctiva clear, sclera non-icteric  Neck: trachea midline.  No JVD.  Pulmonary:  Good air movement, respirations not labored, no use of accessory muscles Cardiac: RRR, no JVD Vascular:  Vessel Right Left  Radial Palpable Palpable                          PT 1+ Palpable Trace  Palpable  DP Palpable Palpable    Musculoskeletal: M/S 5/5 throughout.  Extremities without ischemic changes.  No deformity or atrophy. 1+ RLE edema, 2+ LLE edema. Neurologic: Sensation grossly intact in extremities.  Symmetrical.  Speech is fluent. Motor exam as listed above. Psychiatric: Judgment intact, Mood & affect appropriate for pt's clinical situation. Dermatologic: No rashes or ulcers noted.  No cellulitis or open wounds.  Radiology No results found.  Labs Recent Results (from the past 2160 hour(s))  Comprehensive metabolic panel     Status: Abnormal   Collection Time: 09/15/16  6:15 PM  Result Value Ref Range   Sodium 140 135 - 145 mmol/L   Potassium 4.9 3.5 - 5.1 mmol/L   Chloride 110 101 - 111 mmol/L   CO2 20 (L) 22 - 32 mmol/L   Glucose, Bld 115 (H) 65 - 99 mg/dL   BUN 41 (H) 6 - 20 mg/dL   Creatinine, Ser 1.35 (H) 0.61 - 1.24 mg/dL   Calcium 9.1 8.9 - 10.3 mg/dL   Total Protein 7.9 6.5 - 8.1 g/dL   Albumin 4.2 3.5 - 5.0 g/dL   AST 20 15 - 41 U/L   ALT 15 (L) 17 - 63 U/L   Alkaline Phosphatase 102 38 - 126 U/L   Total Bilirubin 0.5 0.3 - 1.2 mg/dL   GFR calc non Af Amer 54 (L) >60 mL/min   GFR calc Af Amer >60 >60 mL/min    Comment: (NOTE) The eGFR has been calculated using the CKD EPI equation. This calculation has not been validated in all clinical situations. eGFR's persistently <60 mL/min signify possible Chronic Kidney Disease.    Anion gap 10 5 - 15  CBC with  Differential/Platelet     Status: Abnormal   Collection Time: 09/15/16  8:07 PM  Result Value Ref Range   WBC 8.2 3.8 - 10.6 K/uL   RBC 4.97 4.40 - 5.90 MIL/uL   Hemoglobin 13.8 13.0 - 18.0 g/dL   HCT 42.8 40.0 - 52.0 %   MCV 86.0 80.0 - 100.0 fL   MCH 27.7 26.0 - 34.0 pg   MCHC 32.2 32.0 - 36.0 g/dL   RDW 16.0 (H) 11.5 - 14.5 %   Platelets 220 150 - 440 K/uL   Neutrophils Relative % 73 %   Neutro Abs 6.0 1.4 - 6.5 K/uL   Lymphocytes Relative 13 %   Lymphs Abs 1.1 1.0 - 3.6 K/uL    Monocytes Relative 9 %   Monocytes Absolute 0.7 0.2 - 1.0 K/uL   Eosinophils Relative 4 %   Eosinophils Absolute 0.3 0 - 0.7 K/uL   Basophils Relative 1 %   Basophils Absolute 0.1 0 - 0.1 K/uL    Assessment/Plan:  Hypertension blood pressure control important in reducing the progression of atherosclerotic disease. On appropriate oral medications.   Morbid obesity Worsens LE swelling  Swelling of limb I have had a long discussion with the patient regarding swelling and why it  causes symptoms.  Patient will begin wearing graduated compression stockings class 1 (20-30 mmHg) on a daily basis a prescription was given. The patient will  beginning wearing the stockings first thing in the morning and removing them in the evening. The patient is instructed specifically not to sleep in the stockings.   In addition, behavioral modification will be initiated.  This will include frequent elevation, use of over the counter pain medications and exercise such as walking.  I have reviewed systemic causes for chronic edema such as liver, kidney and cardiac etiologies.  The patient denies problems with these organ systems.    Consideration for a lymph pump will also be made based upon the effectiveness of conservative therapy.  This would help to improve the edema control and prevent sequela such as ulcers and infections   Patient should undergo duplex ultrasound of the venous system to ensure that DVT or reflux is not present.  The patient will follow-up with me after the ultrasound.        Leotis Pain 11/24/2016, 2:03 PM   This note was created with Dragon medical transcription system.  Any errors from dictation are unintentional.

## 2016-11-24 NOTE — Assessment & Plan Note (Signed)
Worsens LE swelling

## 2016-12-29 ENCOUNTER — Encounter (INDEPENDENT_AMBULATORY_CARE_PROVIDER_SITE_OTHER): Payer: Self-pay | Admitting: Vascular Surgery

## 2016-12-29 ENCOUNTER — Ambulatory Visit (INDEPENDENT_AMBULATORY_CARE_PROVIDER_SITE_OTHER): Payer: BLUE CROSS/BLUE SHIELD

## 2016-12-29 ENCOUNTER — Ambulatory Visit (INDEPENDENT_AMBULATORY_CARE_PROVIDER_SITE_OTHER): Payer: BLUE CROSS/BLUE SHIELD | Admitting: Vascular Surgery

## 2016-12-29 VITALS — BP 133/59 | HR 63 | Resp 20 | Ht 69.0 in | Wt 345.0 lb

## 2016-12-29 DIAGNOSIS — M7989 Other specified soft tissue disorders: Secondary | ICD-10-CM | POA: Diagnosis not present

## 2016-12-29 DIAGNOSIS — L97209 Non-pressure chronic ulcer of unspecified calf with unspecified severity: Secondary | ICD-10-CM | POA: Insufficient documentation

## 2016-12-29 DIAGNOSIS — I10 Essential (primary) hypertension: Secondary | ICD-10-CM | POA: Diagnosis not present

## 2016-12-29 DIAGNOSIS — L97201 Non-pressure chronic ulcer of unspecified calf limited to breakdown of skin: Secondary | ICD-10-CM | POA: Diagnosis not present

## 2016-12-29 DIAGNOSIS — I872 Venous insufficiency (chronic) (peripheral): Secondary | ICD-10-CM | POA: Diagnosis not present

## 2016-12-29 NOTE — Patient Instructions (Signed)

## 2016-12-29 NOTE — Progress Notes (Signed)
MRN : [id number]  Ernest Sims is a 61 y.o. ([birth year]) male who presents with chief complaint of  Chief Complaint  Patient presents with  . Follow-up    Bilateral Venous reflux  .  History of Present Illness: Patient returns in follow up for leg swelling and ulcerations.  He is having more H as well as skin breakdown on the left leg.  He has a couple of small superficial ulcerations on the right.  This remains painful.  He has no fever or chills.  His venous duplex shows bilateral deep venous reflux as well as right great saphenous vein reflux.  A moderate-sized left Baker's cyst was also seen.  His left leg swelling is more prominent than the right, but both legs are swollen.         Past Medical History:  Diagnosis Date  . Anxiety   . Arthritis   . Colitis   . COPD (chronic obstructive pulmonary disease) (HCC)    2.5 L O2 AT NIGHT  . Crohn disease ([place])   . Diverticulosis   . Dyspnea    OFTEN  . GERD (gastroesophageal reflux disease)   . Hypercholesteremia   . Hypertension   . Migraines   . Motion sickness   . Orthopnea    sleeps in chair  . Restless leg syndrome   . Seasonal allergies          Past Surgical History:  Procedure Laterality Date  . CATARACT EXTRACTION W/PHACO Right 07/22/2016   Procedure: CATARACT EXTRACTION PHACO AND INTRAOCULAR LENS PLACEMENT ([place]) RIGHT;  Surgeon: Leandrew Koyanagi, MD;  Location: [place];  Service: Ophthalmology;  Laterality: Right;  IVA TOPICAL RIGHT TORIC  . CATARACT EXTRACTION W/PHACO Left 08/05/2016   Procedure: CATARACT EXTRACTION PHACO AND INTRAOCULAR LENS PLACEMENT ([place])  left toric lens;  Surgeon: Leandrew Koyanagi, MD;  Location: [place];  Service: Ophthalmology;  Laterality: Left;  Toric lens  . COLONOSCOPY WITH PROPOFOL N/A 02/28/2015   Procedure: COLONOSCOPY WITH PROPOFOL;  Surgeon: Manya Silvas, MD;  Location: [facility] ENDOSCOPY;  Service: Endoscopy;   Laterality: N/A;  . ESOPHAGOGASTRODUODENOSCOPY (EGD) WITH PROPOFOL N/A 02/28/2015   Procedure: ESOPHAGOGASTRODUODENOSCOPY (EGD) WITH PROPOFOL;  Surgeon: Manya Silvas, MD;  Location: [facility] ENDOSCOPY;  Service: Endoscopy;  Laterality: N/A;  . JOINT REPLACEMENT  2002   hip  . PILONIDAL CYST EXCISION  1971  . REVISION TOTAL HIP ARTHROPLASTY  2003  . ROTATOR CUFF REPAIR  2016  . ruptured disc  1984         Family History  Problem Relation Age of Onset  . Lung disease Father   . Heart disease Father   . Lung disease Mother   . Cancer Brother        liver  . Cancer Maternal Aunt     Social History        Social History  Substance Use Topics  . Smoking status: Former Smoker    Packs/day: 3.00    Years: 24.00    Types: Cigarettes    Quit date: 03/08/1988  . Smokeless tobacco: Never Used  . Alcohol use 0.0 oz/week      Comment: occasional  No IVDU       Allergies  Allergen Reactions  . Etodolac Nausea And Vomiting  . Penicillins Other (See Comments)    Reaction:  Unknown   . Tape Rash    Paper tape caues rash          Current  Outpatient Prescriptions  Medication Sig Dispense Refill  . allopurinol (ZYLOPRIM) 300 MG tablet Take 300 mg by mouth as needed.     Marland Kitchen amitriptyline (ELAVIL) 25 MG tablet Take 25 mg by mouth at bedtime.    Marland Kitchen amLODipine-atorvastatin (CADUET) 5-40 MG tablet Take 1 tablet by mouth daily.    Marland Kitchen aspirin EC 81 MG EC tablet Take 1 tablet (81 mg total) by mouth daily.    . budesonide (ENTOCORT EC) 3 MG 24 hr capsule Take by mouth daily.    . diazepam (VALIUM) 10 MG tablet Take 1 tablet (10 mg total) by mouth every 8 (eight) hours as needed for anxiety. 30 tablet 0  . furosemide (LASIX) 40 MG tablet Take 40 mg by mouth daily.    . hydrocortisone valerate ointment (WESTCORT) 0.2 % Apply to affected area daily 45 g 1  . latanoprost (XALATAN) 0.005 % ophthalmic solution Place 1 drop into both eyes at bedtime.      Marland Kitchen olmesartan (BENICAR) 20 MG tablet Take 20 mg by mouth daily.    Marland Kitchen omeprazole (PRILOSEC) 40 MG capsule Take 40 mg by mouth 2 (two) times daily.     Marland Kitchen oxyCODONE (OXY IR/ROXICODONE) 5 MG immediate release tablet Take 2 tablets (10 mg total) by mouth every 4 (four) hours as needed for moderate pain or severe pain. 30 tablet 0  . oxyCODONE-acetaminophen (ROXICET) 5-325 MG tablet Take 1 tablet by mouth every 6 (six) hours as needed for severe pain. 8 tablet 0  . OXYGEN Inhale 2.5 L into the lungs as needed.    . pramipexole (MIRAPEX) 0.5 MG tablet Take 0.5 mg by mouth 2 (two) times daily.    . pravastatin (PRAVACHOL) 40 MG tablet Take 40 mg by mouth at bedtime.     . propranolol (INDERAL) 40 MG tablet Take 40 mg by mouth 2 (two) times daily.    . timolol (TIMOPTIC) 0.5 % ophthalmic solution Place 1 drop into both eyes 2 (two) times daily.    Marland Kitchen topiramate (TOPAMAX) 100 MG tablet Take 100 mg by mouth 2 (two) times daily.    Marland Kitchen triamcinolone (NASACORT) 55 MCG/ACT AERO nasal inhaler Place 2 sprays into the nose 2 (two) times daily as needed.     . zolpidem (AMBIEN) 10 MG tablet Take 1 tablet (10 mg total) by mouth at bedtime as needed for sleep. 10 tablet 0   No current facility-administered medications for this visit.       REVIEW OF SYSTEMS (Negative unless checked)  Constitutional: [] Weight loss  [] Fever  [] Chills Cardiac: [] Chest pain   [] Chest pressure   [] Palpitations   [] Shortness of breath when laying flat   [] Shortness of breath at rest   [] Shortness of breath with exertion. Vascular:  [] Pain in legs with walking   [] Pain in legs at rest   [] Pain in legs when laying flat   [] Claudication   [] Pain in feet when walking  [] Pain in feet at rest  [] Pain in feet when laying flat   [] History of DVT   [] Phlebitis   [x] Swelling in legs   [] Varicose veins   [] Non-healing ulcers Pulmonary:   [x] Uses home oxygen   [] Productive cough   [] Hemoptysis   [] Wheeze  [x] COPD    [] Asthma Neurologic:  [] Dizziness  [] Blackouts   [] Seizures   [] History of stroke   [] History of TIA  [] Aphasia   [] Temporary blindness   [] Dysphagia   [] Weakness or numbness in arms   [] Weakness or numbness in legs  Musculoskeletal:  [x] Arthritis   [] Joint swelling   [] Joint pain   [x] Low back pain Hematologic:  [] Easy bruising  [] Easy bleeding   [] Hypercoagulable state   [] Anemic  [] Hepatitis Gastrointestinal:  [] Blood in stool   [] Vomiting blood  [x] Gastroesophageal reflux/heartburn   [] Abdominal pain Genitourinary:  [x] Chronic kidney disease   [] Difficult urination  [] Frequent urination  [] Burning with urination   [] Hematuria Skin:  [] Rashes   [] Ulcers   [] Wounds Psychological:  [] History of anxiety   []  History of major depression.   Physical Examination  Vitals:   12/29/16 1144  BP: (!) 133/59  Pulse: 63  Resp: 20  Weight: (!) 156.5 kg (345 lb)  Height: 5' 9"  (1.753 m)   Body mass index is 50.95 kg/m. Gen:  WD/WN, NAD, appears older than stated age Head: [place]/AT, No temporalis wasting. Ear/Nose/Throat: Hearing grossly intact, dentition good, trachea midline Eyes: Conjunctiva clear. Sclera non-icteric Neck: Supple.  No JVD. Trachea midline Pulmonary:  Good air movement, respirations not labored, no use of accessory muscles.  Cardiac: RRR, normal S1, S2. Vascular:  Vessel Right Left  Radial Palpable Palpable                                   Gastrointestinal: soft, non-tender/non-distended. No guarding/reflex.  Musculoskeletal: M/S 5/5 throughout.  No deformity or atrophy. 1-2+ RLE edema, 2-3+ LLE edema.  A couple of small shallow ulcerations are present on the right leg with ulcerations on the medial ankle and lower leg area and some scattered ulcerations on the lateral lower leg area as well. Neurologic: Sensation grossly intact in extremities.  Symmetrical.  Speech is fluent. Psychiatric: Judgment intact, Mood & affect appropriate for pt's clinical  situation. Dermatologic: Ulcerations as described above     Labs No results found for this or any previous visit (from the past 2160 hour(s)).  Radiology No results found.   Assessment/Plan Hypertension blood pressure control important in reducing the progression of atherosclerotic disease. On appropriate oral medications.   Morbid obesity Worsens LE swelling  Lower limb ulcer, calf (HCC) Three layer UNNA boots placed today.  Change weekly.  Recheck in 3-4 weeks.  Chronic venous insufficiency His venous duplex shows bilateral deep venous reflux as well as right great saphenous vein reflux.  A moderate-sized left Baker's cyst was also seen.  We are going to wrap him in Unna boots today to try to get these ulcers healed.  This should help get the swelling under better control as well.  No plan for intervention at this time, but could certainly consider right great saphenous vein ablation in the future if recurrent ulceration and swelling continue to be present.  Recheck in a month or so.    Leotis Pain, MD  12/29/2016 1:53 PM    This note was created with Dragon medical transcription system.  Any errors from dictation are purely unintentional

## 2016-12-29 NOTE — Assessment & Plan Note (Signed)
Three layer UNNA boots placed today.  Change weekly.  Recheck in 3-4 weeks.

## 2016-12-29 NOTE — Assessment & Plan Note (Signed)
His venous duplex shows bilateral deep venous reflux as well as right great saphenous vein reflux.  A moderate-sized left Baker's cyst was also seen.  We are going to wrap him in Unna boots today to try to get these ulcers healed.  This should help get the swelling under better control as well.  No plan for intervention at this time, but could certainly consider right great saphenous vein ablation in the future if recurrent ulceration and swelling continue to be present.  Recheck in a month or so.

## 2017-01-05 ENCOUNTER — Ambulatory Visit (INDEPENDENT_AMBULATORY_CARE_PROVIDER_SITE_OTHER): Payer: BLUE CROSS/BLUE SHIELD | Admitting: Vascular Surgery

## 2017-01-05 ENCOUNTER — Encounter (INDEPENDENT_AMBULATORY_CARE_PROVIDER_SITE_OTHER): Payer: Self-pay | Admitting: Vascular Surgery

## 2017-01-05 VITALS — BP 151/73 | HR 84 | Resp 19 | Ht 70.0 in | Wt 346.0 lb

## 2017-01-05 DIAGNOSIS — M7989 Other specified soft tissue disorders: Secondary | ICD-10-CM | POA: Diagnosis not present

## 2017-01-12 ENCOUNTER — Encounter (INDEPENDENT_AMBULATORY_CARE_PROVIDER_SITE_OTHER): Payer: Self-pay

## 2017-01-12 ENCOUNTER — Ambulatory Visit (INDEPENDENT_AMBULATORY_CARE_PROVIDER_SITE_OTHER): Payer: BLUE CROSS/BLUE SHIELD | Admitting: Vascular Surgery

## 2017-01-12 VITALS — BP 141/74 | HR 81 | Resp 19 | Ht 70.0 in | Wt 347.0 lb

## 2017-01-12 DIAGNOSIS — L97201 Non-pressure chronic ulcer of unspecified calf limited to breakdown of skin: Secondary | ICD-10-CM

## 2017-01-12 NOTE — Progress Notes (Signed)
History of Present Illness  There is no documented history at this time  Assessments & Plan   There are no diagnoses linked to this encounter.    Additional instructions  Subjective:  Patient presents with venous ulcer of the Bilateral lower extremity.    Procedure:  3 layer unna wrap was placed Bilateral lower extremity.   Plan:   Follow up in one week.  

## 2017-01-19 ENCOUNTER — Encounter (INDEPENDENT_AMBULATORY_CARE_PROVIDER_SITE_OTHER): Payer: Self-pay

## 2017-01-19 ENCOUNTER — Ambulatory Visit (INDEPENDENT_AMBULATORY_CARE_PROVIDER_SITE_OTHER): Payer: BLUE CROSS/BLUE SHIELD | Admitting: Vascular Surgery

## 2017-01-19 VITALS — BP 148/81 | HR 86 | Resp 16 | Wt 351.0 lb

## 2017-01-19 DIAGNOSIS — I872 Venous insufficiency (chronic) (peripheral): Secondary | ICD-10-CM

## 2017-01-19 NOTE — Progress Notes (Signed)
History of Present Illness  There is no documented history at this time  Assessments & Plan   There are no diagnoses linked to this encounter.    Additional instructions  Subjective:  Patient presents with venous ulcer of the Bilateral lower extremity.    Procedure:  3 layer unna wrap was placed Bilateral lower extremity.   Plan:   Follow up in one week.  

## 2017-01-26 ENCOUNTER — Ambulatory Visit (INDEPENDENT_AMBULATORY_CARE_PROVIDER_SITE_OTHER): Payer: BLUE CROSS/BLUE SHIELD | Admitting: Vascular Surgery

## 2017-01-26 ENCOUNTER — Encounter (INDEPENDENT_AMBULATORY_CARE_PROVIDER_SITE_OTHER): Payer: Self-pay | Admitting: Vascular Surgery

## 2017-01-26 VITALS — BP 132/72 | HR 76 | Resp 18 | Wt 352.0 lb

## 2017-01-26 DIAGNOSIS — I10 Essential (primary) hypertension: Secondary | ICD-10-CM | POA: Diagnosis not present

## 2017-01-26 DIAGNOSIS — L97201 Non-pressure chronic ulcer of unspecified calf limited to breakdown of skin: Secondary | ICD-10-CM | POA: Diagnosis not present

## 2017-01-26 DIAGNOSIS — M7989 Other specified soft tissue disorders: Secondary | ICD-10-CM | POA: Diagnosis not present

## 2017-01-26 DIAGNOSIS — I872 Venous insufficiency (chronic) (peripheral): Secondary | ICD-10-CM | POA: Diagnosis not present

## 2017-01-26 NOTE — Progress Notes (Signed)
MRN : [id number]  Ernest Sims is a 61 y.o. (1955-10-06) male who presents with chief complaint of  Chief Complaint  Patient presents with  . Follow-up    unna check  .  History of Present Illness: Patient returns today in follow up of leg swelling.  His legs look much better today.  The right leg is basically normal with no leg swelling.  The left leg still has some swelling but the weeping and ulceration has healed and the swelling is much better.  His legs feel much better.  Past Medical History:  Diagnosis Date  . Anxiety   . Arthritis   . Colitis   . COPD (chronic obstructive pulmonary disease) (HCC)    2.5 L O2 AT NIGHT  . Crohn disease ([place])   . Diverticulosis   . Dyspnea    OFTEN  . GERD (gastroesophageal reflux disease)   . Hypercholesteremia   . Hypertension   . Migraines   . Motion sickness   . Orthopnea    sleeps in chair  . Restless leg syndrome   . Seasonal allergies          Past Surgical History:  Procedure Laterality Date  . CATARACT EXTRACTION W/PHACO Right 07/22/2016   Procedure: CATARACT EXTRACTION PHACO AND INTRAOCULAR LENS PLACEMENT ([place]) RIGHT; Surgeon: Leandrew Koyanagi, MD; Location: [place]; Service: Ophthalmology; Laterality: Right; IVA TOPICAL RIGHT TORIC  . CATARACT EXTRACTION W/PHACO Left 08/05/2016   Procedure: CATARACT EXTRACTION PHACO AND INTRAOCULAR LENS PLACEMENT ([place]) left toric lens; Surgeon: Leandrew Koyanagi, MD; Location: [place]; Service: Ophthalmology; Laterality: Left; Toric lens  . COLONOSCOPY WITH PROPOFOL N/A 02/28/2015   Procedure: COLONOSCOPY WITH PROPOFOL; Surgeon: Manya Silvas, MD; Location: [facility] END[facility]COPY; Service: Endoscopy; Laterality: N/A;  . ESOPHAGOGASTRODUODENOSCOPY (EGD) WITH PROPOFOL N/A 02/28/2015   Procedure: ESOPHAGOGASTRODUODENOSCOPY (EGD) WITH PROPOFOL; Surgeon: Manya Silvas, MD; Location: [facility] ENDOSCOPY; Service:  Endoscopy; Laterality: N/A;  . JOINT REPLACEMENT  2002   hip  . PILONIDAL CYST EXCISION  1971  . REVISION TOTAL HIP ARTHROPLASTY  2003  . ROTATOR CUFF REPAIR  2016  . ruptured disc  1984         Family History  Problem Relation Age of Onset  . Lung disease Father   . Heart disease Father   . Lung disease Mother   . Cancer Brother    liver  . Cancer Maternal Aunt     Social History        Social History  Substance Use Topics  . Smoking status: Former Smoker    Packs/day: 3.00    Years: 24.00    Types: Cigarettes    Quit date: 03/08/1988  . Smokeless tobacco: Never Used  . Alcohol use 0.0 oz/week      Comment: occasional  No IVDU       Allergies  Allergen Reactions  . Etodolac Nausea And Vomiting  . Penicillins Other (See Comments)    Reaction: Unknown   . Tape Rash    Paper tape caues rash          Current Outpatient Prescriptions  Medication Sig Dispense Refill  . allopurinol (ZYLOPRIM) 300 MG tablet Take 300 mg by mouth as needed.     Marland Kitchen amitriptyline (ELAVIL) 25 MG tablet Take 25 mg by mouth at bedtime.    Marland Kitchen amLODipine-atorvastatin (CADUET) 5-40 MG tablet Take 1 tablet by mouth daily.    Marland Kitchen aspirin EC 81 MG EC tablet Take 1 tablet (81 mg  total) by mouth daily.    . budesonide (ENTOCORT EC) 3 MG 24 hr capsule Take by mouth daily.    . diazepam (VALIUM) 10 MG tablet Take 1 tablet (10 mg total) by mouth every 8 (eight) hours as needed for anxiety. 30 tablet 0  . furosemide (LASIX) 40 MG tablet Take 40 mg by mouth daily.    . hydrocortisone valerate ointment (WESTCORT) 0.2 % Apply to affected area daily 45 g 1  . latanoprost (XALATAN) 0.005 % ophthalmic solution Place 1 drop into both eyes at bedtime.    Marland Kitchen olmesartan (BENICAR) 20 MG tablet Take 20 mg by mouth daily.    Marland Kitchen omeprazole (PRILOSEC) 40 MG capsule Take 40 mg by mouth 2 (two) times daily.     Marland Kitchen oxyCODONE (OXY IR/ROXICODONE) 5 MG  immediate release tablet Take 2 tablets (10 mg total) by mouth every 4 (four) hours as needed for moderate pain or severe pain. 30 tablet 0  . oxyCODONE-acetaminophen (ROXICET) 5-325 MG tablet Take 1 tablet by mouth every 6 (six) hours as needed for severe pain. 8 tablet 0  . OXYGEN Inhale 2.5 L into the lungs as needed.    . pramipexole (MIRAPEX) 0.5 MG tablet Take 0.5 mg by mouth 2 (two) times daily.    . pravastatin (PRAVACHOL) 40 MG tablet Take 40 mg by mouth at bedtime.     . propranolol (INDERAL) 40 MG tablet Take 40 mg by mouth 2 (two) times daily.    . timolol (TIMOPTIC) 0.5 % ophthalmic solution Place 1 drop into both eyes 2 (two) times daily.    Marland Kitchen topiramate (TOPAMAX) 100 MG tablet Take 100 mg by mouth 2 (two) times daily.    Marland Kitchen triamcinolone (NASACORT) 55 MCG/ACT AERO nasal inhaler Place 2 sprays into the nose 2 (two) times daily as needed.     . zolpidem (AMBIEN) 10 MG tablet Take 1 tablet (10 mg total) by mouth at bedtime as needed for sleep. 10 tablet 0   No current facility-administered medications for this visit.       REVIEW OF SYSTEMS(Negative unless checked)  Constitutional: [] Weight loss[] Fever[] Chills Cardiac:[] Chest pain[] Chest pressure[] Palpitations [] Shortness of breath when laying flat [] Shortness of breath at rest [] Shortness of breath with exertion. Vascular: [] Pain in legs with walking[] Pain in legsat rest[] Pain in legs when laying flat [] Claudication [] Pain in feet when walking [] Pain in feet at rest [] Pain in feet when laying flat [] History of DVT [] Phlebitis [x] Swelling in legs [] Varicose veins [] Non-healing ulcers Pulmonary: [x] Uses home oxygen [] Productive cough[] Hemoptysis [] Wheeze [x] COPD [] Asthma Neurologic: [] Dizziness [] Blackouts [] Seizures [] History of stroke [] History of TIA[] Aphasia [] Temporary blindness[] Dysphagia [] Weaknessor numbness in arms [] Weakness or  numbnessin legs Musculoskeletal: [x] Arthritis [] Joint swelling [] Joint pain [x] Low back pain Hematologic:[] Easy bruising[] Easy bleeding [] Hypercoagulable state [] Anemic [] Hepatitis Gastrointestinal:[] Blood in stool[] Vomiting blood[x] Gastroesophageal reflux/heartburn[] Abdominal pain Genitourinary: [x] Chronic kidney disease [] Difficulturination [] Frequenturination [] Burning with urination[] Hematuria Skin: [] Rashes [] Ulcers [] Wounds Psychological: [] History of anxiety[] History of major depression.     Physical Examination  BP 132/72 (BP Location: Right Arm)   Pulse 76   Resp 18   Wt (!) 352 lb (159.7 kg)   BMI 50.51 kg/m  Gen:  WD/WN, NAD. Morbidly obese. Head: [place]/AT, No temporalis wasting. Ear/Nose/Throat: Hearing grossly intact, nares w/o erythema or drainage, trachea midline Eyes: Conjunctiva clear. Sclera non-icteric Neck: Supple.  No JVD.  Pulmonary:  Good air movement, no use of accessory muscles.  Cardiac: RRR, normal S1, S2 Vascular:  Vessel Right Left  Radial Palpable Palpable  PT 1+ Palpable Not Palpable  DP Palpable Palpable    Musculoskeletal: M/S 5/5 throughout.  No deformity or atrophy.  No significant right lower extremity 1-2+ left lower extremity edema. Neurologic: Sensation grossly intact in extremities.  Symmetrical.  Speech is fluent.  Psychiatric: Judgment intact, Mood & affect appropriate for pt's clinical situation. Dermatologic: No rashes or ulcers noted.  No cellulitis or open wounds.       Labs No results found for this or any previous visit (from the past 2160 hour(s)).  Radiology No results found.   Assessment/Plan Hypertension blood pressure control important in reducing the progression of atherosclerotic disease. On appropriate oral medications.   Morbid obesity Worsens LE swelling   Chronic venous insufficiency Mostly deep venous reflux.  No  intervention currently planned.  Continue compression stockings, leg elevation, and anti-inflammatories as needed for discomfort.  Lower limb ulcer, calf ([place]) Now healed  Swelling of limb Much improved.  We are going to come out of the Unna boots today and switch over to compression stockings and elevation.  Return to the office in 1-2 months for recheck.    Leotis Pain, MD  01/26/2017 12:59 PM    This note was created with Dragon medical transcription system.  Any errors from dictation are purely unintentional

## 2017-01-26 NOTE — Assessment & Plan Note (Signed)
Now healed

## 2017-01-26 NOTE — Assessment & Plan Note (Signed)
Mostly deep venous reflux.  No intervention currently planned.  Continue compression stockings, leg elevation, and anti-inflammatories as needed for discomfort.

## 2017-01-26 NOTE — Assessment & Plan Note (Signed)
Much improved.  We are going to come out of the Unna boots today and switch over to compression stockings and elevation.  Return to the office in 1-2 months for recheck.

## 2017-02-25 ENCOUNTER — Ambulatory Visit: Payer: BLUE CROSS/BLUE SHIELD | Attending: Specialist

## 2017-02-25 DIAGNOSIS — G4733 Obstructive sleep apnea (adult) (pediatric): Secondary | ICD-10-CM | POA: Diagnosis present

## 2017-03-12 ENCOUNTER — Ambulatory Visit: Payer: BLUE CROSS/BLUE SHIELD | Attending: Neurology

## 2017-03-12 DIAGNOSIS — G4733 Obstructive sleep apnea (adult) (pediatric): Secondary | ICD-10-CM | POA: Insufficient documentation

## 2017-03-23 ENCOUNTER — Encounter (INDEPENDENT_AMBULATORY_CARE_PROVIDER_SITE_OTHER): Payer: Self-pay | Admitting: Vascular Surgery

## 2017-03-23 ENCOUNTER — Ambulatory Visit (INDEPENDENT_AMBULATORY_CARE_PROVIDER_SITE_OTHER): Payer: BLUE CROSS/BLUE SHIELD | Admitting: Vascular Surgery

## 2017-03-23 VITALS — BP 188/90 | HR 105 | Resp 21 | Ht 70.0 in | Wt 361.0 lb

## 2017-03-23 DIAGNOSIS — I872 Venous insufficiency (chronic) (peripheral): Secondary | ICD-10-CM

## 2017-03-23 DIAGNOSIS — I10 Essential (primary) hypertension: Secondary | ICD-10-CM | POA: Diagnosis not present

## 2017-03-23 DIAGNOSIS — L97201 Non-pressure chronic ulcer of unspecified calf limited to breakdown of skin: Secondary | ICD-10-CM | POA: Diagnosis not present

## 2017-03-23 NOTE — Assessment & Plan Note (Signed)
He would like to try to stay out of the Unna boots so we will continue to give this a chance to heal.  His swelling is fairly well controlled.  I will plan to check him back in about 3 months.

## 2017-03-23 NOTE — Progress Notes (Signed)
MRN : [id number]  Ernest Sims is a 62 y.o. ([birth year]) male who presents with chief complaint of  Chief Complaint  Patient presents with  . Follow-up    6-8 week f/u  .  History of Present Illness: Patient returns today in follow up of leg swelling.  He has a small superficial ulceration on the right lower leg after a minor trauma there.  This has been slowly healing.  No fevers or chills.  His leg swelling is a little worse than at his last visit, but still reasonably well controlled with compression stockings and elevation   Past Medical History:  Diagnosis Date  . Anxiety   . Arthritis   . Colitis   . COPD (chronic obstructive pulmonary disease) (HCC)    2.5 L O2 AT NIGHT  . Crohn disease ([place])   . Diverticulosis   . Dyspnea    OFTEN  . GERD (gastroesophageal reflux disease)   . Hypercholesteremia   . Hypertension   . Migraines   . Motion sickness   . Orthopnea    sleeps in chair  . Restless leg syndrome   . Seasonal allergies          Past Surgical History:  Procedure Laterality Date  . CATARACT EXTRACTION W/PHACO Right 07/22/2016   Procedure: CATARACT EXTRACTION PHACO AND INTRAOCULAR LENS PLACEMENT ([place]) RIGHT; Surgeon: Leandrew Koyanagi, MD; Location: [place]; Service: Ophthalmology; Laterality: Right; IVA TOPICAL RIGHT TORIC  . CATARACT EXTRACTION W/PHACO Left 08/05/2016   Procedure: CATARACT EXTRACTION PHACO AND INTRAOCULAR LENS PLACEMENT ([place]) left toric lens; Surgeon: Leandrew Koyanagi, MD; Location: [place]; Service: Ophthalmology; Laterality: Left; Toric lens  . COLONOSCOPY WITH PROPOFOL N/A 02/28/2015   Procedure: COLONOSCOPY WITH PROPOFOL; Surgeon: Manya Silvas, MD; Location: [facility] ENDOSCOPY; Service: Endoscopy; Laterality: N/A;  . ESOPHAGOGASTRODUODENOSCOPY (EGD) WITH PROPOFOL N/A 02/28/2015   Procedure: ESOPHAGOGASTRODUODENOSCOPY (EGD) WITH PROPOFOL; Surgeon: Manya Silvas, MD; Location: [facility] ENDOSCOPY; Service: Endoscopy; Laterality: N/A;  . JOINT REPLACEMENT  2002   hip  . PILONIDAL CYST EXCISION  1971  . REVISION TOTAL HIP ARTHROPLASTY  2003  . ROTATOR CUFF REPAIR  2016  . ruptured disc  1984         Family History  Problem Relation Age of Onset  . Lung disease Father   . Heart disease Father   . Lung disease Mother   . Cancer Brother    liver  . Cancer Maternal Aunt     Social History        Social History  Substance Use Topics  . Smoking status: Former Smoker    Packs/day: 3.00    Years: 24.00    Types: Cigarettes    Quit date: 03/08/1988  . Smokeless tobacco: Never Used  . Alcohol use 0.0 oz/week      Comment: occasional  No IVDU       Allergies  Allergen Reactions  . Etodolac Nausea And Vomiting  . Penicillins Other (See Comments)    Reaction: Unknown   . Tape Rash    Paper tape caues rash          Current Outpatient Prescriptions  Medication Sig Dispense Refill  . allopurinol (ZYLOPRIM) 300 MG tablet Take 300 mg by mouth as needed.     Marland Kitchen amitriptyline (ELAVIL) 25 MG tablet Take 25 mg by mouth at bedtime.    Marland Kitchen amLODipine-atorvastatin (CADUET) 5-40 MG tablet Take 1 tablet by mouth daily.    Marland Kitchen aspirin EC 81  MG EC tablet Take 1 tablet (81 mg total) by mouth daily.    . budesonide (ENTOCORT EC) 3 MG 24 hr capsule Take by mouth daily.    . diazepam (VALIUM) 10 MG tablet Take 1 tablet (10 mg total) by mouth every 8 (eight) hours as needed for anxiety. 30 tablet 0  . furosemide (LASIX) 40 MG tablet Take 40 mg by mouth daily.    . hydrocortisone valerate ointment (WESTCORT) 0.2 % Apply to affected area daily 45 g 1  . latanoprost (XALATAN) 0.005 % ophthalmic solution Place 1 drop into both eyes at bedtime.    Marland Kitchen olmesartan (BENICAR) 20 MG tablet Take 20 mg by mouth daily.    Marland Kitchen omeprazole (PRILOSEC) 40 MG capsule Take 40 mg by mouth 2 (two) times  daily.     Marland Kitchen oxyCODONE (OXY IR/ROXICODONE) 5 MG immediate release tablet Take 2 tablets (10 mg total) by mouth every 4 (four) hours as needed for moderate pain or severe pain. 30 tablet 0  . oxyCODONE-acetaminophen (ROXICET) 5-325 MG tablet Take 1 tablet by mouth every 6 (six) hours as needed for severe pain. 8 tablet 0  . OXYGEN Inhale 2.5 L into the lungs as needed.    . pramipexole (MIRAPEX) 0.5 MG tablet Take 0.5 mg by mouth 2 (two) times daily.    . pravastatin (PRAVACHOL) 40 MG tablet Take 40 mg by mouth at bedtime.     . propranolol (INDERAL) 40 MG tablet Take 40 mg by mouth 2 (two) times daily.    . timolol (TIMOPTIC) 0.5 % ophthalmic solution Place 1 drop into both eyes 2 (two) times daily.    Marland Kitchen topiramate (TOPAMAX) 100 MG tablet Take 100 mg by mouth 2 (two) times daily.    Marland Kitchen triamcinolone (NASACORT) 55 MCG/ACT AERO nasal inhaler Place 2 sprays into the nose 2 (two) times daily as needed.     . zolpidem (AMBIEN) 10 MG tablet Take 1 tablet (10 mg total) by mouth at bedtime as needed for sleep. 10 tablet 0   No current facility-administered medications for this visit.       REVIEW OF SYSTEMS(Negative unless checked)  Constitutional: [] Weight loss[] Fever[] Chills Cardiac:[] Chest pain[] Chest pressure[] Palpitations [] Shortness of breath when laying flat [] Shortness of breath at rest [] Shortness of breath with exertion. Vascular: [] Pain in legs with walking[] Pain in legsat rest[] Pain in legs when laying flat [] Claudication [] Pain in feet when walking [] Pain in feet at rest [] Pain in feet when laying flat [] History of DVT [] Phlebitis [x] Swelling in legs [] Varicose veins [] Non-healing ulcers Pulmonary: [x] Uses home oxygen [] Productive cough[] Hemoptysis [] Wheeze [x] COPD [] Asthma Neurologic: [] Dizziness [] Blackouts [] Seizures [] History of stroke [] History of TIA[] Aphasia [] Temporary blindness[] Dysphagia  [] Weaknessor numbness in arms [] Weakness or numbnessin legs Musculoskeletal: [x] Arthritis [] Joint swelling [] Joint pain [x] Low back pain Hematologic:[] Easy bruising[] Easy bleeding [] Hypercoagulable state [] Anemic [] Hepatitis Gastrointestinal:[] Blood in stool[] Vomiting blood[x] Gastroesophageal reflux/heartburn[] Abdominal pain Genitourinary: [x] Chronic kidney disease [] Difficulturination [] Frequenturination [] Burning with urination[] Hematuria Skin: [] Rashes [] Ulcers [] Wounds Psychological: [] History of anxiety[] History of major depression.      Physical Examination  BP (!) 188/90 (BP Location: Right Arm, Patient Position: Sitting)   Pulse (!) 105   Resp (!) 21   Ht 5' 10"  (1.778 m)   Wt (!) 163.7 kg (361 lb)   BMI 51.80 kg/m  Gen:  WD/WN, NAD. obese Head: [place]/AT, No temporalis wasting. Ear/Nose/Throat: Hearing grossly intact, nares w/o erythema or drainage, trachea midline Eyes: Conjunctiva clear. Sclera non-icteric Neck: Supple.  No JVD.  Pulmonary:  Good air movement, no use of accessory muscles.  Cardiac: RRR,  normal S1, S2 Vascular:  Vessel Right Left  Radial Palpable Palpable                                     Musculoskeletal: M/S 5/5 throughout.  No deformity or atrophy.  1+ right lower extremity edema, 2+ left lower extremity edema.  Small, superficial right lower leg ulceration without erythema Neurologic: Sensation grossly intact in extremities.  Symmetrical.  Speech is fluent.  Psychiatric: Judgment intact, Mood & affect appropriate for pt's clinical situation. Dermatologic: Small, superficial right lower leg ulceration without edema      Labs No results found for this or any previous visit (from the past 2160 hour(s)).  Radiology No results found.    Assessment/Plan Hypertension blood pressure control important in reducing the progression of atherosclerotic disease. On appropriate oral  medications.   Morbid obesity Worsens LE swelling   Lower limb ulcer, calf (HCC) Previous ulcer has healed, but now has another small ulceration.   Chronic venous insufficiency He would like to try to stay out of the Unna boots so we will continue to give this a chance to heal.  His swelling is fairly well controlled.  I will plan to check him back in about 3 months.    Leotis Pain, MD  03/23/2017 4:20 PM    This note was created with Dragon medical transcription system.  Any errors from dictation are purely unintentional

## 2017-04-02 ENCOUNTER — Encounter (INDEPENDENT_AMBULATORY_CARE_PROVIDER_SITE_OTHER): Payer: Self-pay | Admitting: Vascular Surgery

## 2017-04-02 ENCOUNTER — Ambulatory Visit (INDEPENDENT_AMBULATORY_CARE_PROVIDER_SITE_OTHER): Payer: BLUE CROSS/BLUE SHIELD | Admitting: Vascular Surgery

## 2017-04-02 VITALS — BP 199/98 | HR 103 | Resp 26 | Ht 69.0 in | Wt 356.0 lb

## 2017-04-02 DIAGNOSIS — I872 Venous insufficiency (chronic) (peripheral): Secondary | ICD-10-CM

## 2017-04-02 NOTE — Progress Notes (Signed)
History of Present Illness  There is no documented history at this time  Assessments & Plan   There are no diagnoses linked to this encounter.    Additional instructions  Subjective:  Patient presents with venous ulcer of the Bilateral lower extremity.    Procedure:  3 layer unna wrap was placed Bilateral lower extremity.   Plan:   Follow up in one week.  

## 2017-04-06 ENCOUNTER — Emergency Department
Admission: EM | Admit: 2017-04-06 | Discharge: 2017-04-06 | Disposition: A | Discharge status: Home / Self Care | Payer: BLUE CROSS/BLUE SHIELD | Attending: Emergency Medicine | Admitting: Emergency Medicine

## 2017-04-06 ENCOUNTER — Encounter: Payer: Self-pay | Admitting: Emergency Medicine

## 2017-04-06 ENCOUNTER — Emergency Department: Payer: BLUE CROSS/BLUE SHIELD

## 2017-04-06 DIAGNOSIS — Z96649 Presence of unspecified artificial hip joint: Secondary | ICD-10-CM | POA: Diagnosis not present

## 2017-04-06 DIAGNOSIS — Z87891 Personal history of nicotine dependence: Secondary | ICD-10-CM | POA: Diagnosis not present

## 2017-04-06 DIAGNOSIS — R0602 Shortness of breath: Secondary | ICD-10-CM | POA: Diagnosis present

## 2017-04-06 DIAGNOSIS — J441 Chronic obstructive pulmonary disease with (acute) exacerbation: Secondary | ICD-10-CM | POA: Insufficient documentation

## 2017-04-06 DIAGNOSIS — I10 Essential (primary) hypertension: Secondary | ICD-10-CM | POA: Insufficient documentation

## 2017-04-06 LAB — CBC WITH DIFFERENTIAL/PLATELET
Basophils Absolute: 0.1 10*3/uL (ref 0–0.1)
Basophils Relative: 1 %
EOS ABS: 0.4 10*3/uL (ref 0–0.7)
Eosinophils Relative: 5 %
HCT: 38.1 % — ABNORMAL LOW (ref 40.0–52.0)
HEMOGLOBIN: 12.6 g/dL — AB (ref 13.0–18.0)
LYMPHS ABS: 0.8 10*3/uL — AB (ref 1.0–3.6)
Lymphocytes Relative: 10 %
MCH: 28.2 pg (ref 26.0–34.0)
MCHC: 33 g/dL (ref 32.0–36.0)
MCV: 85.4 fL (ref 80.0–100.0)
Monocytes Absolute: 0.7 10*3/uL (ref 0.2–1.0)
Monocytes Relative: 10 %
NEUTROS PCT: 74 %
Neutro Abs: 5.6 10*3/uL (ref 1.4–6.5)
Platelets: 256 10*3/uL (ref 150–440)
RBC: 4.46 MIL/uL (ref 4.40–5.90)
RDW: 15.8 % — ABNORMAL HIGH (ref 11.5–14.5)
WBC: 7.5 10*3/uL (ref 3.8–10.6)

## 2017-04-06 LAB — BASIC METABOLIC PANEL
ANION GAP: 16 — AB (ref 5–15)
BUN: 30 mg/dL — AB (ref 6–20)
CHLORIDE: 107 mmol/L (ref 101–111)
CO2: 20 mmol/L — ABNORMAL LOW (ref 22–32)
Calcium: 8.4 mg/dL — ABNORMAL LOW (ref 8.9–10.3)
Creatinine, Ser: 1.59 mg/dL — ABNORMAL HIGH (ref 0.61–1.24)
GFR calc Af Amer: 51 mL/min — ABNORMAL LOW (ref >60)
GFR calc non Af Amer: 44 mL/min — ABNORMAL LOW (ref >60)
Glucose, Bld: 125 mg/dL — ABNORMAL HIGH (ref 65–99)
POTASSIUM: 3.2 mmol/L — AB (ref 3.5–5.1)
SODIUM: 143 mmol/L (ref 135–145)

## 2017-04-06 LAB — TROPONIN I

## 2017-04-06 LAB — BRAIN NATRIURETIC PEPTIDE: B NATRIURETIC PEPTIDE 5: 83 pg/mL (ref 0.0–100.0)

## 2017-04-06 MED ORDER — ALBUTEROL SULFATE HFA 108 (90 BASE) MCG/ACT IN AERS: 2.0000 | INHALATION_SPRAY | Freq: Four times a day (QID) | RESPIRATORY_TRACT | 2 refills | Status: AC | PRN

## 2017-04-06 MED ORDER — PREDNISONE 10 MG (21) PO TBPK: ORAL_TABLET | Freq: Every day | ORAL | 0 refills | Status: DC

## 2017-04-06 MED ORDER — METHYLPREDNISOLONE SODIUM SUCC 125 MG IJ SOLR
125.0000 mg | Freq: Once | INTRAMUSCULAR | Status: AC
  Administered 2017-04-06: 125 mg via INTRAVENOUS
  Filled 2017-04-06: qty 2

## 2017-04-06 MED ORDER — LEVOFLOXACIN 500 MG PO TABS: 500.0000 mg | ORAL_TABLET | Freq: Every day | ORAL | 0 refills | Status: AC

## 2017-04-06 MED ORDER — ALBUTEROL SULFATE (2.5 MG/3ML) 0.083% IN NEBU
5.0000 mg | INHALATION_SOLUTION | Freq: Once | RESPIRATORY_TRACT | Status: AC
  Administered 2017-04-06: 5 mg via RESPIRATORY_TRACT
  Filled 2017-04-06: qty 6

## 2017-04-06 MED ORDER — IPRATROPIUM-ALBUTEROL 0.5-2.5 (3) MG/3ML IN SOLN
3.0000 mL | Freq: Once | RESPIRATORY_TRACT | Status: AC
  Administered 2017-04-06: 3 mL via RESPIRATORY_TRACT
  Filled 2017-04-06: qty 3

## 2017-04-06 NOTE — ED Triage Notes (Signed)
Here from [place] office with shob post breathing treatment with a little relief, 3+ pitting edema noted bilaterally.  97% RA, no distress noted at this time. Is on PRN O2 at home, has COPD.

## 2017-04-06 NOTE — ED Provider Notes (Signed)
[facility] Emergency Department Provider Note       Time seen: ----------------------------------------- 10:17 AM on 04/06/2017 -----------------------------------------   I have reviewed the triage vital signs and the nursing notes.  HISTORY   Chief Complaint Shortness of Breath    HPI Ernest Sims is a 62 y.o. male with a history of anxiety, COPD, dyspnea, GERD, hypertension, hyperlipidemia who presents to the ED for shortness of breath.  Patient comes from primary care doctor's office with shortness of breath that persist after breathing treatment.  He was noted to have significant pitting edema.  Sats were 97% on room based on history of COPD.  Discomfort is 7 out of 10 and aching pain  Past Medical History:  Diagnosis Date  . Anxiety   . Arthritis   . Colitis   . COPD (chronic obstructive pulmonary disease) (HCC)    2.5 L O2 AT NIGHT  . Crohn disease ([place])   . Diverticulosis   . Dyspnea    OFTEN  . GERD (gastroesophageal reflux disease)   . Hypercholesteremia   . Hypertension   . Migraines   . Motion sickness   . Orthopnea    sleeps in chair  . Restless leg syndrome   . Seasonal allergies     Patient Active Problem List   Diagnosis Date Noted  . Lower limb ulcer, calf ([place]) 12/29/2016  . Chronic venous insufficiency 12/29/2016  . Hypertension 11/24/2016  . Swelling of limb 11/24/2016  . CVA (cerebral infarction) 04/26/2015  . Acid reflux 07/12/2014  . Hoarseness 07/12/2014  . Shortness of breath 05/22/2014  . Morbid obesity ([place]) 05/22/2014  . Physical deconditioning 05/22/2014    Past Surgical History:  Procedure Laterality Date  . CATARACT EXTRACTION W/PHACO Right 07/22/2016   Procedure: CATARACT EXTRACTION PHACO AND INTRAOCULAR LENS PLACEMENT ([place]) RIGHT;  Surgeon: Leandrew Koyanagi, MD;  Location: [place];  Service: Ophthalmology;  Laterality: Right;  IVA TOPICAL RIGHT TORIC  . CATARACT EXTRACTION  W/PHACO Left 08/05/2016   Procedure: CATARACT EXTRACTION PHACO AND INTRAOCULAR LENS PLACEMENT ([place])  left toric lens;  Surgeon: Leandrew Koyanagi, MD;  Location: [place];  Service: Ophthalmology;  Laterality: Left;  Toric lens  . COLONOSCOPY WITH PROPOFOL N/A 02/28/2015   Procedure: COLONOSCOPY WITH PROPOFOL;  Surgeon: Manya Silvas, MD;  Location: [facility] ENDOSCOPY;  Service: Endoscopy;  Laterality: N/A;  . ESOPHAGOGASTRODUODENOSCOPY (EGD) WITH PROPOFOL N/A 02/28/2015   Procedure: ESOPHAGOGASTRODUODENOSCOPY (EGD) WITH PROPOFOL;  Surgeon: Manya Silvas, MD;  Location: [facility] ENDOSCOPY;  Service: Endoscopy;  Laterality: N/A;  . JOINT REPLACEMENT  2002   hip  . PILONIDAL CYST EXCISION  1971  . REVISION TOTAL HIP ARTHROPLASTY  2003  . ROTATOR CUFF REPAIR  2016  . ruptured disc  1984    Allergies Etodolac; Penicillins; and Tape  Social History Social History   Tobacco Use  . Smoking status: Former Smoker    Packs/day: 3.00    Years: 24.00    Pack years: 72.00    Types: Cigarettes    Last attempt to quit: 03/08/1988    Years since quitting: 29.0  . Smokeless tobacco: Never Used  Substance Use Topics  . Alcohol use: Yes    Alcohol/week: 0.0 oz    Comment: occasional  . Drug use: Not on file    Review of Systems Constitutional: Negative for fever. ENT:  Negative for congestion, sore throat Cardiovascular: Negative for chest pain. Respiratory: Positive for shortness of breath Gastrointestinal: Negative for abdominal pain,  vomiting and diarrhea. Musculoskeletal: Negative for back pain. Skin: Negative for rash. Neurological: Negative for headaches, focal weakness or numbness.  All systems negative/normal/unremarkable except as stated in the HPI  ____________________________________________   PHYSICAL EXAM:  VITAL SIGNS: ED Triage Vitals  Enc Vitals Group     BP 04/06/17 1007 (!) 112/56     Pulse Rate 04/06/17 1007 72     Resp 04/06/17 1007 (!) 26     Temp  04/06/17 1007 97.7 F (36.5 C)     Temp Source 04/06/17 1007 Oral     SpO2 04/06/17 1007 96 %     Weight 04/06/17 1008 (!) 356 lb (161.5 kg)     Height 04/06/17 1008 5' 9"  (1.753 m)     Head Circumference --      Peak Flow --      Pain Score 04/06/17 1007 7     Pain Loc --      Pain Edu? --      Excl. in [place]? --     Constitutional: Alert and oriented. Well appearing and in no distress.  Obese Eyes: Conjunctivae are normal. Normal extraocular movements. ENT   Head: Normocephalic and atraumatic.   Nose: No congestion/rhinnorhea.   Mouth/Throat: Mucous membranes are moist.   Neck: No stridor. Cardiovascular: Normal rate, regular rhythm. No murmurs, rubs, or gallops. Respiratory: Normal respiratory effort without tachypnea nor retractions.  Mild end expiratory wheezing Gastrointestinal: Soft and nontender. Normal bowel sounds Musculoskeletal: Nontender with normal range of motion in extremities.  Peripheral edema with bilateral Unna boot dressings Neurologic:  Normal speech and language. No gross focal neurologic deficits are appreciated.  Skin:  Skin is warm, dry and intact. No rash noted. Psychiatric: Mood and affect are normal. Speech and behavior are normal.  ____________________________________________  EKG: Interpreted by me.  Sinus rhythm the rate of 71 bpm, normal PR interval, normal QRS, normal QT.  ____________________________________________  ED COURSE:  As part of my medical decision making, I reviewed the following data within the [place] History obtained from family if available, nursing notes, old chart and ekg, as well as notes from prior ED visits. Patient presented for dyspnea, we will assess with labs and imaging as indicated at this time.  Patient received DuoNeb and steroid treatments   Procedures ____________________________________________   LABS (pertinent positives/negatives)  Labs Reviewed  CBC WITH  DIFFERENTIAL/PLATELET - Abnormal; Notable for the following components:      Result Value   Hemoglobin 12.6 (*)    HCT 38.1 (*)    RDW 15.8 (*)    Lymphs Abs 0.8 (*)    All other components within normal limits  BASIC METABOLIC PANEL - Abnormal; Notable for the following components:   Potassium 3.2 (*)    CO2 20 (*)    Glucose, Bld 125 (*)    BUN 30 (*)    Creatinine, Ser 1.59 (*)    Calcium 8.4 (*)    GFR calc non Af Amer 44 (*)    GFR calc Af Amer 51 (*)    Anion gap 16 (*)    All other components within normal limits  TROPONIN I  BRAIN NATRIURETIC PEPTIDE    RADIOLOGY Images were viewed by me  Chest x-ray IMPRESSION: No active cardiopulmonary disease. ____________________________________________  DIFFERENTIAL DIAGNOSIS   COPD, URI, pneumonia, PE, pneumothorax, unstable angina  FINAL ASSESSMENT AND PLAN  Dyspnea, COPD exacerbation   Plan: Patient had presented for short of breath. Patient's labs revealed  mild renal insufficiency but not significant change from prior. Patient's imaging is unremarkable.  He will be discharged with breathing treatments, steroids and is otherwise stable for outpatient follow-up.   Laurence Aly, MD   Note: This note was generated in part or whole with voice recognition software. Voice recognition is usually quite accurate but there are transcription errors that can and very often do occur. I apologize for any typographical errors that were not detected and corrected.     Earleen Newport, MD 04/06/17 1247

## 2017-04-09 ENCOUNTER — Encounter (INDEPENDENT_AMBULATORY_CARE_PROVIDER_SITE_OTHER): Payer: Self-pay

## 2017-04-09 ENCOUNTER — Ambulatory Visit (INDEPENDENT_AMBULATORY_CARE_PROVIDER_SITE_OTHER): Payer: BLUE CROSS/BLUE SHIELD | Admitting: Vascular Surgery

## 2017-04-09 VITALS — BP 138/74 | HR 79 | Resp 20 | Wt 357.0 lb

## 2017-04-09 DIAGNOSIS — M7989 Other specified soft tissue disorders: Secondary | ICD-10-CM | POA: Diagnosis not present

## 2017-04-09 NOTE — Progress Notes (Signed)
History of Present Illness  There is no documented history at this time  Assessments & Plan   There are no diagnoses linked to this encounter.    Additional instructions  Subjective:  Patient presents with venous ulcer of the Bilateral lower extremity.    Procedure:  3 layer unna wrap was placed Bilateral lower extremity.   Plan:   Follow up in one week.  

## 2017-04-16 ENCOUNTER — Encounter (INDEPENDENT_AMBULATORY_CARE_PROVIDER_SITE_OTHER): Payer: Self-pay

## 2017-04-16 ENCOUNTER — Ambulatory Visit (INDEPENDENT_AMBULATORY_CARE_PROVIDER_SITE_OTHER): Payer: BLUE CROSS/BLUE SHIELD | Admitting: Vascular Surgery

## 2017-04-16 VITALS — BP 124/66 | HR 70 | Resp 20 | Ht 69.0 in | Wt 356.0 lb

## 2017-04-16 DIAGNOSIS — L97201 Non-pressure chronic ulcer of unspecified calf limited to breakdown of skin: Secondary | ICD-10-CM | POA: Diagnosis not present

## 2017-04-16 NOTE — Progress Notes (Signed)
History of Present Illness  There is no documented history at this time  Assessments & Plan   There are no diagnoses linked to this encounter.    Additional instructions  Subjective:  Patient presents with venous ulcer of the Bilateral lower extremity.    Procedure:  3 layer unna wrap was placed Bilateral lower extremity.   Plan:   Follow up in one week.  

## 2017-04-23 ENCOUNTER — Ambulatory Visit (INDEPENDENT_AMBULATORY_CARE_PROVIDER_SITE_OTHER): Payer: BLUE CROSS/BLUE SHIELD | Admitting: Vascular Surgery

## 2017-04-23 ENCOUNTER — Encounter (INDEPENDENT_AMBULATORY_CARE_PROVIDER_SITE_OTHER): Payer: Self-pay | Admitting: Vascular Surgery

## 2017-04-23 DIAGNOSIS — I872 Venous insufficiency (chronic) (peripheral): Secondary | ICD-10-CM

## 2017-04-23 NOTE — Progress Notes (Signed)
History of Present Illness  There is no documented history at this time  Assessments & Plan   There are no diagnoses linked to this encounter.    The patient's leg swelling was improved and his skin ulcerations have healed.  We are going to come out of the Unna boots today and go into compression stockings.

## 2017-04-30 ENCOUNTER — Ambulatory Visit (INDEPENDENT_AMBULATORY_CARE_PROVIDER_SITE_OTHER): Payer: BLUE CROSS/BLUE SHIELD | Admitting: Vascular Surgery

## 2017-06-08 ENCOUNTER — Other Ambulatory Visit: Payer: Self-pay

## 2017-06-08 ENCOUNTER — Encounter: Payer: BLUE CROSS/BLUE SHIELD | Attending: Rehabilitation

## 2017-06-08 VITALS — Ht 67.6 in | Wt 363.8 lb

## 2017-06-08 DIAGNOSIS — Z6841 Body Mass Index (BMI) 40.0 and over, adult: Secondary | ICD-10-CM | POA: Insufficient documentation

## 2017-06-08 DIAGNOSIS — Z87891 Personal history of nicotine dependence: Secondary | ICD-10-CM | POA: Insufficient documentation

## 2017-06-08 DIAGNOSIS — J449 Chronic obstructive pulmonary disease, unspecified: Secondary | ICD-10-CM | POA: Diagnosis not present

## 2017-06-08 NOTE — Progress Notes (Signed)
Daily Session Note  Patient Details  Name: Ernest Sims MRN: [id number] Date of Birth: December 05, 1955 Referring Provider:    Encounter Date: 06/08/2017  Check In: Session Check In - 06/08/17 1418      Check-In   Location  [facility]    Staff Present  Alberteen Sam, MA, RCEP, CCRP, Exercise Physiologist;Berman Grainger [place] physician immediately available to respond to emergencies  [facility] immediately available ER MD    Physician(s)  Dr. Reita Cliche and Jimmye Norman    Medication changes reported      No    Fall or balance concerns reported     No    Tobacco Cessation  No Change    Warm-up and Cool-down  Not performed (comment) medical evaluation    Resistance Training Performed  No    VAD Patient?  No      Pain Assessment   Currently in Pain?  No/denies          Social History   Tobacco Use  Smoking Status Former Smoker  . Packs/day: 3.00  . Years: 24.00  . Pack years: 72.00  . Types: Cigarettes  . Last attempt to quit: 03/08/1988  . Years since quitting: 29.2  Smokeless Tobacco Never Used    Goals Met:  No report of cardiac concerns or symptoms patient performed 6MWT  Goals Unmet:  Not Applicable  Comments: Patient stated that he did not sleep well last night and was falling asleep during his evaluation.His oxygen was 99 percent and heart rate was 69. If patient is still tired on his first day, his doctor will be notified. Patient was able to finish questions and his 6 minute walk.  6 Minute Walk    Row Name 06/08/17 1557         6 Minute Walk   Phase  Initial     Distance  110 feet     Walk Time  1.23 minutes     # of Rest Breaks  2 standing rest 10 sec, sat at 2:04 unable to get up to continue     MPH  1.01     METS  - unable to calculate, 1.77 per speed     RPE  17     Perceived Dyspnea   4     VO2 Peak  - unable to calculate (negative value)     Symptoms  Yes (comment)     Comments  using rollator for support, SOB,  knee pain 10/10, overall fatigued     Resting HR  68 bpm     Resting BP  134/62     Resting Oxygen Saturation   99 %     Exercise Oxygen Saturation  during 6 min walk  91 %     Max Ex. HR  91 bpm     Max Ex. BP  148/68     2 Minute Post BP  140/60       Interval HR   1 Minute HR  91     2 Minute HR  91     3 Minute HR  82     4 Minute HR  75     5 Minute HR  76     6 Minute HR  72     2 Minute Post HR  71     Interval Heart Rate?  Yes       Interval Oxygen   Interval Oxygen?  Yes  Baseline Oxygen Saturation %  99 %     1 Minute Oxygen Saturation %  96 % standing rest 1:30-1:40     1 Minute Liters of Oxygen  0 L Room Air     2 Minute Oxygen Saturation %  94 % seated rest at 2:04 Unable to get back up     2 Minute Liters of Oxygen  0 L     3 Minute Oxygen Saturation %  91 %     3 Minute Liters of Oxygen  0 L     4 Minute Oxygen Saturation %  97 %     4 Minute Liters of Oxygen  0 L     5 Minute Oxygen Saturation %  100 %     5 Minute Liters of Oxygen  0 L     6 Minute Oxygen Saturation %  98 %     6 Minute Liters of Oxygen  0 L     2 Minute Post Oxygen Saturation %  98 %     2 Minute Post Liters of Oxygen  0 L         Dr. Emily Filbert is Medical Director for [place] and [facility].

## 2017-06-08 NOTE — Patient Instructions (Signed)
Patient Instructions  Patient Details  Name: Ernest Sims MRN: [id number] Date of Birth: 09-01-1955 Referring Provider:  Lottie Rater*  Below are your personal goals for exercise, nutrition, and risk factors. Our goal is to help you stay on track towards obtaining and maintaining these goals. We will be discussing your progress on these goals with you throughout the program.  Initial Exercise Prescription: Initial Exercise Prescription - 06/08/17 1600      Date of Initial Exercise RX and Referring Provider   Date  06/08/17    Referring Provider  Lottie Rater MD      Treadmill   MPH  0.5    Grade  0    Minutes  15    METs  1.4      NuStep   Level  1    SPM  80    Minutes  15    METs  1.4      Arm Ergometer   Level  1    RPM  50    Minutes  15    METs  1.4      Prescription Details   Frequency (times per week)  3    Duration  Progress to 45 minutes of aerobic exercise without signs/symptoms of physical distress      Intensity   THRR 40-80% of Max Heartrate  103-138    Ratings of Perceived Exertion  11-13    Perceived Dyspnea  0-4      Progression   Progression  Continue to progress workloads to maintain intensity without signs/symptoms of physical distress.      Resistance Training   Training Prescription  Yes    Weight  3 lbs    Reps  10-15       Exercise Goals: Frequency: Be able to perform aerobic exercise two to three times per week in program working toward 2-5 days per week of home exercise.  Intensity: Work with a perceived exertion of 11 (fairly light) - 15 (hard) while following your exercise prescription.  We will make changes to your prescription with you as you progress through the program.   Duration: Be able to do 30 to 45 minutes of continuous aerobic exercise in addition to a 5 minute warm-up and a 5 minute cool-down routine.   Nutrition Goals: Your personal nutrition goals will be established when you do your  nutrition analysis with the dietician.  The following are general nutrition guidelines to follow: Cholesterol < 279m/day Sodium < 15019mday Fiber: Men over 50 yrs - 30 grams per day  Personal Goals: Personal Goals and Risk Factors at Admission - 06/08/17 1500      Core Components/Risk Factors/Patient Goals on Admission    Weight Management  Yes;Obesity;Weight Loss    Intervention  Weight Management: Develop a combined nutrition and exercise program designed to reach desired caloric intake, while maintaining appropriate intake of nutrient and fiber, sodium and fats, and appropriate energy expenditure required for the weight goal.;Weight Management: Provide education and appropriate resources to help participant work on and attain dietary goals.;Obesity: Provide education and appropriate resources to help participant work on and attain dietary goals.;Weight Management/Obesity: Establish reasonable short term and long term weight goals.    Admit Weight  363 lb 12.8 oz (165 kg)    Goal Weight: Short Term  355 lb (161 kg)    Goal Weight: Long Term  260 lb (117.9 kg)    Expected Outcomes  Short Term: Continue to assess and modify interventions  until short term weight is achieved;Long Term: Adherence to nutrition and physical activity/exercise program aimed toward attainment of established weight goal;Weight Maintenance: Understanding of the daily nutrition guidelines, which includes 25-35% calories from fat, 7% or less cal from saturated fats, less than 274m cholesterol, less than 1.5gm of sodium, & 5 or more servings of fruits and vegetables daily;Understanding recommendations for meals to include 15-35% energy as protein, 25-35% energy from fat, 35-60% energy from carbohydrates, less than 2026mof dietary cholesterol, 20-35 gm of total fiber daily;Weight Loss: Understanding of general recommendations for a balanced deficit meal plan, which promotes 1-2 lb weight loss per week and includes a negative  energy balance of [phone] kcal/d;Understanding of distribution of calorie intake throughout the day with the consumption of 4-5 meals/snacks    Improve shortness of breath with ADL's  Yes    Intervention  Provide education, individualized exercise plan and daily activity instruction to help decrease symptoms of SOB with activities of daily living.    Expected Outcomes  Short Term: Improve cardiorespiratory fitness to achieve a reduction of symptoms when performing ADLs;Long Term: Be able to perform more ADLs without symptoms or delay the onset of symptoms    Hypertension  Yes    Intervention  Provide education on lifestyle modifcations including regular physical activity/exercise, weight management, moderate sodium restriction and increased consumption of fresh fruit, vegetables, and low fat dairy, alcohol moderation, and smoking cessation.;Monitor prescription use compliance.    Expected Outcomes  Short Term: Continued assessment and intervention until BP is < 140/9071mG in hypertensive participants. < 130/52m74m in hypertensive participants with diabetes, heart failure or chronic kidney disease.;Long Term: Maintenance of blood pressure at goal levels.    Lipids  Yes    Intervention  Provide education and support for participant on nutrition & aerobic/resistive exercise along with prescribed medications to achieve LDL <70mg84mL >40mg.57mExpected Outcomes  Short Term: Participant states understanding of desired cholesterol values and is compliant with medications prescribed. Participant is following exercise prescription and nutrition guidelines.;Long Term: Cholesterol controlled with medications as prescribed, with individualized exercise RX and with personalized nutrition plan. Value goals: LDL < 70mg, 35m> 40 mg.       Tobacco Use Initial Evaluation: Social History   Tobacco Use  Smoking Status Former Smoker  . Packs/day: 3.00  . Years: 24.00  . Pack years: 72.00  . Types: Cigarettes   . Last attempt to quit: 03/08/1988  . Years since quitting: 29.2  Smokeless Tobacco Never Used    Exercise Goals and Review: Exercise Goals    Row Name 06/08/17 1608             Exercise Goals   Increase Physical Activity  Yes       Intervention  Provide advice, education, support and counseling about physical activity/exercise needs.;Develop an individualized exercise prescription for aerobic and resistive training based on initial evaluation findings, risk stratification, comorbidities and participant's personal goals.       Expected Outcomes  Short Term: Attend rehab on a regular basis to increase amount of physical activity.;Long Term: Add in home exercise to make exercise part of routine and to increase amount of physical activity.;Long Term: Exercising regularly at least 3-5 days a week.       Increase Strength and Stamina  Yes       Intervention  Provide advice, education, support and counseling about physical activity/exercise needs.;Develop an individualized exercise prescription for aerobic and resistive training based on  initial evaluation findings, risk stratification, comorbidities and participant's personal goals.       Expected Outcomes  Short Term: Perform resistance training exercises routinely during rehab and add in resistance training at home;Long Term: Improve cardiorespiratory fitness, muscular endurance and strength as measured by increased METs and functional capacity (6MWT);Short Term: Increase workloads from initial exercise prescription for resistance, speed, and METs.       Able to understand and use rate of perceived exertion (RPE) scale  Yes       Intervention  Provide education and explanation on how to use RPE scale       Expected Outcomes  Short Term: Able to use RPE daily in rehab to express subjective intensity level;Long Term:  Able to use RPE to guide intensity level when exercising independently       Able to understand and use Dyspnea scale  Yes        Intervention  Provide education and explanation on how to use Dyspnea scale       Expected Outcomes  Short Term: Able to use Dyspnea scale daily in rehab to express subjective sense of shortness of breath during exertion;Long Term: Able to use Dyspnea scale to guide intensity level when exercising independently       Knowledge and understanding of Target Heart Rate Range (THRR)  Yes       Intervention  Provide education and explanation of THRR including how the numbers were predicted and where they are located for reference       Expected Outcomes  Short Term: Able to state/look up THRR;Short Term: Able to use daily as guideline for intensity in rehab;Long Term: Able to use THRR to govern intensity when exercising independently       Able to check pulse independently  Yes       Intervention  Provide education and demonstration on how to check pulse in carotid and radial arteries.;Review the importance of being able to check your own pulse for safety during independent exercise       Expected Outcomes  Short Term: Able to explain why pulse checking is important during independent exercise;Long Term: Able to check pulse independently and accurately       Understanding of Exercise Prescription  Yes       Intervention  Provide education, explanation, and written materials on patient's individual exercise prescription       Expected Outcomes  Short Term: Able to explain program exercise prescription;Long Term: Able to explain home exercise prescription to exercise independently          Copy of goals given to participant.

## 2017-06-08 NOTE — Progress Notes (Signed)
Pulmonary Individual Treatment Plan  Patient Details  Name: Ernest Sims MRN: [id number] Date of Birth: [birth year] Referring Provider:     Pulmonary Rehab from 06/08/2017 in [facility] Cardiac and Pulmonary Rehab  Referring Provider  Lottie Rater MD      Initial Encounter Date:    Pulmonary Rehab from 06/08/2017 in [facility] Cardiac and Pulmonary Rehab  Date  06/08/17  Referring Provider  Lottie Rater MD      Visit Diagnosis: Chronic obstructive pulmonary disease, unspecified COPD type ([place])  Patient's Home Medications on Admission:  Current Outpatient Medications:  .  albuterol (PROVENTIL HFA;VENTOLIN HFA) 108 (90 Base) MCG/ACT inhaler, Inhale 2 puffs into the lungs every 6 (six) hours as needed for wheezing or shortness of breath., Disp: 1 Inhaler, Rfl: 2 .  allopurinol (ZYLOPRIM) 300 MG tablet, Take 300 mg by mouth as needed. , Disp: , Rfl:  .  amitriptyline (ELAVIL) 25 MG tablet, Take 25 mg by mouth at bedtime., Disp: , Rfl:  .  amLODipine-olmesartan (AZOR) 5-40 MG tablet, Take 1 tablet by mouth daily. , Disp: , Rfl:  .  aspirin EC 81 MG EC tablet, Take 1 tablet (81 mg total) by mouth daily. (Patient taking differently: Take 81 mg by mouth 2 (two) times daily. ), Disp: , Rfl:  .  diazepam (VALIUM) 10 MG tablet, Take 1 tablet (10 mg total) by mouth every 8 (eight) hours as needed for anxiety., Disp: 30 tablet, Rfl: 0 .  furosemide (LASIX) 40 MG tablet, Take 40 mg by mouth daily., Disp: , Rfl:  .  latanoprost (XALATAN) 0.005 % ophthalmic solution, Apply to eye., Disp: , Rfl:  .  levothyroxine (SYNTHROID, LEVOTHROID) 50 MCG tablet, Take 50 mcg by mouth daily before breakfast. , Disp: , Rfl:  .  metaxalone (SKELAXIN) 800 MG tablet, Take 800 mg by mouth., Disp: , Rfl:  .  omeprazole (PRILOSEC) 40 MG capsule, Take 40 mg by mouth 2 (two) times daily. , Disp: , Rfl:  .  oxyCODONE (OXY IR/ROXICODONE) 5 MG immediate release tablet, Take 2 tablets (10 mg total) by mouth every 4  (four) hours as needed for moderate pain or severe pain., Disp: 30 tablet, Rfl: 0 .  oxyCODONE-acetaminophen (ROXICET) 5-325 MG tablet, Take 1 tablet by mouth every 6 (six) hours as needed for severe pain., Disp: 8 tablet, Rfl: 0 .  OXYGEN, Inhale 2.5 L into the lungs as needed., Disp: , Rfl:  .  pramipexole (MIRAPEX) 0.5 MG tablet, Take 0.5 mg by mouth 2 (two) times daily., Disp: , Rfl:  .  pravastatin (PRAVACHOL) 40 MG tablet, Take 40 mg by mouth at bedtime. , Disp: , Rfl:  .  predniSONE (STERAPRED UNI-PAK 21 TAB) 10 MG (21) TBPK tablet, Take by mouth daily. Dispense steroid taper pack as directed (Patient not taking: Reported on 04/23/2017), Disp: 21 tablet, Rfl: 0 .  propranolol (INDERAL) 40 MG tablet, Take 40 mg by mouth 2 (two) times daily., Disp: , Rfl:  .  timolol (TIMOPTIC) 0.5 % ophthalmic solution, Place 1 drop into both eyes 2 (two) times daily., Disp: , Rfl:  .  Tiotropium Bromide-Olodaterol 2.5-2.5 MCG/ACT AERS, Inhale into the lungs., Disp: , Rfl:  .  topiramate (TOPAMAX) 100 MG tablet, Take 100 mg by mouth 2 (two) times daily., Disp: , Rfl:  .  torsemide (DEMADEX) 20 MG tablet, Take 10 mg by mouth., Disp: , Rfl:  .  triamcinolone (NASACORT) 55 MCG/ACT AERO nasal inhaler, Place 2 sprays into the nose 2 (two) times  daily as needed. , Disp: , Rfl:  .  zolpidem (AMBIEN) 10 MG tablet, Take 1 tablet (10 mg total) by mouth at bedtime as needed for sleep., Disp: 10 tablet, Rfl: 0  Past Medical History: Past Medical History:  Diagnosis Date  . Anxiety   . Arthritis   . Colitis   . COPD (chronic obstructive pulmonary disease) (HCC)    2.5 L O2 AT NIGHT  . Crohn disease ([place])   . Diverticulosis   . Dyspnea    OFTEN  . GERD (gastroesophageal reflux disease)   . Hypercholesteremia   . Hypertension   . Migraines   . Motion sickness   . Orthopnea    sleeps in chair  . Restless leg syndrome   . Seasonal allergies     Tobacco Use: Social History   Tobacco Use  Smoking Status  Former Smoker  . Packs/day: 3.00  . Years: 24.00  . Pack years: 72.00  . Types: Cigarettes  . Last attempt to quit: 03/08/1988  . Years since quitting: 29.2  Smokeless Tobacco Never Used    Labs: Recent Review Scientist, physiological    Labs for ITP Cardiac and Pulmonary Rehab Latest Ref Rng & Units 04/27/2015   Cholestrol 0 - 200 mg/dL 172   LDLCALC 0 - 99 mg/dL 73   HDL >40 mg/dL 40(L)   Trlycerides <150 mg/dL 297(H)       Pulmonary Assessment Scores: Pulmonary Assessment Scores    Row Name 06/08/17 1525         ADL UCSD   ADL Phase  Entry     SOB Score total  106     Rest  4     Walk  5     Stairs  5     Bath  4     Dress  5     Shop  5       CAT Score   CAT Score  30       mMRC Score   mMRC Score  4        Pulmonary Function Assessment: Pulmonary Function Assessment - 06/08/17 1456      Breath   Bilateral Breath Sounds  --    Shortness of Breath  --       Exercise Target Goals: Date: 06/08/17  Exercise Program Goal: Individual exercise prescription set using results from initial 6 min walk test and THRR while considering  patient's activity barriers and safety.    Exercise Prescription Goal: Initial exercise prescription builds to 30-45 minutes a day of aerobic activity, 2-3 days per week.  Home exercise guidelines will be given to patient during program as part of exercise prescription that the participant will acknowledge.  Activity Barriers & Risk Stratification: Activity Barriers & Cardiac Risk Stratification - 06/08/17 1601      Activity Barriers & Cardiac Risk Stratification   Activity Barriers  Arthritis;Right Hip Replacement;Muscular Weakness;Deconditioning;Shortness of Breath;Joint Problems;History of Falls;Balance Concerns R rotator cuff tear repair 3 yrs ago, THR 17 yrs ago (needs another), L shoulder occ pain       6 Minute Walk: 6 Minute Walk    Row Name 06/08/17 1557         6 Minute Walk   Phase  Initial     Distance  110 feet       Walk Time  1.23 minutes     # of Rest Breaks  2 standing rest 10 sec, sat at 2:04 unable to  get up to continue     MPH  1.01     METS  - unable to calculate, 1.77 per speed     RPE  17     Perceived Dyspnea   4     VO2 Peak  - unable to calculate (negative value)     Symptoms  Yes (comment)     Comments  using rollator for support, SOB, knee pain 10/10, overall fatigued     Resting HR  68 bpm     Resting BP  134/62     Resting Oxygen Saturation   99 %     Exercise Oxygen Saturation  during 6 min walk  91 %     Max Ex. HR  91 bpm     Max Ex. BP  148/68     2 Minute Post BP  140/60       Interval HR   1 Minute HR  91     2 Minute HR  91     3 Minute HR  82     4 Minute HR  75     5 Minute HR  76     6 Minute HR  72     2 Minute Post HR  71     Interval Heart Rate?  Yes       Interval Oxygen   Interval Oxygen?  Yes     Baseline Oxygen Saturation %  99 %     1 Minute Oxygen Saturation %  96 % standing rest 1:30-1:40     1 Minute Liters of Oxygen  0 L Room Air     2 Minute Oxygen Saturation %  94 % seated rest at 2:04 Unable to get back up     2 Minute Liters of Oxygen  0 L     3 Minute Oxygen Saturation %  91 %     3 Minute Liters of Oxygen  0 L     4 Minute Oxygen Saturation %  97 %     4 Minute Liters of Oxygen  0 L     5 Minute Oxygen Saturation %  100 %     5 Minute Liters of Oxygen  0 L     6 Minute Oxygen Saturation %  98 %     6 Minute Liters of Oxygen  0 L     2 Minute Post Oxygen Saturation %  98 %     2 Minute Post Liters of Oxygen  0 L       Oxygen Initial Assessment: Oxygen Initial Assessment - 06/08/17 1458      Home Oxygen   Home Oxygen Device  Home Concentrator;E-Tanks patient is confused about his oxygen.    Sleep Oxygen Prescription  CPAP    Liters per minute  2.5    Home Exercise Oxygen Prescription  Continuous    Liters per minute  2.5    Home at Rest Exercise Oxygen Prescription  Continuous    Liters per minute  2.5    Compliance with Home  Oxygen Use  Yes states he uses it at home becuase it makes him feel better      Initial 6 min Walk   Oxygen Used  None      Program Oxygen Prescription   Program Oxygen Prescription  None      Intervention   Short Term Goals  To learn and exhibit compliance with exercise, home and travel  O2 prescription;To learn and understand importance of maintaining oxygen saturations>88%;To learn and demonstrate proper use of respiratory medications;To learn and demonstrate proper pursed lip breathing techniques or other breathing techniques.;To learn and understand importance of monitoring SPO2 with pulse oximeter and demonstrate accurate use of the pulse oximeter.    Long  Term Goals  Exhibits compliance with exercise, home and travel O2 prescription;Verbalizes importance of monitoring SPO2 with pulse oximeter and return demonstration;Maintenance of O2 saturations>88%;Exhibits proper breathing techniques, such as pursed lip breathing or other method taught during program session;Compliance with respiratory medication;Demonstrates proper use of MDI's       Oxygen Re-Evaluation:   Oxygen Discharge (Final Oxygen Re-Evaluation):   Initial Exercise Prescription: Initial Exercise Prescription - 06/08/17 1600      Date of Initial Exercise RX and Referring Provider   Date  06/08/17    Referring Provider  Lottie Rater MD      Treadmill   MPH  0.5    Grade  0    Minutes  15    METs  1.4      NuStep   Level  1    SPM  80    Minutes  15    METs  1.4      Arm Ergometer   Level  1    RPM  50    Minutes  15    METs  1.4      Prescription Details   Frequency (times per week)  3    Duration  Progress to 45 minutes of aerobic exercise without signs/symptoms of physical distress      Intensity   THRR 40-80% of Max Heartrate  103-138    Ratings of Perceived Exertion  11-13    Perceived Dyspnea  0-4      Progression   Progression  Continue to progress workloads to maintain  intensity without signs/symptoms of physical distress.      Resistance Training   Training Prescription  Yes    Weight  3 lbs    Reps  10-15       Perform Capillary Blood Glucose checks as needed.  Exercise Prescription Changes: Exercise Prescription Changes    Row Name 06/08/17 1600             Response to Exercise   Blood Pressure (Admit)  134/62       Blood Pressure (Exercise)  148/68       Blood Pressure (Exit)  140/60       Heart Rate (Admit)  68 bpm       Heart Rate (Exercise)  91 bpm       Heart Rate (Exit)  71 bpm       Oxygen Saturation (Admit)  99 %       Oxygen Saturation (Exercise)  91 %       Oxygen Saturation (Exit)  98 %       Rating of Perceived Exertion (Exercise)  17       Perceived Dyspnea (Exercise)  4       Symptoms  SOB, knee pain 10/10, overall fatigued, used rollator       Comments  walk test results          Exercise Comments:   Exercise Goals and Review: Exercise Goals    Row Name 06/08/17 1608             Exercise Goals   Increase Physical Activity  Yes  Intervention  Provide advice, education, support and counseling about physical activity/exercise needs.;Develop an individualized exercise prescription for aerobic and resistive training based on initial evaluation findings, risk stratification, comorbidities and participant's personal goals.       Expected Outcomes  Short Term: Attend rehab on a regular basis to increase amount of physical activity.;Long Term: Add in home exercise to make exercise part of routine and to increase amount of physical activity.;Long Term: Exercising regularly at least 3-5 days a week.       Increase Strength and Stamina  Yes       Intervention  Provide advice, education, support and counseling about physical activity/exercise needs.;Develop an individualized exercise prescription for aerobic and resistive training based on initial evaluation findings, risk stratification, comorbidities and participant's  personal goals.       Expected Outcomes  Short Term: Perform resistance training exercises routinely during rehab and add in resistance training at home;Long Term: Improve cardiorespiratory fitness, muscular endurance and strength as measured by increased METs and functional capacity (6MWT);Short Term: Increase workloads from initial exercise prescription for resistance, speed, and METs.       Able to understand and use rate of perceived exertion (RPE) scale  Yes       Intervention  Provide education and explanation on how to use RPE scale       Expected Outcomes  Short Term: Able to use RPE daily in rehab to express subjective intensity level;Long Term:  Able to use RPE to guide intensity level when exercising independently       Able to understand and use Dyspnea scale  Yes       Intervention  Provide education and explanation on how to use Dyspnea scale       Expected Outcomes  Short Term: Able to use Dyspnea scale daily in rehab to express subjective sense of shortness of breath during exertion;Long Term: Able to use Dyspnea scale to guide intensity level when exercising independently       Knowledge and understanding of Target Heart Rate Range (THRR)  Yes       Intervention  Provide education and explanation of THRR including how the numbers were predicted and where they are located for reference       Expected Outcomes  Short Term: Able to state/look up THRR;Short Term: Able to use daily as guideline for intensity in rehab;Long Term: Able to use THRR to govern intensity when exercising independently       Able to check pulse independently  Yes       Intervention  Provide education and demonstration on how to check pulse in carotid and radial arteries.;Review the importance of being able to check your own pulse for safety during independent exercise       Expected Outcomes  Short Term: Able to explain why pulse checking is important during independent exercise;Long Term: Able to check pulse  independently and accurately       Understanding of Exercise Prescription  Yes       Intervention  Provide education, explanation, and written materials on patient's individual exercise prescription       Expected Outcomes  Short Term: Able to explain program exercise prescription;Long Term: Able to explain home exercise prescription to exercise independently          Exercise Goals Re-Evaluation :   Discharge Exercise Prescription (Final Exercise Prescription Changes): Exercise Prescription Changes - 06/08/17 1600      Response to Exercise   Blood Pressure (Admit)  134/62    Blood Pressure (Exercise)  148/68    Blood Pressure (Exit)  140/60    Heart Rate (Admit)  68 bpm    Heart Rate (Exercise)  91 bpm    Heart Rate (Exit)  71 bpm    Oxygen Saturation (Admit)  99 %    Oxygen Saturation (Exercise)  91 %    Oxygen Saturation (Exit)  98 %    Rating of Perceived Exertion (Exercise)  17    Perceived Dyspnea (Exercise)  4    Symptoms  SOB, knee pain 10/10, overall fatigued, used rollator    Comments  walk test results       Nutrition:  Target Goals: Understanding of nutrition guidelines, daily intake of sodium <1553m, cholesterol <2045m calories 30% from fat and 7% or less from saturated fats, daily to have 5 or more servings of fruits and vegetables.  Biometrics: Pre Biometrics - 06/08/17 1609      Pre Biometrics   Height  5' 7.6" (1.717 m)    Weight  363 lb 12.8 oz (165 kg)  (Abnormal)     Waist Circumference  52 inches    Hip Circumference  64 inches    Waist to Hip Ratio  0.81 %    BMI (Calculated)  55.97        Nutrition Therapy Plan and Nutrition Goals: Nutrition Therapy & Goals - 06/08/17 1455      Personal Nutrition Goals   Comments  Wants to learn how to eat better. Weight loss       Intervention Plan   Intervention  Prescribe, educate and counsel regarding individualized specific dietary modifications aiming towards targeted core components such as weight,  hypertension, lipid management, diabetes, heart failure and other comorbidities.;Nutrition handout(s) given to patient.    Expected Outcomes  Short Term Goal: Understand basic principles of dietary content, such as calories, fat, sodium, cholesterol and nutrients.;Long Term Goal: Adherence to prescribed nutrition plan.       Nutrition Assessments: Nutrition Assessments - 06/08/17 1533      MEDFICTS Scores   Pre Score  -- gave handouts to patient. Patient was unable to fill out due to falling asleep, Informed patient to bring back on his first day.       Nutrition Goals Re-Evaluation:   Nutrition Goals Discharge (Final Nutrition Goals Re-Evaluation):   Psychosocial: Target Goals: Acknowledge presence or absence of significant depression and/or stress, maximize coping skills, provide positive support system. Participant is able to verbalize types and ability to use techniques and skills needed for reducing stress and depression.   Initial Review & Psychosocial Screening: Initial Psych Review & Screening - 06/08/17 1453      Initial Review   Current issues with  Current Sleep Concerns;Current Stress Concerns    Source of Stress Concerns  Chronic Illness    Comments  His breathing stresses him out.      Family Dynamics   Good Support System?  Yes    Comments  He can look to his wife and daughter for support      Barriers   Psychosocial barriers to participate in program  The patient should benefit from training in stress management and relaxation.      Screening Interventions   Interventions  Encouraged to exercise;Provide feedback about the scores to participant;Program counselor consult;To provide support and resources with identified psychosocial needs    Expected Outcomes  Short Term goal: Utilizing psychosocial counselor, staff and physician to assist with identification  of specific Stressors or current issues interfering with healing process. Setting desired goal for each  stressor or current issue identified.;Long Term Goal: Stressors or current issues are controlled or eliminated.;Long Term goal: The participant improves quality of Life and PHQ9 Scores as seen by post scores and/or verbalization of changes;Short Term goal: Identification and review with participant of any Quality of Life or Depression concerns found by scoring the questionnaire.       Quality of Life Scores:  Scores of 19 and below usually indicate a poorer quality of life in these areas.  A difference of  2-3 points is a clinically meaningful difference.  A difference of 2-3 points in the total score of the Quality of Life Index has been associated with significant improvement in overall quality of life, self-image, physical symptoms, and general health in studies assessing change in quality of life.  PHQ-9: Recent Review Flowsheet Data    Depression screen [facility] 2/9 06/08/2017   Decreased Interest 1   Down, Depressed, Hopeless 1   PHQ - 2 Score 2   Altered sleeping 1   Tired, decreased energy 1   Change in appetite 2   Feeling bad or failure about yourself  0   Trouble concentrating 0   Moving slowly or fidgety/restless 1   Suicidal thoughts 0   PHQ-9 Score 7   Difficult doing work/chores Somewhat difficult     Interpretation of Total Score  Total Score Depression Severity:  1-4 = Minimal depression, 5-9 = Mild depression, 10-14 = Moderate depression, 15-19 = Moderately severe depression, 20-27 = Severe depression   Psychosocial Evaluation and Intervention:   Psychosocial Re-Evaluation:   Psychosocial Discharge (Final Psychosocial Re-Evaluation):   Education: Education Goals: Education classes will be provided on a weekly basis, covering required topics. Participant will state understanding/return demonstration of topics presented.  Learning Barriers/Preferences: Learning Barriers/Preferences - 06/08/17 1457      Learning Barriers/Preferences   Learning Barriers  Sight  wears glasses    Learning Preferences  None       Education Topics:  Initial Evaluation Education: - Verbal, written and demonstration of respiratory meds, oximetry and breathing techniques. Instruction on use of nebulizers and MDIs and importance of monitoring MDI activations.   Pulmonary Rehab from 06/08/2017 in [facility] Cardiac and Pulmonary Rehab  Date  06/08/17  Educator  [facility]  Instruction Review Code  1- Verbalizes Understanding      General Nutrition Guidelines/Fats and Fiber: -Group instruction provided by verbal, written material, models and posters to present the general guidelines for heart healthy nutrition. Gives an explanation and review of dietary fats and fiber.   Controlling Sodium/Reading Food Labels: -Group verbal and written material supporting the discussion of sodium use in heart healthy nutrition. Review and explanation with models, verbal and written materials for utilization of the food label.   Exercise Physiology & General Exercise Guidelines: - Group verbal and written instruction with models to review the exercise physiology of the cardiovascular system and associated critical values. Provides general exercise guidelines with specific guidelines to those with heart or lung disease.    Aerobic Exercise & Resistance Training: - Gives group verbal and written instruction on the various components of exercise. Focuses on aerobic and resistive training programs and the benefits of this training and how to safely progress through these programs.   Flexibility, Balance, Mind/Body Relaxation: Provides group verbal/written instruction on the benefits of flexibility and balance training, including mind/body exercise modes such as yoga, pilates and tai chi.  Demonstration and skill practice provided.   Stress and Anxiety: - Provides group verbal and written instruction about the health risks of elevated stress and causes of high stress.  Discuss the correlation between  heart/lung disease and anxiety and treatment options. Review healthy ways to manage with stress and anxiety.   Depression: - Provides group verbal and written instruction on the correlation between heart/lung disease and depressed mood, treatment options, and the stigmas associated with seeking treatment.   Exercise & Equipment Safety: - Individual verbal instruction and demonstration of equipment use and safety with use of the equipment.   Pulmonary Rehab from 06/08/2017 in [facility] Cardiac and Pulmonary Rehab  Date  06/08/17  Educator  [facility]  Instruction Review Code  1- Verbalizes Understanding      Infection Prevention: - Provides verbal and written material to individual with discussion of infection control including proper hand washing and proper equipment cleaning during exercise session.   Pulmonary Rehab from 06/08/2017 in [facility] Cardiac and Pulmonary Rehab  Date  06/08/17  Educator  [facility]  Instruction Review Code  1- Verbalizes Understanding      Falls Prevention: - Provides verbal and written material to individual with discussion of falls prevention and safety.   Pulmonary Rehab from 06/08/2017 in [facility] Cardiac and Pulmonary Rehab  Date  06/08/17  Educator  [facility]  Instruction Review Code  1- Verbalizes Understanding      Diabetes: - Individual verbal and written instruction to review signs/symptoms of diabetes, desired ranges of glucose level fasting, after meals and with exercise. Advice that pre and post exercise glucose checks will be done for 3 sessions at entry of program.   Chronic Lung Diseases: - Group verbal and written instruction to review updates, respiratory medications, advancements in procedures and treatments. Discuss use of supplemental oxygen including available portable oxygen systems, continuous and intermittent flow rates, concentrators, personal use and safety guidelines. Review proper use of inhaler and spacers. Provide informative websites for self-education.     Energy Conservation: - Provide group verbal and written instruction for methods to conserve energy, plan and organize activities. Instruct on pacing techniques, use of adaptive equipment and posture/positioning to relieve shortness of breath.   Triggers and Exacerbations: - Group verbal and written instruction to review types of environmental triggers and ways to prevent exacerbations. Discuss weather changes, air quality and the benefits of nasal washing. Review warning signs and symptoms to help prevent infections. Discuss techniques for effective airway clearance, coughing, and vibrations.   AED/CPR: - Group verbal and written instruction with the use of models to demonstrate the basic use of the AED with the basic ABC's of resuscitation.   Anatomy and Physiology of the Lungs: - Group verbal and written instruction with the use of models to provide basic lung anatomy and physiology related to function, structure and complications of lung disease.   Anatomy & Physiology of the Heart: - Group verbal and written instruction and models provide basic cardiac anatomy and physiology, with the coronary electrical and arterial systems. Review of Valvular disease and Heart Failure   Cardiac Medications: - Group verbal and written instruction to review commonly prescribed medications for heart disease. Reviews the medication, class of the drug, and side effects.   Know Your Numbers and Risk Factors: -Group verbal and written instruction about important numbers in your health.  Discussion of what are risk factors and how they play a role in the disease process.  Review of Cholesterol, Blood Pressure, Diabetes, and BMI and the  role they play in your overall health.   Sleep Hygiene: -Provides group verbal and written instruction about how sleep can affect your health.  Define sleep hygiene, discuss sleep cycles and impact of sleep habits. Review good sleep hygiene tips.     Other: -Provides group and verbal instruction on various topics (see comments)    Knowledge Questionnaire Score: Knowledge Questionnaire Score - 06/08/17 1457      Knowledge Questionnaire Score   Pre Score  13/18 reviewed with patient        Core Components/Risk Factors/Patient Goals at Admission: Personal Goals and Risk Factors at Admission - 06/08/17 1500      Core Components/Risk Factors/Patient Goals on Admission    Weight Management  Yes;Obesity;Weight Loss    Intervention  Weight Management: Develop a combined nutrition and exercise program designed to reach desired caloric intake, while maintaining appropriate intake of nutrient and fiber, sodium and fats, and appropriate energy expenditure required for the weight goal.;Weight Management: Provide education and appropriate resources to help participant work on and attain dietary goals.;Obesity: Provide education and appropriate resources to help participant work on and attain dietary goals.;Weight Management/Obesity: Establish reasonable short term and long term weight goals.    Admit Weight  363 lb 12.8 oz (165 kg)    Goal Weight: Short Term  355 lb (161 kg)    Goal Weight: Long Term  260 lb (117.9 kg)    Expected Outcomes  Short Term: Continue to assess and modify interventions until short term weight is achieved;Long Term: Adherence to nutrition and physical activity/exercise program aimed toward attainment of established weight goal;Weight Maintenance: Understanding of the daily nutrition guidelines, which includes 25-35% calories from fat, 7% or less cal from saturated fats, less than 250m cholesterol, less than 1.5gm of sodium, & 5 or more servings of fruits and vegetables daily;Understanding recommendations for meals to include 15-35% energy as protein, 25-35% energy from fat, 35-60% energy from carbohydrates, less than 2054mof dietary cholesterol, 20-35 gm of total fiber daily;Weight Loss: Understanding of general  recommendations for a balanced deficit meal plan, which promotes 1-2 lb weight loss per week and includes a negative energy balance of [id number] kcal/d;Understanding of distribution of calorie intake throughout the day with the consumption of 4-5 meals/snacks    Improve shortness of breath with ADL's  Yes    Intervention  Provide education, individualized exercise plan and daily activity instruction to help decrease symptoms of SOB with activities of daily living.    Expected Outcomes  Short Term: Improve cardiorespiratory fitness to achieve a reduction of symptoms when performing ADLs;Long Term: Be able to perform more ADLs without symptoms or delay the onset of symptoms    Hypertension  Yes    Intervention  Provide education on lifestyle modifcations including regular physical activity/exercise, weight management, moderate sodium restriction and increased consumption of fresh fruit, vegetables, and low fat dairy, alcohol moderation, and smoking cessation.;Monitor prescription use compliance.    Expected Outcomes  Short Term: Continued assessment and intervention until BP is < 140/9064mG in hypertensive participants. < 130/76m86m in hypertensive participants with diabetes, heart failure or chronic kidney disease.;Long Term: Maintenance of blood pressure at goal levels.    Lipids  Yes    Intervention  Provide education and support for participant on nutrition & aerobic/resistive exercise along with prescribed medications to achieve LDL <70mg22mL >40mg.31mExpected Outcomes  Short Term: Participant states understanding of desired cholesterol values and is compliant with medications prescribed. Participant  is following exercise prescription and nutrition guidelines.;Long Term: Cholesterol controlled with medications as prescribed, with individualized exercise RX and with personalized nutrition plan. Value goals: LDL < 67m, HDL > 40 mg.       Core Components/Risk Factors/Patient Goals Review:    Core  Components/Risk Factors/Patient Goals at Discharge (Final Review):    ITP Comments: ITP Comments    Row Name 06/08/17 1419           ITP Comments  Medical Evaluation completed. Chart sent for review and changes to Dr. MEmily FilbertDirector of [place] [facility] can be [facility]          Comments: Initial ITP

## 2017-06-17 ENCOUNTER — Telehealth: Payer: Self-pay | Admitting: *Deleted

## 2017-06-17 NOTE — Telephone Encounter (Signed)
Desi called to tell us he cannot start on Friday as [employer] is coming at the same time to install oxygen.  He has been rescheduled for Mon 4/22 to start

## 2017-06-22 ENCOUNTER — Ambulatory Visit (INDEPENDENT_AMBULATORY_CARE_PROVIDER_SITE_OTHER): Payer: BLUE CROSS/BLUE SHIELD | Admitting: Vascular Surgery

## 2017-06-23 ENCOUNTER — Encounter: Payer: BLUE CROSS/BLUE SHIELD | Admitting: *Deleted

## 2017-06-23 DIAGNOSIS — J449 Chronic obstructive pulmonary disease, unspecified: Secondary | ICD-10-CM

## 2017-06-23 NOTE — Progress Notes (Signed)
Daily Session Note  Patient Details  Name: Ernest Sims MRN: [id number] Date of Birth: 02-Jan-1956 Referring Provider:     Pulmonary Rehab from 06/08/2017 in [facility] Cardiac and Pulmonary Rehab  Referring Provider  Lottie Rater MD      Encounter Date: 06/23/2017  Check In: Session Check In - 06/23/17 1129      Check-In   Location  [facility]-Cardiac & Pulmonary Rehab    Staff Present  Alberteen Sam, MA, RCEP, CCRP, Exercise Physiologist;Meredith Sherryll Burger, RN BSN;Joseph Flavia Shipper    Supervising physician immediately available to respond to emergencies  [facility] immediately available ER MD    Physician(s)  Dr. Archie Balboa and Joni Fears    Medication changes reported      No    Fall or balance concerns reported     No    Warm-up and Cool-down  Performed as group-led instruction    Resistance Training Performed  Yes    VAD Patient?  No      Pain Assessment   Currently in Pain?  No/denies          Social History   Tobacco Use  Smoking Status Former Smoker  . Packs/day: 3.00  . Years: 24.00  . Pack years: 72.00  . Types: Cigarettes  . Last attempt to quit: 03/08/1988  . Years since quitting: 29.3  Smokeless Tobacco Never Used    Goals Met:  Proper associated with RPD/PD & O2 Sat Exercise tolerated well Personal goals reviewed Queuing for purse lip breathing No report of cardiac concerns or symptoms Strength training completed today  Goals Unmet:  Not Applicable  Comments: First full day of exercise!  Patient was oriented to gym and equipment including functions, settings, policies, and procedures.  Patient's individual exercise prescription and treatment plan were reviewed.  All starting workloads were established based on the results of the 6 minute walk test done at initial orientation visit.  The plan for exercise progression was also introduced and progression will be customized based on patient's performance and goals.    Dr. Emily Filbert is Medical  Director for [place] and [facility].

## 2017-06-25 DIAGNOSIS — J449 Chronic obstructive pulmonary disease, unspecified: Secondary | ICD-10-CM | POA: Diagnosis not present

## 2017-06-25 NOTE — Progress Notes (Signed)
Daily Session Note  Patient Details  Name: Ernest Sims MRN: [id number] Date of Birth: 14-Apr-1955 Referring Provider:     Pulmonary Rehab from 06/08/2017 in [facility] Cardiac and Pulmonary Rehab  Referring Provider  Lottie Rater MD      Encounter Date: 06/25/2017  Check In: Session Check In - 06/25/17 1126      Check-In   Location  [facility]-Cardiac & Pulmonary Rehab    Staff Present  Alberteen Sam, MA, RCEP, CCRP, Exercise Physiologist;Rain Wilhide Alcus Dad, RN BSN    Supervising physician immediately available to respond to emergencies  [facility] immediately available ER MD    Physician(s)  Dr. Jimmye Norman and Corky Downs    Medication changes reported      No    Fall or balance concerns reported     No    Tobacco Cessation  No Change    Warm-up and Cool-down  Performed on first and last piece of equipment    Resistance Training Performed  Yes    VAD Patient?  No      Pain Assessment   Currently in Pain?  No/denies          Social History   Tobacco Use  Smoking Status Former Smoker  . Packs/day: 3.00  . Years: 24.00  . Pack years: 72.00  . Types: Cigarettes  . Last attempt to quit: 03/08/1988  . Years since quitting: 29.3  Smokeless Tobacco Never Used    Goals Met:  Exercise tolerated well No report of cardiac concerns or symptoms Strength training completed today  Goals Unmet:  Not Applicable  Comments: Pt able to follow exercise prescription today without complaint.  Will continue to monitor for progression.   Dr. Emily Filbert is Medical Director for [place] and [facility].

## 2017-06-28 DIAGNOSIS — J449 Chronic obstructive pulmonary disease, unspecified: Secondary | ICD-10-CM | POA: Diagnosis not present

## 2017-06-28 NOTE — Progress Notes (Signed)
Daily Session Note  Patient Details  Name: Ernest Sims MRN: [id number] Date of Birth: 23-Jan-1956 Referring Provider:     Pulmonary Rehab from 06/08/2017 in [facility] Cardiac and Pulmonary Rehab  Referring Provider  Lottie Rater MD      Encounter Date: 06/28/2017  Check In: Session Check In - 06/28/17 1233      Check-In   Location  [facility]-Cardiac & Pulmonary Rehab    Staff Present  Justin Mend RCP,RRT,BSRT;Laureen Owens Shark, BS, RRT, Respiratory Bertis Ruddy, BS, ACSM CEP, Exercise Physiologist    Supervising physician immediately available to respond to emergencies  [facility] immediately available ER MD    Physician(s)  Dr. Corky Downs and Alfred Levins    Medication changes reported      No    Fall or balance concerns reported     No    Tobacco Cessation  No Change    Warm-up and Cool-down  Performed as group-led instruction    Resistance Training Performed  Yes    VAD Patient?  No      Pain Assessment   Currently in Pain?  No/denies          Social History   Tobacco Use  Smoking Status Former Smoker  . Packs/day: 3.00  . Years: 24.00  . Pack years: 72.00  . Types: Cigarettes  . Last attempt to quit: 03/08/1988  . Years since quitting: 29.3  Smokeless Tobacco Never Used    Goals Met:  Independence with exercise equipment Exercise tolerated well No report of cardiac concerns or symptoms Strength training completed today  Goals Unmet:  Not Applicable  Comments: Pt able to follow exercise prescription today without complaint.  Will continue to monitor for progression.   Dr. Emily Filbert is Medical Director for [place] and [facility].

## 2017-06-29 ENCOUNTER — Ambulatory Visit (INDEPENDENT_AMBULATORY_CARE_PROVIDER_SITE_OTHER): Payer: BLUE CROSS/BLUE SHIELD | Admitting: Vascular Surgery

## 2017-06-29 ENCOUNTER — Encounter (INDEPENDENT_AMBULATORY_CARE_PROVIDER_SITE_OTHER): Payer: Self-pay | Admitting: Vascular Surgery

## 2017-06-29 VITALS — BP 149/71 | HR 81 | Resp 17 | Ht 69.0 in

## 2017-06-29 DIAGNOSIS — M7989 Other specified soft tissue disorders: Secondary | ICD-10-CM

## 2017-06-29 DIAGNOSIS — I10 Essential (primary) hypertension: Secondary | ICD-10-CM

## 2017-06-29 DIAGNOSIS — I872 Venous insufficiency (chronic) (peripheral): Secondary | ICD-10-CM

## 2017-06-29 NOTE — Assessment & Plan Note (Signed)
Reasonable control with conservative measures.  Continue compression stockings and elevation.  Return to clinic in 6 months

## 2017-06-29 NOTE — Progress Notes (Signed)
MRN : [id number]  Ernest Sims is a 62 y.o. ([birth year]) male who presents with chief complaint of  Chief Complaint  Patient presents with  . Follow-up    75monthfollow up  .  History of Present Illness: Patient returns today in follow up of leg swelling.  He has had no recurrent ulceration.  His leg swelling is reasonably well controlled with compression stockings and elevation.  A bigger issue for him currently is that he is going to need a redo hip replacement on the right.  Current Outpatient Medications  Medication Sig Dispense Refill  . albuterol (PROVENTIL HFA;VENTOLIN HFA) 108 (90 Base) MCG/ACT inhaler Inhale 2 puffs into the lungs every 6 (six) hours as needed for wheezing or shortness of breath. 1 Inhaler 2  . allopurinol (ZYLOPRIM) 300 MG tablet Take 300 mg by mouth as needed.     .Marland Kitchenamitriptyline (ELAVIL) 25 MG tablet Take 25 mg by mouth at bedtime.    .Marland KitchenamLODipine-olmesartan (AZOR) 5-40 MG tablet Take 1 tablet by mouth daily.     .Marland Kitchenaspirin EC 81 MG EC tablet Take 1 tablet (81 mg total) by mouth daily. (Patient taking differently: Take 81 mg by mouth 2 (two) times daily. )    . diazepam (VALIUM) 10 MG tablet Take 1 tablet (10 mg total) by mouth every 8 (eight) hours as needed for anxiety. 30 tablet 0  . furosemide (LASIX) 40 MG tablet Take 40 mg by mouth daily.    .Marland Kitchenlatanoprost (XALATAN) 0.005 % ophthalmic solution Apply to eye.    . levothyroxine (SYNTHROID, LEVOTHROID) 50 MCG tablet Take 50 mcg by mouth daily before breakfast.     . metaxalone (SKELAXIN) 800 MG tablet Take 800 mg by mouth.    .Marland Kitchenomeprazole (PRILOSEC) 40 MG capsule Take 40 mg by mouth 2 (two) times daily.     .Marland KitchenoxyCODONE-acetaminophen (ROXICET) 5-325 MG tablet Take 1 tablet by mouth every 6 (six) hours as needed for severe pain. 8 tablet 0  . OXYGEN Inhale 2.5 L into the lungs as needed.    . pramipexole (MIRAPEX) 0.5 MG tablet Take 0.5 mg by mouth 2 (two) times daily.    . pravastatin (PRAVACHOL) 40 MG  tablet Take 40 mg by mouth at bedtime.     . propranolol (INDERAL) 40 MG tablet Take 40 mg by mouth 2 (two) times daily.    . timolol (TIMOPTIC) 0.5 % ophthalmic solution Place 1 drop into both eyes 2 (two) times daily.    . Tiotropium Bromide-Olodaterol 2.5-2.5 MCG/ACT AERS Inhale into the lungs.    . topiramate (TOPAMAX) 100 MG tablet Take 100 mg by mouth 2 (two) times daily.    .Marland Kitchentorsemide (DEMADEX) 20 MG tablet Take 10 mg by mouth.    . triamcinolone (NASACORT) 55 MCG/ACT AERO nasal inhaler Place 2 sprays into the nose 2 (two) times daily as needed.     . zolpidem (AMBIEN) 10 MG tablet Take 1 tablet (10 mg total) by mouth at bedtime as needed for sleep. 10 tablet 0  . oxyCODONE (OXY IR/ROXICODONE) 5 MG immediate release tablet Take 2 tablets (10 mg total) by mouth every 4 (four) hours as needed for moderate pain or severe pain. (Patient not taking: Reported on 06/29/2017) 30 tablet 0  . predniSONE (STERAPRED UNI-PAK 21 TAB) 10 MG (21) TBPK tablet Take by mouth daily. Dispense steroid taper pack as directed (Patient not taking: Reported on 04/23/2017) 21 tablet 0   No current  facility-administered medications for this visit.     Past Medical History:  Diagnosis Date  . Anxiety   . Arthritis   . Colitis   . COPD (chronic obstructive pulmonary disease) (HCC)    2.5 L O2 AT NIGHT  . Crohn disease ([place])   . Diverticulosis   . Dyspnea    OFTEN  . GERD (gastroesophageal reflux disease)   . Hypercholesteremia   . Hypertension   . Migraines   . Motion sickness   . Orthopnea    sleeps in chair  . Restless leg syndrome   . Seasonal allergies     Past Surgical History:  Procedure Laterality Date  . CATARACT EXTRACTION W/PHACO Right 07/22/2016   Procedure: CATARACT EXTRACTION PHACO AND INTRAOCULAR LENS PLACEMENT ([place]) RIGHT;  Surgeon: Leandrew Koyanagi, MD;  Location: [place];  Service: Ophthalmology;  Laterality: Right;  IVA TOPICAL RIGHT TORIC  . CATARACT EXTRACTION  W/PHACO Left 08/05/2016   Procedure: CATARACT EXTRACTION PHACO AND INTRAOCULAR LENS PLACEMENT ([place])  left toric lens;  Surgeon: Leandrew Koyanagi, MD;  Location: [place];  Service: Ophthalmology;  Laterality: Left;  Toric lens  . COLONOSCOPY WITH PROPOFOL N/A 02/28/2015   Procedure: COLONOSCOPY WITH PROPOFOL;  Surgeon: Manya Silvas, MD;  Location[facility] ENDOSCOPY;  Service: Endoscopy;  Laterality: N/A;  . ESOPHAGOGASTRODUODENOSCOPY (EGD) WITH PROPOFOL N/A 02/28/2015   Procedure: ESOPHAGOGASTRODUODENOSCOPY (EGD) WITH PROPOFOL;  Surgeon: Manya Silvas, MD;  Location: [facility] ENDOSCOPY;  Service: Endoscopy;  Laterality: N/A;  . JOINT REPLACEMENT  2002   hip  . PILONIDAL CYST EXCISION  1971  . REVISION TOTAL HIP ARTHROPLASTY  2003  . ROTATOR CUFF REPAIR  2016  . ruptured disc  1984    Social History Social History   Tobacco Use  . Smoking status: Former Smoker    Packs/day: 3.00    Years: 24.00    Pack years: 72.00    Types: Cigarettes    Last attempt to quit: 03/08/1988    Years since quitting: 29.3  . Smokeless tobacco: Never Used  Substance Use Topics  . Alcohol use: Yes    Alcohol/week: 0.0 oz    Comment: occasional  . Drug use: Not on file     Family History Family History  Problem Relation Age of Onset  . Lung disease Father   . Heart disease Father   . Lung disease Mother   . Cancer Brother        liver  . Cancer Maternal Aunt     Allergies  Allergen Reactions  . Etodolac Nausea And Vomiting  . Penicillins Other (See Comments)    Has patient had a PCN reaction causing immediate rash, facial/tongue/throat swelling, SOB or lightheadedness with hypotension: No Has patient had a PCN reaction causing severe rash involving mucus membranes or skin necrosis: No Has patient had a PCN reaction that required hospitalization: No Has patient had a PCN reaction occurring within the last 10 years: No If all of the above answers are "NO", then may proceed with  Cephalosporin use.   . Tape Rash    Paper tape caues rash    REVIEW OF SYSTEMS(Negative unless checked)  Constitutional: _0 Weight loss_1 Fever_2 Chills Cardiac:_3 Chest pain_4 Chest pressure_5 Palpitations _6 Shortness of breath when laying flat _7 Shortness of breath at rest _8 Shortness of breath with exertion. Vascular: _9 Pain in legs with walking_10 Pain in legsat rest_11 Pain in legs when laying flat _12 Claudication _13 Pain in feet when walking _14 Pain in feet at rest _15 Pain in feet when laying flat _16 History of DVT _17 Phlebitis [  x]Swelling in legs _0 Varicose veins _1 Non-healing ulcers Pulmonary: _2 Uses home oxygen _3 Productive cough_4 Hemoptysis _5 Wheeze _6 COPD _7 Asthma Neurologic: _8 Dizziness _9 Blackouts _10 Seizures _11 History of stroke _12 History of TIA_13 Aphasia _14 Temporary blindness_15 Dysphagia _16 Weaknessor numbness in arms _17 Weakness or numbnessin legs Musculoskeletal: _18 Arthritis _19 Joint swelling _20 Joint pain _21 Low back pain Hematologic:_22 Easy bruising_23 Easy bleeding _24 Hypercoagulable state _25 Anemic _26 Hepatitis Gastrointestinal:_27 Blood in stool_28 Vomiting blood_29 Gastroesophageal reflux/heartburn_30 Abdominal pain Genitourinary: _31 Chronic kidney disease _32 Difficulturination _33 Frequenturination _34 Burning with urination_35 Hematuria Skin: _36 Rashes _37 Ulcers _38 Wounds Psychological: _39 History of anxiety_40 History of major depression.    Physical Examination  BP (!) 149/71 (BP Location: Right Arm)   Pulse 81   Resp 17   Ht _41  (1.753 m)   BMI 53.72 kg/m  Gen:  WD/WN, NAD Head: [place]/AT, No temporalis wasting. Ear/Nose/Throat: Hearing grossly intact, nares w/o erythema or drainage Eyes: Conjunctiva clear. Sclera non-icteric Neck: Supple.  Trachea midline Pulmonary:  Good air movement, no use of accessory muscles.  Cardiac: RRR, no JVD Vascular:  Vessel Right Left  Radial  Palpable Palpable                                    Musculoskeletal: M/S 5/5 throughout.  No deformity or atrophy.  Trace right lower extremity edema, 1-2+ left lower extremity edema. Neurologic: Sensation grossly intact in extremities.  Symmetrical.  Speech is fluent.  Psychiatric: Judgment intact, Mood & affect appropriate for pt's clinical situation. Dermatologic: No rashes or ulcers noted.  No cellulitis or open wounds.       Labs Recent Results (from the past 2160 hour(s))  CBC with Differential     Status: Abnormal   Collection Time: 04/06/17 11:18 AM  Result Value Ref Range   WBC 7.5 3.8 - 10.6 K/uL   RBC 4.46 4.40 - 5.90 MIL/uL   Hemoglobin 12.6 (L) 13.0 - 18.0 g/dL   HCT 38.1 (L) 40.0 - 52.0 %   MCV 85.4 80.0 - 100.0 fL   MCH 28.2 26.0 - 34.0 pg   MCHC 33.0 32.0 - 36.0 g/dL   RDW 15.8 (H) 11.5 - 14.5 %   Platelets 256 150 - 440 K/uL   Neutrophils Relative % 74 %   Neutro Abs 5.6 1.4 - 6.5 K/uL   Lymphocytes Relative 10 %   Lymphs Abs 0.8 (L) 1.0 - 3.6 K/uL   Monocytes Relative 10 %   Monocytes Absolute 0.7 0.2 - 1.0 K/uL   Eosinophils Relative 5 %   Eosinophils Absolute 0.4 0 - 0.7 K/uL   Basophils Relative 1 %   Basophils Absolute 0.1 0 - 0.1 K/uL    Comment: Performed at [facility], [address] [place], [place] [address]  Basic metabolic panel     Status: Abnormal   Collection Time: 04/06/17 11:18 AM  Result Value Ref Range   Sodium 143 135 - 145 mmol/L   Potassium 3.2 (L) 3.5 - 5.1 mmol/L   Chloride 107 101 - 111 mmol/L   CO2 20 (L) 22 - 32 mmol/L   Glucose, Bld 125 (H) 65 - 99 mg/dL   BUN 30 (H) 6 - 20 mg/dL   Creatinine, Ser 1.59 (H) 0.61 - 1.24 mg/dL   Calcium 8.4 (L) 8.9 - 10.3 mg/dL   GFR calc non Af Amer 44 (L) >60 mL/min   GFR calc Af Amer 51 (L) >60 mL/min    Comment: (NOTE) The eGFR has been calculated using the CKD EPI equation. This calculation has not been validated in all clinical situations.  eGFR's persistently  <60 mL/min signify possible Chronic Kidney Disease.    Anion gap 16 (H) 5 - 15    Comment: Performed at [facility], [address] [place], [place] [address]  Brain natriuretic peptide     Status: None   Collection Time: 04/06/17 11:18 AM  Result Value Ref Range   B Natriuretic Peptide 83.0 0.0 - 100.0 pg/mL    Comment: Performed at [facility], [address] [place], [place] [address]  Troponin I     Status: None   Collection Time: 04/06/17 11:18 AM  Result Value Ref Range   Troponin I <0.03 <0.03 ng/mL    Comment: Performed at [facility], [address], [place], [place] [address]    Radiology No results found.  Assessment/Plan Hypertension blood pressure control important in reducing the progression of atherosclerotic disease. On appropriate oral medications.   Morbid obesity Worsens LE swelling, but difficult to lose weight with a very bad hip at this point.   Lower limb ulcer, calf (HCC) Previous ulcer has healed   Swelling of limb Reasonable control with conservative measures.  Continue compression stockings and elevation.  Return to clinic in 6 months  Chronic venous insufficiency Reasonable control with conservative measures.  Continue compression stockings and elevation.  Return to clinic in 6 months    Leotis Pain, MD  06/29/2017 2:35 PM    This note was created with Dragon medical transcription system.  Any errors from dictation are purely unintentional

## 2017-06-30 ENCOUNTER — Encounter: Payer: BLUE CROSS/BLUE SHIELD | Attending: Rehabilitation

## 2017-06-30 ENCOUNTER — Encounter: Payer: Self-pay | Admitting: *Deleted

## 2017-06-30 ENCOUNTER — Telehealth: Payer: Self-pay | Admitting: *Deleted

## 2017-06-30 DIAGNOSIS — Z87891 Personal history of nicotine dependence: Secondary | ICD-10-CM | POA: Insufficient documentation

## 2017-06-30 DIAGNOSIS — J449 Chronic obstructive pulmonary disease, unspecified: Secondary | ICD-10-CM | POA: Insufficient documentation

## 2017-06-30 DIAGNOSIS — Z6841 Body Mass Index (BMI) 40.0 and over, adult: Secondary | ICD-10-CM | POA: Insufficient documentation

## 2017-06-30 NOTE — Telephone Encounter (Signed)
Ernest Sims arrived early for rehab today and asked if he could just do seated equipment.  I told him that we could work on that. He is having an evaluation tomorrow for a possible redo hip replacement.  He was not able to stay for exercise today since he was wearing flip flops.  He will let us know the plan after tomorrow's visit.

## 2017-07-02 ENCOUNTER — Encounter: Payer: BLUE CROSS/BLUE SHIELD | Admitting: *Deleted

## 2017-07-02 DIAGNOSIS — Z6841 Body Mass Index (BMI) 40.0 and over, adult: Secondary | ICD-10-CM | POA: Diagnosis not present

## 2017-07-02 DIAGNOSIS — J449 Chronic obstructive pulmonary disease, unspecified: Secondary | ICD-10-CM

## 2017-07-02 DIAGNOSIS — Z87891 Personal history of nicotine dependence: Secondary | ICD-10-CM | POA: Diagnosis not present

## 2017-07-02 NOTE — Progress Notes (Signed)
Daily Session Note  Patient Details  Name: Ernest Sims MRN: [id number] Date of Birth: [birth year] Referring Provider:     Pulmonary Rehab from 06/08/2017 in [facility]  Referring Provider  Lottie Rater MD      Encounter Date: 07/02/2017  Check In: Session Check In - 07/02/17 1117      Check-In   Location  [facility]    Staff Present  Renita Papa, RN BSN;Jessica Luan Pulling, MA, RCEP, CCRP, Exercise Physiologist;Joseph Flavia Shipper    Supervising physician immediately available to respond to emergencies  [facility] immediately available ER MD    Physician(s)  Dr. Alfred Levins, and Siadecki    Medication changes reported      No    Fall or balance concerns reported     No    Tobacco Cessation  No Change    Warm-up and Cool-down  Performed as group-led instruction    Resistance Training Performed  Yes    VAD Patient?  No      Pain Assessment   Currently in Pain?  No/denies          Social History   Tobacco Use  Smoking Status Former Smoker  . Packs/day: 3.00  . Years: 24.00  . Pack years: 72.00  . Types: Cigarettes  . Last attempt to quit: 03/08/1988  . Years since quitting: 29.3  Smokeless Tobacco Never Used    Goals Met:  Proper associated with RPD/PD & O2 Sat Independence with exercise equipment Exercise tolerated well No report of cardiac concerns or symptoms Strength training completed today  Goals Unmet:  Not Applicable  Comments: Pt able to follow exercise prescription today without complaint.  Will continue to monitor for progression.  Reviewed home exercise with pt today.  Pt plans to to chair exercises at home for exercise.  Reviewed THR, pulse, RPE, sign and symptoms, NTG use, and when to call 911 or MD.  Also discussed weather considerations and indoor options.  Pt voiced understanding.     Dr. Emily Filbert is Medical Director for [facility] and [facility].

## 2017-07-05 DIAGNOSIS — J449 Chronic obstructive pulmonary disease, unspecified: Secondary | ICD-10-CM

## 2017-07-05 NOTE — Progress Notes (Signed)
Pulmonary Individual Treatment Plan  Patient Details  Name: Ernest Sims MRN: [id number] Date of Birth: 1955/08/06 Referring Provider:     Pulmonary Rehab from 06/08/2017 in [facility] Cardiac and Pulmonary Rehab  Referring Provider  Lottie Rater MD      Initial Encounter Date:    Pulmonary Rehab from 06/08/2017 in [facility] Cardiac and Pulmonary Rehab  Date  06/08/17  Referring Provider  Lottie Rater MD      Visit Diagnosis: Chronic obstructive pulmonary disease, unspecified COPD type ([place])  Patient's Home Medications on Admission:  Current Outpatient Medications:  .  albuterol (PROVENTIL HFA;VENTOLIN HFA) 108 (90 Base) MCG/ACT inhaler, Inhale 2 puffs into the lungs every 6 (six) hours as needed for wheezing or shortness of breath., Disp: 1 Inhaler, Rfl: 2 .  allopurinol (ZYLOPRIM) 300 MG tablet, Take 300 mg by mouth as needed. , Disp: , Rfl:  .  amitriptyline (ELAVIL) 25 MG tablet, Take 25 mg by mouth at bedtime., Disp: , Rfl:  .  amLODipine-olmesartan (AZOR) 5-40 MG tablet, Take 1 tablet by mouth daily. , Disp: , Rfl:  .  aspirin EC 81 MG EC tablet, Take 1 tablet (81 mg total) by mouth daily. (Patient taking differently: Take 81 mg by mouth 2 (two) times daily. ), Disp: , Rfl:  .  diazepam (VALIUM) 10 MG tablet, Take 1 tablet (10 mg total) by mouth every 8 (eight) hours as needed for anxiety., Disp: 30 tablet, Rfl: 0 .  furosemide (LASIX) 40 MG tablet, Take 40 mg by mouth daily., Disp: , Rfl:  .  latanoprost (XALATAN) 0.005 % ophthalmic solution, Apply to eye., Disp: , Rfl:  .  levothyroxine (SYNTHROID, LEVOTHROID) 50 MCG tablet, Take 50 mcg by mouth daily before breakfast. , Disp: , Rfl:  .  metaxalone (SKELAXIN) 800 MG tablet, Take 800 mg by mouth., Disp: , Rfl:  .  omeprazole (PRILOSEC) 40 MG capsule, Take 40 mg by mouth 2 (two) times daily. , Disp: , Rfl:  .  oxyCODONE (OXY IR/ROXICODONE) 5 MG immediate release tablet, Take 2 tablets (10 mg total) by mouth every 4  (four) hours as needed for moderate pain or severe pain. (Patient not taking: Reported on 06/29/2017), Disp: 30 tablet, Rfl: 0 .  oxyCODONE-acetaminophen (ROXICET) 5-325 MG tablet, Take 1 tablet by mouth every 6 (six) hours as needed for severe pain., Disp: 8 tablet, Rfl: 0 .  OXYGEN, Inhale 2.5 L into the lungs as needed., Disp: , Rfl:  .  pramipexole (MIRAPEX) 0.5 MG tablet, Take 0.5 mg by mouth 2 (two) times daily., Disp: , Rfl:  .  pravastatin (PRAVACHOL) 40 MG tablet, Take 40 mg by mouth at bedtime. , Disp: , Rfl:  .  predniSONE (STERAPRED UNI-PAK 21 TAB) 10 MG (21) TBPK tablet, Take by mouth daily. Dispense steroid taper pack as directed (Patient not taking: Reported on 04/23/2017), Disp: 21 tablet, Rfl: 0 .  propranolol (INDERAL) 40 MG tablet, Take 40 mg by mouth 2 (two) times daily., Disp: , Rfl:  .  timolol (TIMOPTIC) 0.5 % ophthalmic solution, Place 1 drop into both eyes 2 (two) times daily., Disp: , Rfl:  .  Tiotropium Bromide-Olodaterol 2.5-2.5 MCG/ACT AERS, Inhale into the lungs., Disp: , Rfl:  .  topiramate (TOPAMAX) 100 MG tablet, Take 100 mg by mouth 2 (two) times daily., Disp: , Rfl:  .  torsemide (DEMADEX) 20 MG tablet, Take 10 mg by mouth., Disp: , Rfl:  .  triamcinolone (NASACORT) 55 MCG/ACT AERO nasal inhaler, Place 2 sprays  into the nose 2 (two) times daily as needed. , Disp: , Rfl:  .  zolpidem (AMBIEN) 10 MG tablet, Take 1 tablet (10 mg total) by mouth at bedtime as needed for sleep., Disp: 10 tablet, Rfl: 0  Past Medical History: Past Medical History:  Diagnosis Date  . Anxiety   . Arthritis   . Colitis   . COPD (chronic obstructive pulmonary disease) (HCC)    2.5 L O2 AT NIGHT  . Crohn disease ([place])   . Diverticulosis   . Dyspnea    OFTEN  . GERD (gastroesophageal reflux disease)   . Hypercholesteremia   . Hypertension   . Migraines   . Motion sickness   . Orthopnea    sleeps in chair  . Restless leg syndrome   . Seasonal allergies     Tobacco Use: Social  History   Tobacco Use  Smoking Status Former Smoker  . Packs/day: 3.00  . Years: 24.00  . Pack years: 72.00  . Types: Cigarettes  . Last attempt to quit: 03/08/1988  . Years since quitting: 29.3  Smokeless Tobacco Never Used    Labs: Recent Review Scientist, physiological    Labs for ITP Cardiac and Pulmonary Rehab Latest Ref Rng & Units 04/27/2015   Cholestrol 0 - 200 mg/dL 172   LDLCALC 0 - 99 mg/dL 73   HDL >40 mg/dL 40(L)   Trlycerides <150 mg/dL 297(H)       Pulmonary Assessment Scores: Pulmonary Assessment Scores    Row Name 06/08/17 1525         ADL UCSD   ADL Phase  Entry     SOB Score total  106     Rest  4     Walk  5     Stairs  5     Bath  4     Dress  5     Shop  5       CAT Score   CAT Score  30       mMRC Score   mMRC Score  4        Pulmonary Function Assessment: Pulmonary Function Assessment - 06/08/17 1456      Breath   Bilateral Breath Sounds  --    Shortness of Breath  --       Exercise Target Goals:    Exercise Program Goal: Individual exercise prescription set using results from initial 6 min walk test and THRR while considering  patient's activity barriers and safety.    Exercise Prescription Goal: Initial exercise prescription builds to 30-45 minutes a day of aerobic activity, 2-3 days per week.  Home exercise guidelines will be given to patient during program as part of exercise prescription that the participant will acknowledge.  Activity Barriers & Risk Stratification: Activity Barriers & Cardiac Risk Stratification - 06/08/17 1601      Activity Barriers & Cardiac Risk Stratification   Activity Barriers  Arthritis;Right Hip Replacement;Muscular Weakness;Deconditioning;Shortness of Breath;Joint Problems;History of Falls;Balance Concerns R rotator cuff tear repair 3 yrs ago, THR 17 yrs ago (needs another), L shoulder occ pain       6 Minute Walk: 6 Minute Walk    Row Name 06/08/17 1557         6 Minute Walk   Phase  Initial      Distance  110 feet     Walk Time  1.23 minutes     # of Rest Breaks  2 standing rest 10 sec,  sat at 2:04 unable to get up to continue     MPH  1.01     METS  - unable to calculate, 1.77 per speed     RPE  17     Perceived Dyspnea   4     VO2 Peak  - unable to calculate (negative value)     Symptoms  Yes (comment)     Comments  using rollator for support, SOB, knee pain 10/10, overall fatigued     Resting HR  68 bpm     Resting BP  134/62     Resting Oxygen Saturation   99 %     Exercise Oxygen Saturation  during 6 min walk  91 %     Max Ex. HR  91 bpm     Max Ex. BP  148/68     2 Minute Post BP  140/60       Interval HR   1 Minute HR  91     2 Minute HR  91     3 Minute HR  82     4 Minute HR  75     5 Minute HR  76     6 Minute HR  72     2 Minute Post HR  71     Interval Heart Rate?  Yes       Interval Oxygen   Interval Oxygen?  Yes     Baseline Oxygen Saturation %  99 %     1 Minute Oxygen Saturation %  96 % standing rest 1:30-1:40     1 Minute Liters of Oxygen  0 L Room Air     2 Minute Oxygen Saturation %  94 % seated rest at 2:04 Unable to get back up     2 Minute Liters of Oxygen  0 L     3 Minute Oxygen Saturation %  91 %     3 Minute Liters of Oxygen  0 L     4 Minute Oxygen Saturation %  97 %     4 Minute Liters of Oxygen  0 L     5 Minute Oxygen Saturation %  100 %     5 Minute Liters of Oxygen  0 L     6 Minute Oxygen Saturation %  98 %     6 Minute Liters of Oxygen  0 L     2 Minute Post Oxygen Saturation %  98 %     2 Minute Post Liters of Oxygen  0 L       Oxygen Initial Assessment: Oxygen Initial Assessment - 06/08/17 1458      Home Oxygen   Home Oxygen Device  Home Concentrator;E-Tanks patient is confused about his oxygen.    Sleep Oxygen Prescription  CPAP    Liters per minute  2.5    Home Exercise Oxygen Prescription  Continuous    Liters per minute  2.5    Home at Rest Exercise Oxygen Prescription  Continuous    Liters per minute  2.5     Compliance with Home Oxygen Use  Yes states he uses it at home becuase it makes him feel better      Initial 6 min Walk   Oxygen Used  None      Program Oxygen Prescription   Program Oxygen Prescription  None      Intervention   Short Term Goals  To learn and exhibit compliance  with exercise, home and travel O2 prescription;To learn and understand importance of maintaining oxygen saturations>88%;To learn and demonstrate proper use of respiratory medications;To learn and demonstrate proper pursed lip breathing techniques or other breathing techniques.;To learn and understand importance of monitoring SPO2 with pulse oximeter and demonstrate accurate use of the pulse oximeter.    Long  Term Goals  Exhibits compliance with exercise, home and travel O2 prescription;Verbalizes importance of monitoring SPO2 with pulse oximeter and return demonstration;Maintenance of O2 saturations>88%;Exhibits proper breathing techniques, such as pursed lip breathing or other method taught during program session;Compliance with respiratory medication;Demonstrates proper use of MDI's       Oxygen Re-Evaluation: Oxygen Re-Evaluation    Row Name 06/23/17 1131             Goals/Expected Outcomes   Short Term Goals  To learn and understand importance of maintaining oxygen saturations>88%;To learn and understand importance of monitoring SPO2 with pulse oximeter and demonstrate accurate use of the pulse oximeter.;To learn and demonstrate proper pursed lip breathing techniques or other breathing techniques.       Long  Term Goals  Verbalizes importance of monitoring SPO2 with pulse oximeter and return demonstration;Maintenance of O2 saturations>88%;Exhibits proper breathing techniques, such as pursed lip breathing or other method taught during program session       Comments  Reviewed PLB technique with pt.  Talked about how it work and it's important to maintaining his exercise saturations.         Goals/Expected  Outcomes  Short: Become more profiecient at using PLB.   Long: Become independent at using PLB.          Oxygen Discharge (Final Oxygen Re-Evaluation): Oxygen Re-Evaluation - 06/23/17 1131      Goals/Expected Outcomes   Short Term Goals  To learn and understand importance of maintaining oxygen saturations>88%;To learn and understand importance of monitoring SPO2 with pulse oximeter and demonstrate accurate use of the pulse oximeter.;To learn and demonstrate proper pursed lip breathing techniques or other breathing techniques.    Long  Term Goals  Verbalizes importance of monitoring SPO2 with pulse oximeter and return demonstration;Maintenance of O2 saturations>88%;Exhibits proper breathing techniques, such as pursed lip breathing or other method taught during program session    Comments  Reviewed PLB technique with pt.  Talked about how it work and it's important to maintaining his exercise saturations.      Goals/Expected Outcomes  Short: Become more profiecient at using PLB.   Long: Become independent at using PLB.       Initial Exercise Prescription: Initial Exercise Prescription - 06/08/17 1600      Date of Initial Exercise RX and Referring Provider   Date  06/08/17    Referring Provider  Lottie Rater MD      Treadmill   MPH  0.5    Grade  0    Minutes  15    METs  1.4      NuStep   Level  1    SPM  80    Minutes  15    METs  1.4      Arm Ergometer   Level  1    RPM  50    Minutes  15    METs  1.4      Prescription Details   Frequency (times per week)  3    Duration  Progress to 45 minutes of aerobic exercise without signs/symptoms of physical distress      Intensity  THRR 40-80% of Max Heartrate  103-138    Ratings of Perceived Exertion  11-13    Perceived Dyspnea  0-4      Progression   Progression  Continue to progress workloads to maintain intensity without signs/symptoms of physical distress.      Resistance Training   Training Prescription   Yes    Weight  3 lbs    Reps  10-15       Perform Capillary Blood Glucose checks as needed.  Exercise Prescription Changes: Exercise Prescription Changes    Row Name 06/08/17 1600 06/23/17 1400 07/02/17 1200         Response to Exercise   Blood Pressure (Admit)  134/62  134/74  -     Blood Pressure (Exercise)  148/68  148/80  -     Blood Pressure (Exit)  140/60  112/60  -     Heart Rate (Admit)  68 bpm  87 bpm  -     Heart Rate (Exercise)  91 bpm  104 bpm  -     Heart Rate (Exit)  71 bpm  77 bpm  -     Oxygen Saturation (Admit)  99 %  94 %  -     Oxygen Saturation (Exercise)  91 %  93 %  -     Oxygen Saturation (Exit)  98 %  94 %  -     Rating of Perceived Exertion (Exercise)  17  17  -     Perceived Dyspnea (Exercise)  4  4  -     Symptoms  SOB, knee pain 10/10, overall fatigued, used rollator  SOB, knee pain  -     Comments  walk test results  first full day of exercise  -     Duration  -  Progress to 45 minutes of aerobic exercise without signs/symptoms of physical distress  -     Intensity  -  THRR unchanged  -       Progression   Progression  -  Continue to progress workloads to maintain intensity without signs/symptoms of physical distress.  -     Average METs  -  1.72  -       Resistance Training   Training Prescription  -  Yes  -     Weight  -  3 lbs  -     Reps  -  10-15  -       Interval Training   Interval Training  -  No  -       Treadmill   MPH  -  1.1  -     Grade  -  0  -     Minutes  -  15  -     METs  -  1.84  -       NuStep   Level  -  1  -     SPM  -  67  -     Minutes  -  15  -     METs  -  1.6  -       Home Exercise Plan   Plans to continue exercise at  -  -  Home (comment) chair exercises     Frequency  -  -  Add 2 additional days to program exercise sessions.     Initial Home Exercises Provided  -  -  07/02/17  Exercise Comments: Exercise Comments    Row Name 06/23/17 1131           Exercise Comments   First full day of  exercise!  Patient was oriented to gym and equipment including functions, settings, policies, and procedures.  Patient's individual exercise prescription and treatment plan were reviewed.  All starting workloads were established based on the results of the 6 minute walk test done at initial orientation visit.  The plan for exercise progression was also introduced and progression will be customized based on patient's performance and goals.          Exercise Goals and Review: Exercise Goals    Row Name 06/08/17 1608             Exercise Goals   Increase Physical Activity  Yes       Intervention  Provide advice, education, support and counseling about physical activity/exercise needs.;Develop an individualized exercise prescription for aerobic and resistive training based on initial evaluation findings, risk stratification, comorbidities and participant's personal goals.       Expected Outcomes  Short Term: Attend rehab on a regular basis to increase amount of physical activity.;Long Term: Add in home exercise to make exercise part of routine and to increase amount of physical activity.;Long Term: Exercising regularly at least 3-5 days a week.       Increase Strength and Stamina  Yes       Intervention  Provide advice, education, support and counseling about physical activity/exercise needs.;Develop an individualized exercise prescription for aerobic and resistive training based on initial evaluation findings, risk stratification, comorbidities and participant's personal goals.       Expected Outcomes  Short Term: Perform resistance training exercises routinely during rehab and add in resistance training at home;Long Term: Improve cardiorespiratory fitness, muscular endurance and strength as measured by increased METs and functional capacity (6MWT);Short Term: Increase workloads from initial exercise prescription for resistance, speed, and METs.       Able to understand and use rate of perceived  exertion (RPE) scale  Yes       Intervention  Provide education and explanation on how to use RPE scale       Expected Outcomes  Short Term: Able to use RPE daily in rehab to express subjective intensity level;Long Term:  Able to use RPE to guide intensity level when exercising independently       Able to understand and use Dyspnea scale  Yes       Intervention  Provide education and explanation on how to use Dyspnea scale       Expected Outcomes  Short Term: Able to use Dyspnea scale daily in rehab to express subjective sense of shortness of breath during exertion;Long Term: Able to use Dyspnea scale to guide intensity level when exercising independently       Knowledge and understanding of Target Heart Rate Range (THRR)  Yes       Intervention  Provide education and explanation of THRR including how the numbers were predicted and where they are located for reference       Expected Outcomes  Short Term: Able to state/look up THRR;Short Term: Able to use daily as guideline for intensity in rehab;Long Term: Able to use THRR to govern intensity when exercising independently       Able to check pulse independently  Yes       Intervention  Provide education and demonstration on how to check pulse in carotid and radial arteries.;Review the  importance of being able to check your own pulse for safety during independent exercise       Expected Outcomes  Short Term: Able to explain why pulse checking is important during independent exercise;Long Term: Able to check pulse independently and accurately       Understanding of Exercise Prescription  Yes       Intervention  Provide education, explanation, and written materials on patient's individual exercise prescription       Expected Outcomes  Short Term: Able to explain program exercise prescription;Long Term: Able to explain home exercise prescription to exercise independently          Exercise Goals Re-Evaluation : Exercise Goals Re-Evaluation    Row Name  06/23/17 1131 07/02/17 1230           Exercise Goal Re-Evaluation   Exercise Goals Review  Increase Physical Activity;Increase Strength and Stamina;Understanding of Exercise Prescription;Knowledge and understanding of Target Heart Rate Range (THRR);Able to understand and use rate of perceived exertion (RPE) scale  Increase Physical Activity;Able to understand and use Dyspnea scale;Understanding of Exercise Prescription;Increase Strength and Stamina;Knowledge and understanding of Target Heart Rate Range (THRR);Able to understand and use rate of perceived exertion (RPE) scale;Able to check pulse independently      Comments  Reviewed RPE scale, THR and program prescription with pt today.  Pt voiced understanding and was given a copy of goals to take home.   Reviewed home exercise with pt today.  Pt plans to do chair exercises at home for exercise.  Reviewed THR, pulse, RPE, sign and symptoms, NTG use, and when to call 911 or MD.  Also discussed weather considerations and indoor options.  Pt voiced understanding.      Expected Outcomes  Short: Use RPE daily to regulate intensity.  Long: Follow program prescription in THR.  Short: Start with 2 days for 36mn each.  Long: Continue to exercise independently         Discharge Exercise Prescription (Final Exercise Prescription Changes): Exercise Prescription Changes - 07/02/17 1200      Home Exercise Plan   Plans to continue exercise at  Home (comment) chair exercises    Frequency  Add 2 additional days to program exercise sessions.    Initial Home Exercises Provided  07/02/17       Nutrition:  Target Goals: Understanding of nutrition guidelines, daily intake of sodium <1503m cholesterol <20045mcalories 30% from fat and 7% or less from saturated fats, daily to have 5 or more servings of fruits and vegetables.  Biometrics: Pre Biometrics - 06/08/17 1609      Pre Biometrics   Height  5' 7.6" (1.717 m)    Weight  363 lb 12.8 oz (165 kg)   (Abnormal)     Waist Circumference  52 inches    Hip Circumference  64 inches    Waist to Hip Ratio  0.81 %    BMI (Calculated)  55.97        Nutrition Therapy Plan and Nutrition Goals: Nutrition Therapy & Goals - 06/08/17 1455      Personal Nutrition Goals   Comments  Wants to learn how to eat better. Weight loss       Intervention Plan   Intervention  Prescribe, educate and counsel regarding individualized specific dietary modifications aiming towards targeted core components such as weight, hypertension, lipid management, diabetes, heart failure and other comorbidities.;Nutrition handout(s) given to patient.    Expected Outcomes  Short Term Goal: Understand basic principles of  dietary content, such as calories, fat, sodium, cholesterol and nutrients.;Long Term Goal: Adherence to prescribed nutrition plan.       Nutrition Assessments: Nutrition Assessments - 06/23/17 1456      MEDFICTS Scores   Pre Score  105       Nutrition Goals Re-Evaluation:   Nutrition Goals Discharge (Final Nutrition Goals Re-Evaluation):   Psychosocial: Target Goals: Acknowledge presence or absence of significant depression and/or stress, maximize coping skills, provide positive support system. Participant is able to verbalize types and ability to use techniques and skills needed for reducing stress and depression.   Initial Review & Psychosocial Screening: Initial Psych Review & Screening - 06/08/17 1453      Initial Review   Current issues with  Current Sleep Concerns;Current Stress Concerns    Source of Stress Concerns  Chronic Illness    Comments  His breathing stresses him out.      Family Dynamics   Good Support System?  Yes    Comments  He can look to his wife and daughter for support      Barriers   Psychosocial barriers to participate in program  The patient should benefit from training in stress management and relaxation.      Screening Interventions   Interventions  Encouraged  to exercise;Provide feedback about the scores to participant;Program counselor consult;To provide support and resources with identified psychosocial needs    Expected Outcomes  Short Term goal: Utilizing psychosocial counselor, staff and physician to assist with identification of specific Stressors or current issues interfering with healing process. Setting desired goal for each stressor or current issue identified.;Long Term Goal: Stressors or current issues are controlled or eliminated.;Long Term goal: The participant improves quality of Life and PHQ9 Scores as seen by post scores and/or verbalization of changes;Short Term goal: Identification and review with participant of any Quality of Life or Depression concerns found by scoring the questionnaire.       Quality of Life Scores:  Scores of 19 and below usually indicate a poorer quality of life in these areas.  A difference of  2-3 points is a clinically meaningful difference.  A difference of 2-3 points in the total score of the Quality of Life Index has been associated with significant improvement in overall quality of life, self-image, physical symptoms, and general health in studies assessing change in quality of life.  PHQ-9: Recent Review Flowsheet Data    Depression screen [employer] [employer] 2/9 06/08/2017   Decreased Interest 1   Down, Depressed, Hopeless 1   PHQ - 2 Score 2   Altered sleeping 1   Tired, decreased energy 1   Change in appetite 2   Feeling bad or failure about yourself  0   Trouble concentrating 0   Moving slowly or fidgety/restless 1   Suicidal thoughts 0   PHQ-9 Score 7   Difficult doing work/chores Somewhat difficult     Interpretation of Total Score  Total Score Depression Severity:  1-4 = Minimal depression, 5-9 = Mild depression, 10-14 = Moderate depression, 15-19 = Moderately severe depression, 20-27 = Severe depression   Psychosocial Evaluation and Intervention:   Psychosocial Re-Evaluation:   Psychosocial  Discharge (Final Psychosocial Re-Evaluation):   Education: Education Goals: Education classes will be provided on a weekly basis, covering required topics. Participant will state understanding/return demonstration of topics presented.  Learning Barriers/Preferences: Learning Barriers/Preferences - 06/08/17 1457      Learning Barriers/Preferences   Learning Barriers  Sight wears glasses  Learning Preferences  None       Education Topics:  Initial Evaluation Education: - Verbal, written and demonstration of respiratory meds, oximetry and breathing techniques. Instruction on use of nebulizers and MDIs and importance of monitoring MDI activations.   Pulmonary Rehab from 06/25/2017 in [facility] Cardiac and Pulmonary Rehab  Date  06/08/17  Educator  [facility]  Instruction Review Code  1- Verbalizes Understanding      General Nutrition Guidelines/Fats and Fiber: -Group instruction provided by verbal, written material, models and posters to present the general guidelines for heart healthy nutrition. Gives an explanation and review of dietary fats and fiber.   Controlling Sodium/Reading Food Labels: -Group verbal and written material supporting the discussion of sodium use in heart healthy nutrition. Review and explanation with models, verbal and written materials for utilization of the food label.   Exercise Physiology & General Exercise Guidelines: - Group verbal and written instruction with models to review the exercise physiology of the cardiovascular system and associated critical values. Provides general exercise guidelines with specific guidelines to those with heart or lung disease.    Aerobic Exercise & Resistance Training: - Gives group verbal and written instruction on the various components of exercise. Focuses on aerobic and resistive training programs and the benefits of this training and how to safely progress through these programs.   Pulmonary Rehab from 06/25/2017 in [facility] Cardiac  and Pulmonary Rehab  Date  06/25/17  Educator  [facility]  Instruction Review Code  1- Verbalizes Understanding      Flexibility, Balance, Mind/Body Relaxation: Provides group verbal/written instruction on the benefits of flexibility and balance training, including mind/body exercise modes such as yoga, pilates and tai chi.  Demonstration and skill practice provided.   Stress and Anxiety: - Provides group verbal and written instruction about the health risks of elevated stress and causes of high stress.  Discuss the correlation between heart/lung disease and anxiety and treatment options. Review healthy ways to manage with stress and anxiety.   Depression: - Provides group verbal and written instruction on the correlation between heart/lung disease and depressed mood, treatment options, and the stigmas associated with seeking treatment.   Exercise & Equipment Safety: - Individual verbal instruction and demonstration of equipment use and safety with use of the equipment.   Pulmonary Rehab from 06/25/2017 in [facility] Cardiac and Pulmonary Rehab  Date  06/08/17  Educator  [facility]  Instruction Review Code  1- Verbalizes Understanding      Infection Prevention: - Provides verbal and written material to individual with discussion of infection control including proper hand washing and proper equipment cleaning during exercise session.   Pulmonary Rehab from 06/25/2017 in [facility] Cardiac and Pulmonary Rehab  Date  06/08/17  Educator  [facility]  Instruction Review Code  1- Verbalizes Understanding      Falls Prevention: - Provides verbal and written material to individual with discussion of falls prevention and safety.   Pulmonary Rehab from 06/25/2017 in [facility] Cardiac and Pulmonary Rehab  Date  06/08/17  Educator  [facility]  Instruction Review Code  1- Verbalizes Understanding      Diabetes: - Individual verbal and written instruction to review signs/symptoms of diabetes, desired ranges of glucose level fasting,  after meals and with exercise. Advice that pre and post exercise glucose checks will be done for 3 sessions at entry of program.   Chronic Lung Diseases: - Group verbal and written instruction to review updates, respiratory medications, advancements in procedures and treatments. Discuss use of supplemental  oxygen including available portable oxygen systems, continuous and intermittent flow rates, concentrators, personal use and safety guidelines. Review proper use of inhaler and spacers. Provide informative websites for self-education.    Energy Conservation: - Provide group verbal and written instruction for methods to conserve energy, plan and organize activities. Instruct on pacing techniques, use of adaptive equipment and posture/positioning to relieve shortness of breath.   Triggers and Exacerbations: - Group verbal and written instruction to review types of environmental triggers and ways to prevent exacerbations. Discuss weather changes, air quality and the benefits of nasal washing. Review warning signs and symptoms to help prevent infections. Discuss techniques for effective airway clearance, coughing, and vibrations.   Pulmonary Rehab from 06/25/2017 in [facility] Cardiac and Pulmonary Rehab  Date  06/23/17  Educator  [facility]  Instruction Review Code  3- Needs Reinforcement [late arrival]      AED/CPR: - Group verbal and written instruction with the use of models to demonstrate the basic use of the AED with the basic ABC's of resuscitation.   Anatomy and Physiology of the Lungs: - Group verbal and written instruction with the use of models to provide basic lung anatomy and physiology related to function, structure and complications of lung disease.   Anatomy & Physiology of the Heart: - Group verbal and written instruction and models provide basic cardiac anatomy and physiology, with the coronary electrical and arterial systems. Review of Valvular disease and Heart Failure   Cardiac  Medications: - Group verbal and written instruction to review commonly prescribed medications for heart disease. Reviews the medication, class of the drug, and side effects.   Know Your Numbers and Risk Factors: -Group verbal and written instruction about important numbers in your health.  Discussion of what are risk factors and how they play a role in the disease process.  Review of Cholesterol, Blood Pressure, Diabetes, and BMI and the role they play in your overall health.   Sleep Hygiene: -Provides group verbal and written instruction about how sleep can affect your health.  Define sleep hygiene, discuss sleep cycles and impact of sleep habits. Review good sleep hygiene tips.    Other: -Provides group and verbal instruction on various topics (see comments)    Knowledge Questionnaire Score: Knowledge Questionnaire Score - 06/08/17 1457      Knowledge Questionnaire Score   Pre Score  13/18 reviewed with patient        Core Components/Risk Factors/Patient Goals at Admission: Personal Goals and Risk Factors at Admission - 06/08/17 1500      Core Components/Risk Factors/Patient Goals on Admission    Weight Management  Yes;Obesity;Weight Loss    Intervention  Weight Management: Develop a combined nutrition and exercise program designed to reach desired caloric intake, while maintaining appropriate intake of nutrient and fiber, sodium and fats, and appropriate energy expenditure required for the weight goal.;Weight Management: Provide education and appropriate resources to help participant work on and attain dietary goals.;Obesity: Provide education and appropriate resources to help participant work on and attain dietary goals.;Weight Management/Obesity: Establish reasonable short term and long term weight goals.    Admit Weight  363 lb 12.8 oz (165 kg)    Goal Weight: Short Term  355 lb (161 kg)    Goal Weight: Long Term  260 lb (117.9 kg)    Expected Outcomes  Short Term: Continue to  assess and modify interventions until short term weight is achieved;Long Term: Adherence to nutrition and physical activity/exercise program aimed toward attainment of established weight  goal;Weight Maintenance: Understanding of the daily nutrition guidelines, which includes 25-35% calories from fat, 7% or less cal from saturated fats, less than 257m cholesterol, less than 1.5gm of sodium, & 5 or more servings of fruits and vegetables daily;Understanding recommendations for meals to include 15-35% energy as protein, 25-35% energy from fat, 35-60% energy from carbohydrates, less than 2086mof dietary cholesterol, 20-35 gm of total fiber daily;Weight Loss: Understanding of general recommendations for a balanced deficit meal plan, which promotes 1-2 lb weight loss per week and includes a negative energy balance of [phone] kcal/d;Understanding of distribution of calorie intake throughout the day with the consumption of 4-5 meals/snacks    Improve shortness of breath with ADL's  Yes    Intervention  Provide education, individualized exercise plan and daily activity instruction to help decrease symptoms of SOB with activities of daily living.    Expected Outcomes  Short Term: Improve cardiorespiratory fitness to achieve a reduction of symptoms when performing ADLs;Long Term: Be able to perform more ADLs without symptoms or delay the onset of symptoms    Hypertension  Yes    Intervention  Provide education on lifestyle modifcations including regular physical activity/exercise, weight management, moderate sodium restriction and increased consumption of fresh fruit, vegetables, and low fat dairy, alcohol moderation, and smoking cessation.;Monitor prescription use compliance.    Expected Outcomes  Short Term: Continued assessment and intervention until BP is < 140/90101mG in hypertensive participants. < 130/15m70m in hypertensive participants with diabetes, heart failure or chronic kidney disease.;Long Term:  Maintenance of blood pressure at goal levels.    Lipids  Yes    Intervention  Provide education and support for participant on nutrition & aerobic/resistive exercise along with prescribed medications to achieve LDL <70mg27mL >40mg.39mExpected Outcomes  Short Term: Participant states understanding of desired cholesterol values and is compliant with medications prescribed. Participant is following exercise prescription and nutrition guidelines.;Long Term: Cholesterol controlled with medications as prescribed, with individualized exercise RX and with personalized nutrition plan. Value goals: LDL < 70mg, 50m> 40 mg.       Core Components/Risk Factors/Patient Goals Review:    Core Components/Risk Factors/Patient Goals at Discharge (Final Review):    ITP Comments: ITP Comments    Row Name 06/08/17 1419 06/30/17 1540 07/05/17 0854       ITP Comments  Medical Evaluation completed. Chart sent for review and changes to Dr. Mark MiEmily Filbertor of [facility]  Doug arrived early for rehab today and asked if he could just do seated equipment.  I told him that we could work on that. He is having an evaluation tomorrow for a possible redo hip replacement.  He was not able to stay for exercise today since he was wearing flip flops.  He will let us knowK[place]he plan after tomorrow's visit.    30 day review completed. ITP sent to Dr. Mark MiEmily Filbertor of [facility] with ITP unless changes are made by physician        Comments: 30 day review

## 2017-07-07 ENCOUNTER — Encounter: Payer: BLUE CROSS/BLUE SHIELD | Admitting: *Deleted

## 2017-07-07 DIAGNOSIS — J449 Chronic obstructive pulmonary disease, unspecified: Secondary | ICD-10-CM

## 2017-07-07 NOTE — Progress Notes (Signed)
Daily Session Note  Patient Details  Name: Ernest Sims MRN: [id number] Date of Birth: 1956/01/22 Referring Provider:     Pulmonary Rehab from 06/08/2017 in [facility] Cardiac and Pulmonary Rehab  Referring Provider  Lottie Rater MD      Encounter Date: 07/07/2017  Check In: Session Check In - 07/07/17 1120      Check-In   Location  [facility]-Cardiac & Pulmonary Rehab    Staff Present  Renita Papa, RN BSN;Jessica Luan Pulling, MA, RCEP, CCRP, Exercise Physiologist;Joseph Flavia Shipper    Supervising physician immediately available to respond to emergencies  [facility] immediately available ER MD    Physician(s)  Dr. Jimmye Norman and Burlene Arnt    Medication changes reported      No    Fall or balance concerns reported     No    Tobacco Cessation  No Change    Warm-up and Cool-down  Performed as group-led instruction    Resistance Training Performed  Yes    VAD Patient?  No      Pain Assessment   Currently in Pain?  No/denies        Exercise Prescription Changes - 07/06/17 1600      Response to Exercise   Blood Pressure (Admit)  134/70    Blood Pressure (Exercise)  132/76    Blood Pressure (Exit)  129/64    Heart Rate (Admit)  66 bpm    Heart Rate (Exercise)  82 bpm    Heart Rate (Exit)  69 bpm    Oxygen Saturation (Admit)  100 %    Oxygen Saturation (Exercise)  96 %    Oxygen Saturation (Exit)  99 %    Rating of Perceived Exertion (Exercise)  13    Perceived Dyspnea (Exercise)  2    Symptoms  SOB, knee pain, hip pain    Duration  Progress to 45 minutes of aerobic exercise without signs/symptoms of physical distress    Intensity  THRR unchanged      Progression   Progression  Continue to progress workloads to maintain intensity without signs/symptoms of physical distress.    Average METs  2      Resistance Training   Training Prescription  Yes    Weight  3 lbs    Reps  10-15      Interval Training   Interval Training  No      NuStep   Level  2    SPM  70    Minutes  15    METs  1.9      Arm Ergometer   Level  1    RPM  41    Minutes  15    METs  2.1      Home Exercise Plan   Plans to continue exercise at  Home (comment) chair exercises    Frequency  Add 2 additional days to program exercise sessions.    Initial Home Exercises Provided  07/02/17       Social History   Tobacco Use  Smoking Status Former Smoker  . Packs/day: 3.00  . Years: 24.00  . Pack years: 72.00  . Types: Cigarettes  . Last attempt to quit: 03/08/1988  . Years since quitting: 29.3  Smokeless Tobacco Never Used    Goals Met:  Proper associated with RPD/PD & O2 Sat Independence with exercise equipment Using PLB without cueing & demonstrates good technique Exercise tolerated well No report of cardiac concerns or symptoms Strength training completed today  Goals  Unmet:  Not Applicable  Comments: Pt able to follow exercise prescription today without complaint.  Will continue to monitor for progression.    Dr. Emily Filbert is Medical Director for [place] and [facility].

## 2017-07-14 ENCOUNTER — Encounter: Payer: BLUE CROSS/BLUE SHIELD | Admitting: *Deleted

## 2017-07-14 DIAGNOSIS — J449 Chronic obstructive pulmonary disease, unspecified: Secondary | ICD-10-CM | POA: Diagnosis not present

## 2017-07-14 NOTE — Progress Notes (Signed)
Daily Session Note  Patient Details  Name: Ernest Sims MRN: [id number] Date of Birth: September 02, 1955 Referring Provider:     Pulmonary Rehab from 06/08/2017 in [facility] Cardiac and Pulmonary Rehab  Referring Provider  Lottie Rater MD      Encounter Date: 07/14/2017  Check In: Session Check In - 07/14/17 1117      Check-In   Location  [facility]-Cardiac & Pulmonary Rehab    Staff Present  Alberteen Sam, MA, RCEP, CCRP, Exercise Physiologist;Meredith [place], RN BSN;Mary Kellie Shropshire, RN, BSN, MA    Supervising physician immediately available to respond to emergencies  [facility] immediately available ER MD    Physician(s)  Dr. Quentin Cornwall and Jimmye Norman    Medication changes reported      No    Fall or balance concerns reported     No    Tobacco Cessation  No Change    Warm-up and Cool-down  Performed as group-led instruction    Resistance Training Performed  Yes    VAD Patient?  No      Pain Assessment   Currently in Pain?  No/denies          Social History   Tobacco Use  Smoking Status Former Smoker  . Packs/day: 3.00  . Years: 24.00  . Pack years: 72.00  . Types: Cigarettes  . Last attempt to quit: 03/08/1988  . Years since quitting: 29.3  Smokeless Tobacco Never Used    Goals Met:  Proper associated with RPD/PD & O2 Sat Independence with exercise equipment Using PLB without cueing & demonstrates good technique Exercise tolerated well Personal goals reviewed No report of cardiac concerns or symptoms Strength training completed today  Goals Unmet:  Not Applicable  Comments: Pt able to follow exercise prescription today without complaint.  Will continue to monitor for progression. See ITP for goal review   Dr. Emily Filbert is Medical Director for [place] and [facility].

## 2017-07-16 ENCOUNTER — Encounter: Payer: BLUE CROSS/BLUE SHIELD | Admitting: *Deleted

## 2017-07-16 DIAGNOSIS — J449 Chronic obstructive pulmonary disease, unspecified: Secondary | ICD-10-CM | POA: Diagnosis not present

## 2017-07-16 NOTE — Progress Notes (Signed)
Daily Session Note  Patient Details  Name: Ernest Sims MRN: [id number] Date of Birth: September 06, 1955 Referring Provider:     Pulmonary Rehab from 06/08/2017 in [facility] Cardiac and Pulmonary Rehab  Referring Provider  Lottie Rater MD      Encounter Date: 07/16/2017  Check In: Session Check In - 07/16/17 1221      Check-In   Location  [facility]-Cardiac & Pulmonary Rehab    Staff Present  Carson Myrtle, BS, RRT, Respiratory Lennie Hummer, MA, RCEP, CCRP, Exercise Physiologist;Meredith Sherryll Burger, RN BSN    Supervising physician immediately available to respond to emergencies  [facility] immediately available ER MD    Physician(s)  Dr. Joni Fears and Corky Downs    Medication changes reported      No    Fall or balance concerns reported     No    Tobacco Cessation  No Change    Warm-up and Cool-down  Performed as group-led instruction    Resistance Training Performed  Yes    VAD Patient?  No      Pain Assessment   Currently in Pain?  No/denies          Social History   Tobacco Use  Smoking Status Former Smoker  . Packs/day: 3.00  . Years: 24.00  . Pack years: 72.00  . Types: Cigarettes  . Last attempt to quit: 03/08/1988  . Years since quitting: 29.3  Smokeless Tobacco Never Used    Goals Met:  Proper associated with RPD/PD & O2 Sat Independence with exercise equipment Using PLB without cueing & demonstrates good technique Exercise tolerated well No report of cardiac concerns or symptoms Strength training completed today  Goals Unmet:  Not Applicable  Comments: Pt able to follow exercise prescription today without complaint.  Will continue to monitor for progression.    Dr. Emily Filbert is Medical Director for [place] and [facility].

## 2017-07-18 IMAGING — US US RENAL
1 series · 14 of 25 positions shown · non-contrast
Comparison: None.

CLINICAL DATA: Chronic renal disease

EXAM:
RENAL / URINARY TRACT ULTRASOUND COMPLETE

[Series 1: us renal · 0.30mm/px · 14 of 28 slices shown]
[im 1/28]
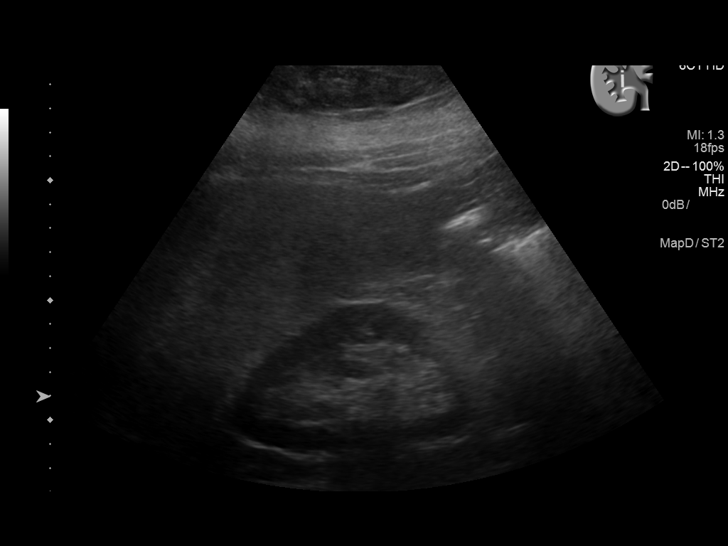
[im 3/28]
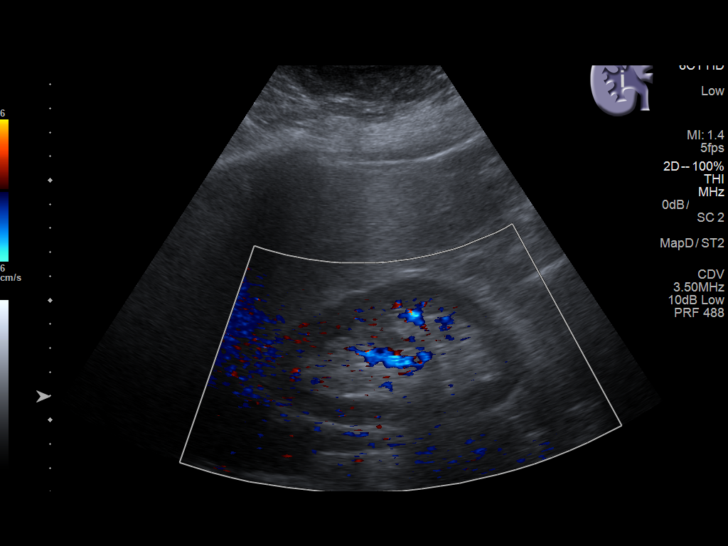
[im 5/28]
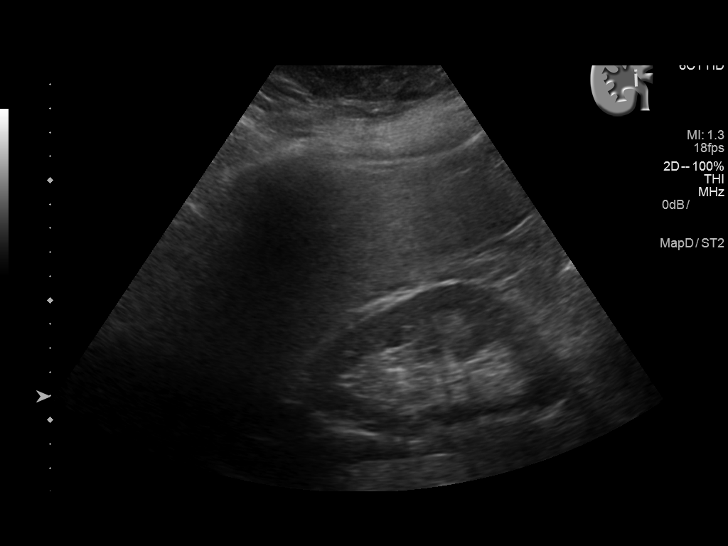
[im 7/28]
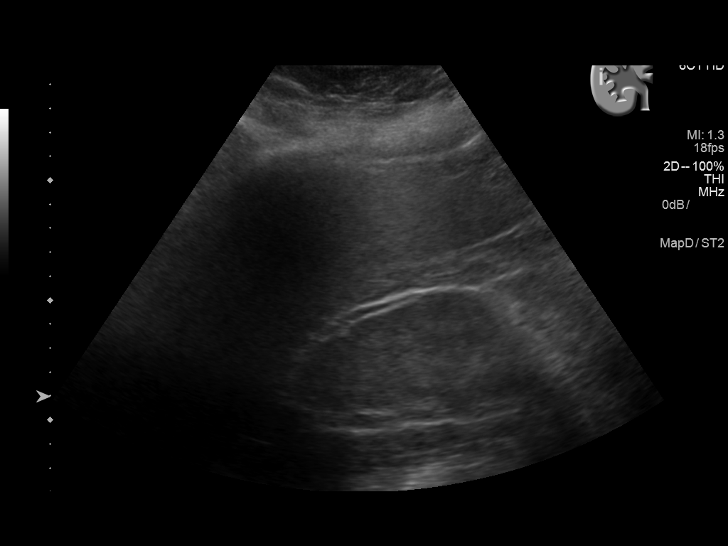
[im 10/28]
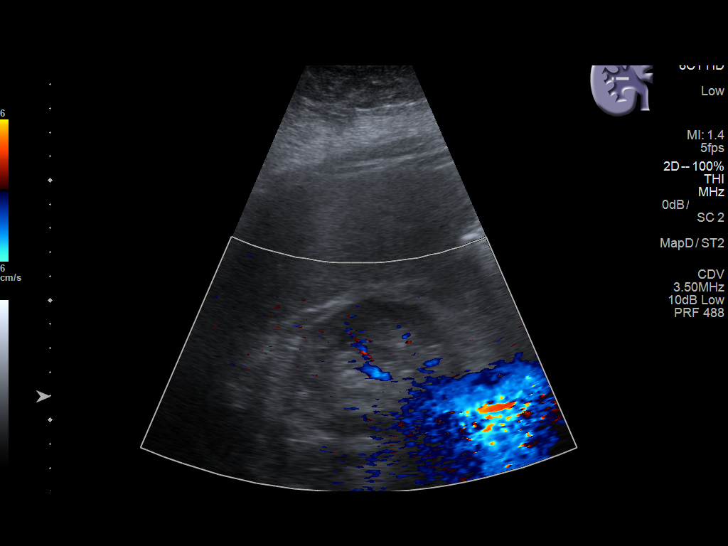
[im 11/28]
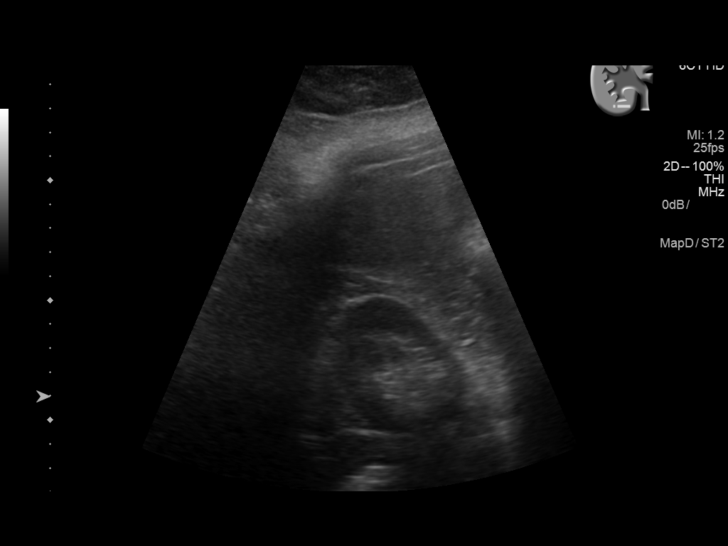
[im 13/28]
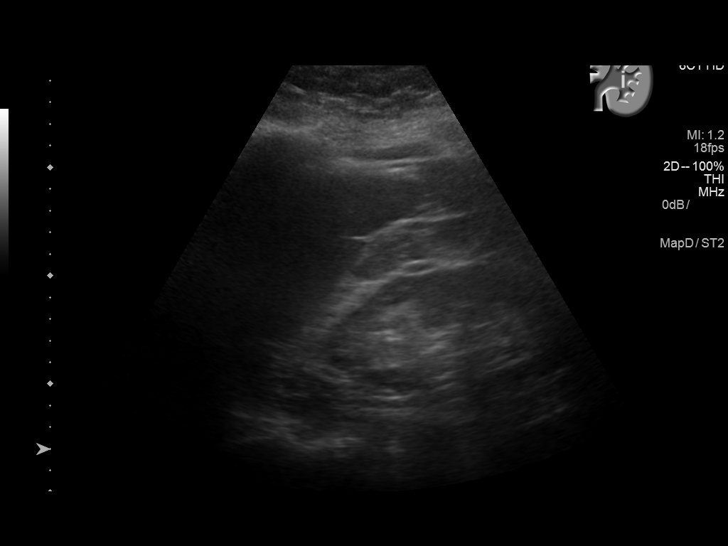
[im 15/28]
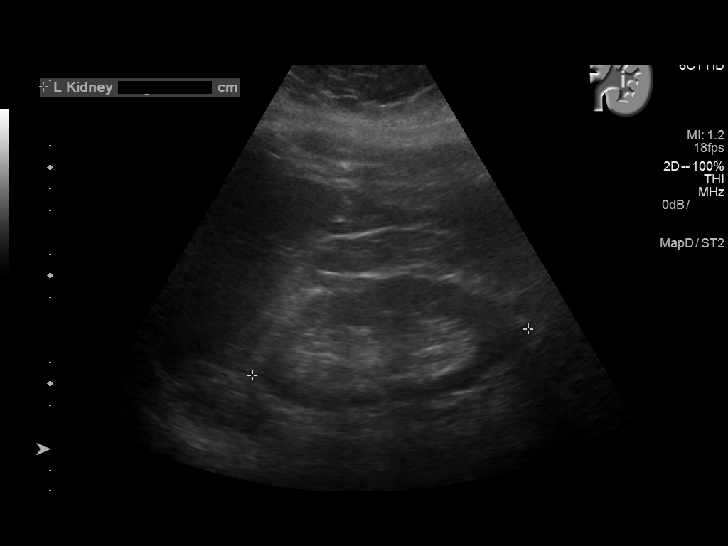
[im 17/28]
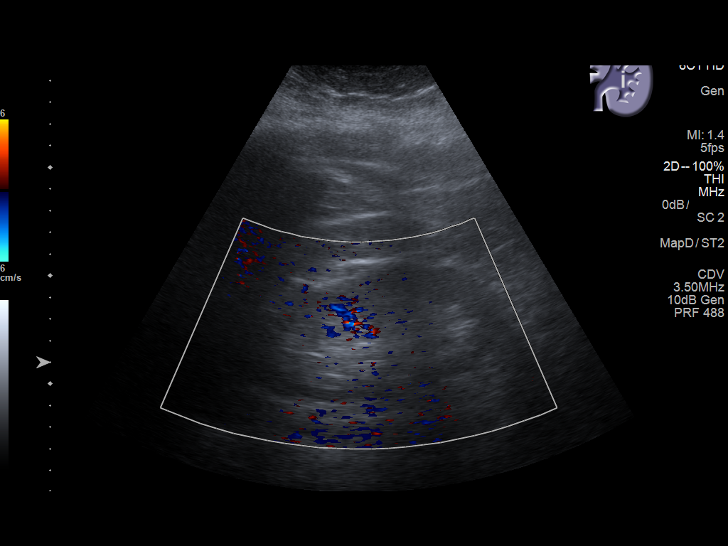
[im 19/28]
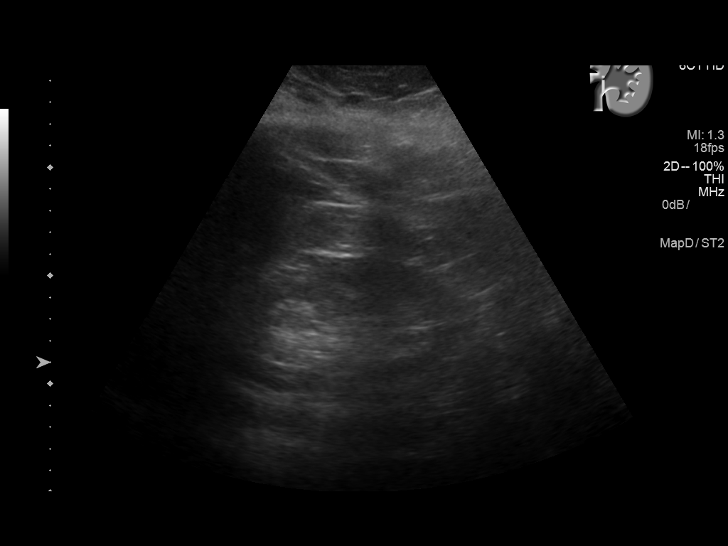
[im 21/28]
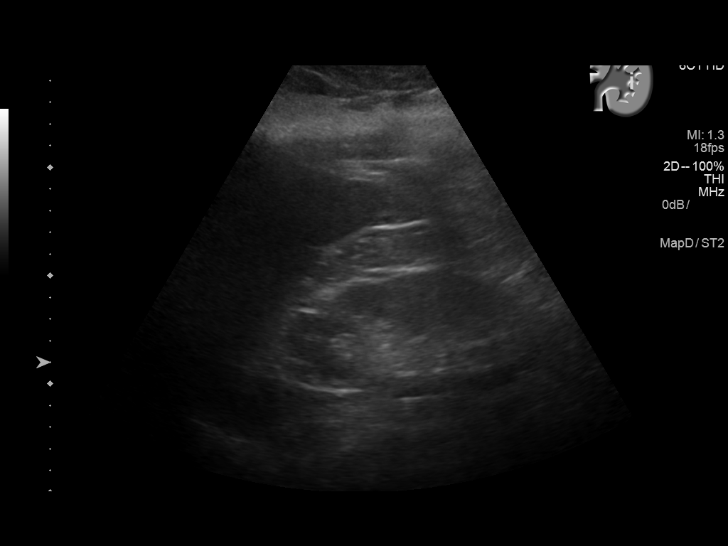
[im 23/28]
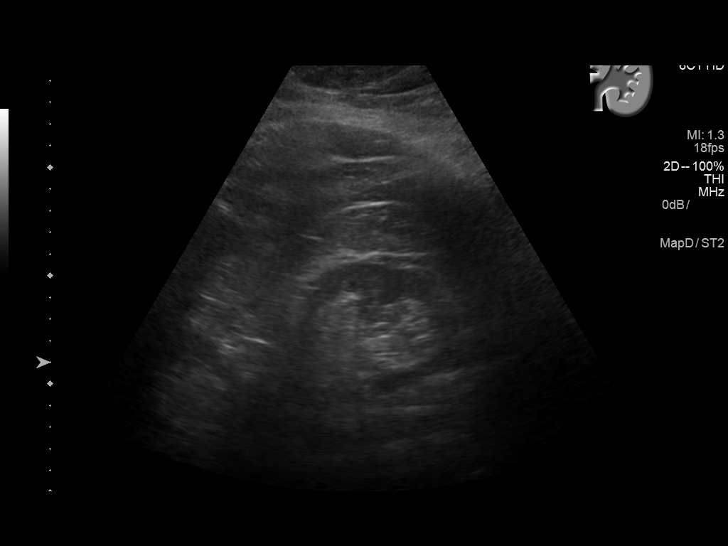
[im 25/28]
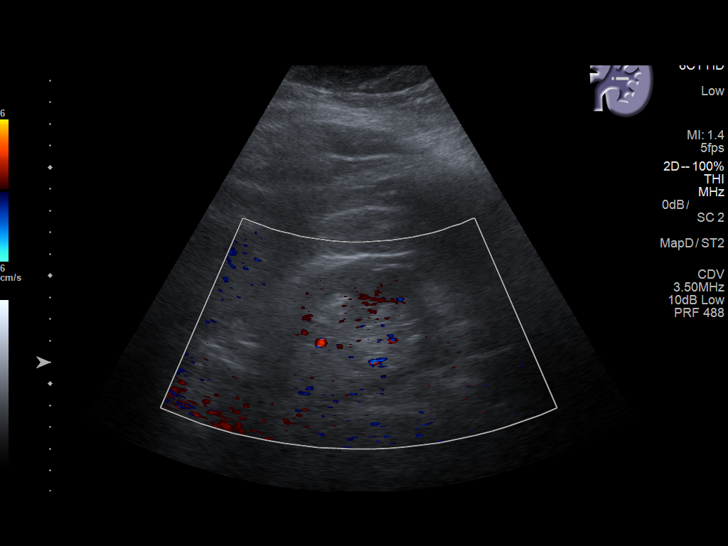
[im 28/28]
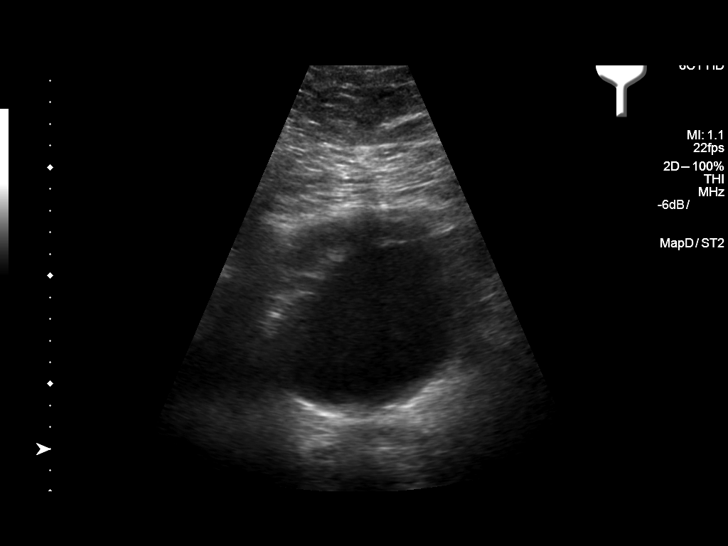

[14 of 25 positions shown; findings below may reference images not displayed]

FINDINGS: Right Kidney:

Length: 12.1 cm.. Echogenicity within normal limits. No mass or
hydronephrosis visualized.

Left Kidney:

Length: 12.9 cm.. Echogenicity within normal limits. No mass or
hydronephrosis visualized.

Bladder:

Appears normal for degree of bladder distention.
IMPRESSION: No acute abnormality noted.

## 2017-07-19 DIAGNOSIS — J449 Chronic obstructive pulmonary disease, unspecified: Secondary | ICD-10-CM

## 2017-07-19 NOTE — Progress Notes (Signed)
Daily Session Note  Patient Details  Name: Ernest Sims MRN: [id number] Date of Birth: 1955-03-17 Referring Provider:     Pulmonary Rehab from 06/08/2017 in [facility] Cardiac and Pulmonary Rehab  Referring Provider  Lottie Rater MD      Encounter Date: 07/19/2017  Check In: Session Check In - 07/19/17 1138      Check-In   Location  [facility]-Cardiac & Pulmonary Rehab    Staff Present  Heath Lark, RN, BSN, Laveda Norman, BS, ACSM CEP, Exercise Physiologist;Ebany Bowermaster Oletta Darter, [place], ACSM CEP, Exercise Physiologist    Supervising physician immediately available to respond to emergencies  [facility] immediately available ER MD    Physician(s)  Clearnce Hasten and Jimmye Norman    Medication changes reported      No    Fall or balance concerns reported     No    Warm-up and Cool-down  Performed as group-led Higher education careers adviser Performed  Yes    VAD Patient?  No      Pain Assessment   Currently in Pain?  No/denies    Multiple Pain Sites  No          Social History   Tobacco Use  Smoking Status Former Smoker  . Packs/day: 3.00  . Years: 24.00  . Pack years: 72.00  . Types: Cigarettes  . Last attempt to quit: 03/08/1988  . Years since quitting: 29.3  Smokeless Tobacco Never Used    Goals Met:  Proper associated with RPD/PD & O2 Sat Independence with exercise equipment Exercise tolerated well Strength training completed today  Goals Unmet:  Not Applicable  Comments:  6 Minute Walk    Row Name 06/08/17 1557         6 Minute Walk   Phase  Initial     Distance  110 feet     Walk Time  1.23 minutes     # of Rest Breaks  2 standing rest 10 sec, sat at 2:04 unable to get up to continue     MPH  1.01     METS  - unable to calculate, 1.77 per speed     RPE  17     Perceived Dyspnea   4     VO2 Peak  - unable to calculate (negative value)     Symptoms  Yes (comment)     Comments  using rollator for support, SOB, knee pain 10/10, overall fatigued     Resting  HR  68 bpm     Resting BP  134/62     Resting Oxygen Saturation   99 %     Exercise Oxygen Saturation  during 6 min walk  91 %     Max Ex. HR  91 bpm     Max Ex. BP  148/68     2 Minute Post BP  140/60       Interval HR   1 Minute HR  91     2 Minute HR  91     3 Minute HR  82     4 Minute HR  75     5 Minute HR  76     6 Minute HR  72     2 Minute Post HR  71     Interval Heart Rate?  Yes       Interval Oxygen   Interval Oxygen?  Yes     Baseline Oxygen Saturation %  99 %  1 Minute Oxygen Saturation %  96 % standing rest 1:30-1:40     1 Minute Liters of Oxygen  0 L Room Air     2 Minute Oxygen Saturation %  94 % seated rest at 2:04 Unable to get back up     2 Minute Liters of Oxygen  0 L     3 Minute Oxygen Saturation %  91 %     3 Minute Liters of Oxygen  0 L     4 Minute Oxygen Saturation %  97 %     4 Minute Liters of Oxygen  0 L     5 Minute Oxygen Saturation %  100 %     5 Minute Liters of Oxygen  0 L     6 Minute Oxygen Saturation %  98 %     6 Minute Liters of Oxygen  0 L     2 Minute Post Oxygen Saturation %  98 %     2 Minute Post Liters of Oxygen  0 L          Dr. Emily Filbert is Medical Director for [place] and [facility].

## 2017-07-21 ENCOUNTER — Encounter: Payer: BLUE CROSS/BLUE SHIELD | Admitting: Dietician

## 2017-07-28 ENCOUNTER — Encounter: Payer: BLUE CROSS/BLUE SHIELD | Admitting: *Deleted

## 2017-07-28 DIAGNOSIS — J449 Chronic obstructive pulmonary disease, unspecified: Secondary | ICD-10-CM

## 2017-07-28 NOTE — Progress Notes (Signed)
Daily Session Note  Patient Details  Name: Ernest Sims MRN: [id number] Date of Birth: [birth year] Referring Provider:     Pulmonary Rehab from 06/08/2017 in [facility] Cardiac and Pulmonary Rehab  Referring Provider  Lottie Rater MD      Encounter Date: 07/28/2017  Check In: Session Check In - 07/28/17 1133      Check-In   Location  [facility]-Cardiac & Pulmonary Rehab    Staff Present  Renita Papa, RN BSN;Jessica Luan Pulling, MA, RCEP, CCRP, Exercise Physiologist;Joseph Flavia Shipper    Supervising physician immediately available to respond to emergencies  [facility] immediately available ER MD    Physician(s)  Dr. Joni Fears and Quentin Cornwall    Medication changes reported      No    Fall or balance concerns reported     No    Tobacco Cessation  No Change    Warm-up and Cool-down  Performed as group-led instruction    Resistance Training Performed  Yes    VAD Patient?  No      Pain Assessment   Currently in Pain?  No/denies          Social History   Tobacco Use  Smoking Status Former Smoker  . Packs/day: 3.00  . Years: 24.00  . Pack years: 72.00  . Types: Cigarettes  . Last attempt to quit: 03/08/1988  . Years since quitting: 29.4  Smokeless Tobacco Never Used    Goals Met:  Proper associated with RPD/PD & O2 Sat Independence with exercise equipment Using PLB without cueing & demonstrates good technique Exercise tolerated well No report of cardiac concerns or symptoms Strength training completed today  Goals Unmet:  Not Applicable  Comments: Pt able to follow exercise prescription today without complaint.  Will continue to monitor for progression.    Dr. Emily Filbert is Medical Director for [place] and [facility].

## 2017-07-30 ENCOUNTER — Encounter: Payer: BLUE CROSS/BLUE SHIELD | Admitting: *Deleted

## 2017-07-30 DIAGNOSIS — J449 Chronic obstructive pulmonary disease, unspecified: Secondary | ICD-10-CM

## 2017-07-30 NOTE — Progress Notes (Signed)
Daily Session Note  Patient Details  Name: Ernest Sims MRN: [id number] Date of Birth: 1955/09/15 Referring Provider:     Pulmonary Rehab from 06/08/2017 in [facility]  Referring Provider  Lottie Rater MD      Encounter Date: 07/30/2017  Check In: Session Check In - 07/30/17 1139      Check-In   Location  [facility]-Cardiac & Pulmonary Rehab    Staff Present  Joellyn Rued, BS, PEC;Lawarence Meek [place], RN BSN;Jessica Luan [place], [place], RCEP, CCRP, Exercise Physiologist    Supervising physician immediately available to respond to emergencies  [facility] immediately available ER MD    Physician(s)  Dr. Kerman Passey and Corky Downs    Medication changes reported      No    Fall or balance concerns reported     No    Tobacco Cessation  No Change    Warm-up and Cool-down  Performed as group-led instruction    Resistance Training Performed  Yes      Pain Assessment   Currently in Pain?  No/denies          Social History   Tobacco Use  Smoking Status Former Smoker  . Packs/day: 3.00  . Years: 24.00  . Pack years: 72.00  . Types: Cigarettes  . Last attempt to quit: 03/08/1988  . Years since quitting: 29.4  Smokeless Tobacco Never Used    Goals Met:  Proper associated with RPD/PD & O2 Sat Independence with exercise equipment Using PLB without cueing & demonstrates good technique Exercise tolerated well No report of cardiac concerns or symptoms Strength training completed today  Goals Unmet:  Not Applicable  Comments: Pt able to follow exercise prescription today without complaint.  Will continue to monitor for progression.    Dr. Emily Filbert is Medical Director for [facility] and [facility].

## 2017-08-02 ENCOUNTER — Encounter: Payer: BLUE CROSS/BLUE SHIELD | Attending: Rehabilitation | Admitting: *Deleted

## 2017-08-02 DIAGNOSIS — J449 Chronic obstructive pulmonary disease, unspecified: Secondary | ICD-10-CM | POA: Diagnosis present

## 2017-08-02 DIAGNOSIS — Z87891 Personal history of nicotine dependence: Secondary | ICD-10-CM | POA: Diagnosis not present

## 2017-08-02 DIAGNOSIS — Z6841 Body Mass Index (BMI) 40.0 and over, adult: Secondary | ICD-10-CM | POA: Insufficient documentation

## 2017-08-02 NOTE — Progress Notes (Signed)
Pulmonary Individual Treatment Plan  Patient Details  Name: Ernest Sims MRN: [id number] Date of Birth: Nov 30, 1955 Referring Provider:     Pulmonary Rehab from 06/08/2017 in [facility] Cardiac and Pulmonary Rehab  Referring Provider  Lottie Rater MD      Initial Encounter Date:    Pulmonary Rehab from 06/08/2017 in [facility] Cardiac and Pulmonary Rehab  Date  06/08/17  Referring Provider  Lottie Rater MD      Visit Diagnosis: Chronic obstructive pulmonary disease, unspecified COPD type ([place])  Patient's Home Medications on Admission:  Current Outpatient Medications:  .  albuterol (PROVENTIL HFA;VENTOLIN HFA) 108 (90 Base) MCG/ACT inhaler, Inhale 2 puffs into the lungs every 6 (six) hours as needed for wheezing or shortness of breath., Disp: 1 Inhaler, Rfl: 2 .  allopurinol (ZYLOPRIM) 300 MG tablet, Take 300 mg by mouth as needed. , Disp: , Rfl:  .  amitriptyline (ELAVIL) 25 MG tablet, Take 25 mg by mouth at bedtime., Disp: , Rfl:  .  amLODipine-olmesartan (AZOR) 5-40 MG tablet, Take 1 tablet by mouth daily. , Disp: , Rfl:  .  aspirin EC 81 MG EC tablet, Take 1 tablet (81 mg total) by mouth daily. (Patient taking differently: Take 81 mg by mouth 2 (two) times daily. ), Disp: , Rfl:  .  diazepam (VALIUM) 10 MG tablet, Take 1 tablet (10 mg total) by mouth every 8 (eight) hours as needed for anxiety., Disp: 30 tablet, Rfl: 0 .  furosemide (LASIX) 40 MG tablet, Take 40 mg by mouth daily., Disp: , Rfl:  .  latanoprost (XALATAN) 0.005 % ophthalmic solution, Apply to eye., Disp: , Rfl:  .  levothyroxine (SYNTHROID, LEVOTHROID) 50 MCG tablet, Take 50 mcg by mouth daily before breakfast. , Disp: , Rfl:  .  metaxalone (SKELAXIN) 800 MG tablet, Take 800 mg by mouth., Disp: , Rfl:  .  omeprazole (PRILOSEC) 40 MG capsule, Take 40 mg by mouth 2 (two) times daily. , Disp: , Rfl:  .  oxyCODONE (OXY IR/ROXICODONE) 5 MG immediate release tablet, Take 2 tablets (10 mg total) by mouth every 4  (four) hours as needed for moderate pain or severe pain. (Patient not taking: Reported on 06/29/2017), Disp: 30 tablet, Rfl: 0 .  oxyCODONE-acetaminophen (ROXICET) 5-325 MG tablet, Take 1 tablet by mouth every 6 (six) hours as needed for severe pain., Disp: 8 tablet, Rfl: 0 .  OXYGEN, Inhale 2.5 L into the lungs as needed., Disp: , Rfl:  .  pramipexole (MIRAPEX) 0.5 MG tablet, Take 0.5 mg by mouth 2 (two) times daily., Disp: , Rfl:  .  pravastatin (PRAVACHOL) 40 MG tablet, Take 40 mg by mouth at bedtime. , Disp: , Rfl:  .  predniSONE (STERAPRED UNI-PAK 21 TAB) 10 MG (21) TBPK tablet, Take by mouth daily. Dispense steroid taper pack as directed (Patient not taking: Reported on 04/23/2017), Disp: 21 tablet, Rfl: 0 .  propranolol (INDERAL) 40 MG tablet, Take 40 mg by mouth 2 (two) times daily., Disp: , Rfl:  .  timolol (TIMOPTIC) 0.5 % ophthalmic solution, Place 1 drop into both eyes 2 (two) times daily., Disp: , Rfl:  .  topiramate (TOPAMAX) 100 MG tablet, Take 100 mg by mouth 2 (two) times daily., Disp: , Rfl:  .  torsemide (DEMADEX) 20 MG tablet, Take 10 mg by mouth., Disp: , Rfl:  .  triamcinolone (NASACORT) 55 MCG/ACT AERO nasal inhaler, Place 2 sprays into the nose 2 (two) times daily as needed. , Disp: , Rfl:  .  zolpidem (AMBIEN) 10 MG tablet, Take 1 tablet (10 mg total) by mouth at bedtime as needed for sleep., Disp: 10 tablet, Rfl: 0  Past Medical History: Past Medical History:  Diagnosis Date  . Anxiety   . Arthritis   . Colitis   . COPD (chronic obstructive pulmonary disease) (HCC)    2.5 L O2 AT NIGHT  . Crohn disease ([place])   . Diverticulosis   . Dyspnea    OFTEN  . GERD (gastroesophageal reflux disease)   . Hypercholesteremia   . Hypertension   . Migraines   . Motion sickness   . Orthopnea    sleeps in chair  . Restless leg syndrome   . Seasonal allergies     Tobacco Use: Social History   Tobacco Use  Smoking Status Former Smoker  . Packs/day: 3.00  . Years: 24.00    . Pack years: 72.00  . Types: Cigarettes  . Last attempt to quit: 03/08/1988  . Years since quitting: 29.4  Smokeless Tobacco Never Used    Labs: Recent Review Scientist, physiological    Labs for ITP Cardiac and Pulmonary Rehab Latest Ref Rng & Units 04/27/2015   Cholestrol 0 - 200 mg/dL 172   LDLCALC 0 - 99 mg/dL 73   HDL >40 mg/dL 40(L)   Trlycerides <150 mg/dL 297(H)       Pulmonary Assessment Scores: Pulmonary Assessment Scores    Row Name 06/08/17 1525         ADL UCSD   ADL Phase  Entry     SOB Score total  106     Rest  4     Walk  5     Stairs  5     Bath  4     Dress  5     Shop  5       CAT Score   CAT Score  30       mMRC Score   mMRC Score  4        Pulmonary Function Assessment: Pulmonary Function Assessment - 06/08/17 1456      Breath   Bilateral Breath Sounds  --    Shortness of Breath  --       Exercise Target Goals:    Exercise Program Goal: Individual exercise prescription set using results from initial 6 min walk test and THRR while considering  patient's activity barriers and safety.    Exercise Prescription Goal: Initial exercise prescription builds to 30-45 minutes a day of aerobic activity, 2-3 days per week.  Home exercise guidelines will be given to patient during program as part of exercise prescription that the participant will acknowledge.  Activity Barriers & Risk Stratification: Activity Barriers & Cardiac Risk Stratification - 06/08/17 1601      Activity Barriers & Cardiac Risk Stratification   Activity Barriers  Arthritis;Right Hip Replacement;Muscular Weakness;Deconditioning;Shortness of Breath;Joint Problems;History of Falls;Balance Concerns R rotator cuff tear repair 3 yrs ago, THR 17 yrs ago (needs another), L shoulder occ pain       6 Minute Walk: 6 Minute Walk    Row Name 06/08/17 1557         6 Minute Walk   Phase  Initial     Distance  110 feet     Walk Time  1.23 minutes     # of Rest Breaks  2 standing rest  10 sec, sat at 2:04 unable to get up to continue     MPH  1.01     METS  - unable to calculate, 1.77 per speed     RPE  17     Perceived Dyspnea   4     VO2 Peak  - unable to calculate (negative value)     Symptoms  Yes (comment)     Comments  using rollator for support, SOB, knee pain 10/10, overall fatigued     Resting HR  68 bpm     Resting BP  134/62     Resting Oxygen Saturation   99 %     Exercise Oxygen Saturation  during 6 min walk  91 %     Max Ex. HR  91 bpm     Max Ex. BP  148/68     2 Minute Post BP  140/60       Interval HR   1 Minute HR  91     2 Minute HR  91     3 Minute HR  82     4 Minute HR  75     5 Minute HR  76     6 Minute HR  72     2 Minute Post HR  71     Interval Heart Rate?  Yes       Interval Oxygen   Interval Oxygen?  Yes     Baseline Oxygen Saturation %  99 %     1 Minute Oxygen Saturation %  96 % standing rest 1:30-1:40     1 Minute Liters of Oxygen  0 L Room Air     2 Minute Oxygen Saturation %  94 % seated rest at 2:04 Unable to get back up     2 Minute Liters of Oxygen  0 L     3 Minute Oxygen Saturation %  91 %     3 Minute Liters of Oxygen  0 L     4 Minute Oxygen Saturation %  97 %     4 Minute Liters of Oxygen  0 L     5 Minute Oxygen Saturation %  100 %     5 Minute Liters of Oxygen  0 L     6 Minute Oxygen Saturation %  98 %     6 Minute Liters of Oxygen  0 L     2 Minute Post Oxygen Saturation %  98 %     2 Minute Post Liters of Oxygen  0 L       Oxygen Initial Assessment: Oxygen Initial Assessment - 06/08/17 1458      Home Oxygen   Home Oxygen Device  Home Concentrator;E-Tanks patient is confused about his oxygen.    Sleep Oxygen Prescription  CPAP    Liters per minute  2.5    Home Exercise Oxygen Prescription  Continuous    Liters per minute  2.5    Home at Rest Exercise Oxygen Prescription  Continuous    Liters per minute  2.5    Compliance with Home Oxygen Use  Yes states he uses it at home becuase it makes him feel  better      Initial 6 min Walk   Oxygen Used  None      Program Oxygen Prescription   Program Oxygen Prescription  None      Intervention   Short Term Goals  To learn and exhibit compliance with exercise, home and travel O2 prescription;To learn and understand importance of maintaining oxygen saturations>88%;To  learn and demonstrate proper use of respiratory medications;To learn and demonstrate proper pursed lip breathing techniques or other breathing techniques.;To learn and understand importance of monitoring SPO2 with pulse oximeter and demonstrate accurate use of the pulse oximeter.    Long  Term Goals  Exhibits compliance with exercise, home and travel O2 prescription;Verbalizes importance of monitoring SPO2 with pulse oximeter and return demonstration;Maintenance of O2 saturations>88%;Exhibits proper breathing techniques, such as pursed lip breathing or other method taught during program session;Compliance with respiratory medication;Demonstrates proper use of MDI's       Oxygen Re-Evaluation: Oxygen Re-Evaluation    Row Name 06/23/17 1131 07/14/17 1228           Program Oxygen Prescription   Program Oxygen Prescription  -  None        Home Oxygen   Home Oxygen Device  -  Home Concentrator;E-Tanks      Sleep Oxygen Prescription  -  CPAP      Liters per minute  -  2.5      Home Exercise Oxygen Prescription  -  Continuous      Liters per minute  -  2.5      Home at Rest Exercise Oxygen Prescription  -  Continuous      Liters per minute  -  2.5      Compliance with Home Oxygen Use  -  Yes        Goals/Expected Outcomes   Short Term Goals  To learn and understand importance of maintaining oxygen saturations>88%;To learn and understand importance of monitoring SPO2 with pulse oximeter and demonstrate accurate use of the pulse oximeter.;To learn and demonstrate proper pursed lip breathing techniques or other breathing techniques.  To learn and understand importance of  maintaining oxygen saturations>88%;To learn and understand importance of monitoring SPO2 with pulse oximeter and demonstrate accurate use of the pulse oximeter.;To learn and demonstrate proper pursed lip breathing techniques or other breathing techniques.;To learn and exhibit compliance with exercise, home and travel O2 prescription;To learn and demonstrate proper use of respiratory medications      Long  Term Goals  Verbalizes importance of monitoring SPO2 with pulse oximeter and return demonstration;Maintenance of O2 saturations>88%;Exhibits proper breathing techniques, such as pursed lip breathing or other method taught during program session  Verbalizes importance of monitoring SPO2 with pulse oximeter and return demonstration;Maintenance of O2 saturations>88%;Exhibits proper breathing techniques, such as pursed lip breathing or other method taught during program session;Exhibits compliance with exercise, home and travel O2 prescription;Compliance with respiratory medication;Demonstrates proper use of MDI's      Comments  Reviewed PLB technique with pt.  Talked about how it work and it's important to maintaining his exercise saturations.    Doug wears his oxygen at home full time, but does not have a portable concentrator.  He checks his saturations and they are usually around 92%.  He continues to use his inhalers and nebulizers.  He only use his nebulizer every other day as it makes him feel "dopey headed".   He has been using his pursed lip breathing when he can remember and  can tell it does make a difference for him.   He is doing well on his respiratory meds.  He has changed [employer] to [facility] and it hasn;t been very long to make a big difference yet.       Goals/Expected Outcomes  Short: Become more profiecient at using PLB.   Long: Become independent at using PLB.  Short: Continue to  work on PLB at home and in class.  Long: Continue to stay compliant with oxygen use and try not to use it as much  when just at home.          Oxygen Discharge (Final Oxygen Re-Evaluation): Oxygen Re-Evaluation - 07/14/17 1228      Program Oxygen Prescription   Program Oxygen Prescription  None      Home Oxygen   Home Oxygen Device  Home Concentrator;E-Tanks    Sleep Oxygen Prescription  CPAP    Liters per minute  2.5    Home Exercise Oxygen Prescription  Continuous    Liters per minute  2.5    Home at Rest Exercise Oxygen Prescription  Continuous    Liters per minute  2.5    Compliance with Home Oxygen Use  Yes      Goals/Expected Outcomes   Short Term Goals  To learn and understand importance of maintaining oxygen saturations>88%;To learn and understand importance of monitoring SPO2 with pulse oximeter and demonstrate accurate use of the pulse oximeter.;To learn and demonstrate proper pursed lip breathing techniques or other breathing techniques.;To learn and exhibit compliance with exercise, home and travel O2 prescription;To learn and demonstrate proper use of respiratory medications    Long  Term Goals  Verbalizes importance of monitoring SPO2 with pulse oximeter and return demonstration;Maintenance of O2 saturations>88%;Exhibits proper breathing techniques, such as pursed lip breathing or other method taught during program session;Exhibits compliance with exercise, home and travel O2 prescription;Compliance with respiratory medication;Demonstrates proper use of MDI's    Comments  Marden Noble wears his oxygen at home full time, but does not have a portable concentrator.  He checks his saturations and they are usually around 92%.  He continues to use his inhalers and nebulizers.  He only use his nebulizer every other day as it makes him feel "dopey headed".   He has been using his pursed lip breathing when he can remember and  can tell it does make a difference for him.   He is doing well on his respiratory meds.  He has changed Spriva to [facility] and it hasn;t been very long to make a big difference yet.      Goals/Expected Outcomes  Short: Continue to work on PLB at home and in class.  Long: Continue to stay compliant with oxygen use and try not to use it as much when just at home.        Initial Exercise Prescription: Initial Exercise Prescription - 06/08/17 1600      Date of Initial Exercise RX and Referring Provider   Date  06/08/17    Referring Provider  Lottie Rater MD      Treadmill   MPH  0.5    Grade  0    Minutes  15    METs  1.4      NuStep   Level  1    SPM  80    Minutes  15    METs  1.4      Arm Ergometer   Level  1    RPM  50    Minutes  15    METs  1.4      Prescription Details   Frequency (times per week)  3    Duration  Progress to 45 minutes of aerobic exercise without signs/symptoms of physical distress      Intensity   THRR 40-80% of Max Heartrate  103-138    Ratings of Perceived Exertion  11-13    Perceived Dyspnea  0-4      Progression   Progression  Continue to progress workloads to maintain intensity without signs/symptoms of physical distress.      Resistance Training   Training Prescription  Yes    Weight  3 lbs    Reps  10-15       Perform Capillary Blood Glucose checks as needed.  Exercise Prescription Changes: Exercise Prescription Changes    Row Name 06/08/17 1600 06/23/17 1400 07/02/17 1200 07/06/17 1600 07/20/17 1300     Response to Exercise   Blood Pressure (Admit)  134/62  134/74  -  134/70  128/78   Blood Pressure (Exercise)  148/68  148/80  -  132/76  -   Blood Pressure (Exit)  140/60  112/60  -  129/64  132/80   Heart Rate (Admit)  68 bpm  87 bpm  -  66 bpm  65 bpm   Heart Rate (Exercise)  91 bpm  104 bpm  -  82 bpm  73 bpm   Heart Rate (Exit)  71 bpm  77 bpm  -  69 bpm  68 bpm   Oxygen Saturation (Admit)  99 %  94 %  -  100 %  94 %   Oxygen Saturation (Exercise)  91 %  93 %  -  96 %  98 %   Oxygen Saturation (Exit)  98 %  94 %  -  99 %  98 %   Rating of Perceived Exertion (Exercise)  17  17  -  13  15    Perceived Dyspnea (Exercise)  4  4  -  2  3.5   Symptoms  SOB, knee pain 10/10, overall fatigued, used rollator  SOB, knee pain  -  SOB, knee pain, hip pain  SOB, knee pain, hip pain   Comments  walk test results  first full day of exercise  -  -  -   Duration  -  Progress to 45 minutes of aerobic exercise without signs/symptoms of physical distress  -  Progress to 45 minutes of aerobic exercise without signs/symptoms of physical distress  Progress to 45 minutes of aerobic exercise without signs/symptoms of physical distress   Intensity  -  THRR unchanged  -  THRR unchanged  THRR unchanged     Progression   Progression  -  Continue to progress workloads to maintain intensity without signs/symptoms of physical distress.  -  Continue to progress workloads to maintain intensity without signs/symptoms of physical distress.  Continue to progress workloads to maintain intensity without signs/symptoms of physical distress.   Average METs  -  1.72  -  2  2.1     Resistance Training   Training Prescription  -  Yes  -  Yes  Yes   Weight  -  3 lbs  -  3 lbs  3 lbs   Reps  -  10-15  -  10-15  10-15     Interval Training   Interval Training  -  No  -  No  No     Treadmill   MPH  -  1.1  -  -  -   Grade  -  0  -  -  -   Minutes  -  15  -  -  -   METs  -  1.84  -  -  -     NuStep   Level  -  1  -  2  2   SPM  -  67  -  70  80   Minutes  -  15  -  15  15   METs  -  1.6  -  1.9  2.1     Arm Ergometer   Level  -  -  -  1  1   RPM  -  -  -  41  39   Minutes  -  -  -  15  15   METs  -  -  -  2.1  2.1     Home Exercise Plan   Plans to continue exercise at  -  -  Home (comment) chair exercises  Home (comment) chair exercises  Home (comment) chair exercises   Frequency  -  -  Add 2 additional days to program exercise sessions.  Add 2 additional days to program exercise sessions.  Add 2 additional days to program exercise sessions.   Initial Home Exercises Provided  -  -  07/02/17  07/02/17  07/02/17        Exercise Comments: Exercise Comments    Row Name 06/23/17 1131           Exercise Comments   First full day of exercise!  Patient was oriented to gym and equipment including functions, settings, policies, and procedures.  Patient's individual exercise prescription and treatment plan were reviewed.  All starting workloads were established based on the results of the 6 minute walk test done at initial orientation visit.  The plan for exercise progression was also introduced and progression will be customized based on patient's performance and goals.          Exercise Goals and Review: Exercise Goals    Row Name 06/08/17 1608             Exercise Goals   Increase Physical Activity  Yes       Intervention  Provide advice, education, support and counseling about physical activity/exercise needs.;Develop an individualized exercise prescription for aerobic and resistive training based on initial evaluation findings, risk stratification, comorbidities and participant's personal goals.       Expected Outcomes  Short Term: Attend rehab on a regular basis to increase amount of physical activity.;Long Term: Add in home exercise to make exercise part of routine and to increase amount of physical activity.;Long Term: Exercising regularly at least 3-5 days a week.       Increase Strength and Stamina  Yes       Intervention  Provide advice, education, support and counseling about physical activity/exercise needs.;Develop an individualized exercise prescription for aerobic and resistive training based on initial evaluation findings, risk stratification, comorbidities and participant's personal goals.       Expected Outcomes  Short Term: Perform resistance training exercises routinely during rehab and add in resistance training at home;Long Term: Improve cardiorespiratory fitness, muscular endurance and strength as measured by increased METs and functional capacity (6MWT);Short Term: Increase  workloads from initial exercise prescription for resistance, speed, and METs.       Able to understand and use rate of perceived exertion (RPE) scale  Yes       Intervention  Provide education and explanation on how to use RPE scale       Expected Outcomes  Short Term: Able to use RPE daily in rehab to express subjective intensity level;Long Term:  Able to use RPE to guide intensity level when exercising independently  Able to understand and use Dyspnea scale  Yes       Intervention  Provide education and explanation on how to use Dyspnea scale       Expected Outcomes  Short Term: Able to use Dyspnea scale daily in rehab to express subjective sense of shortness of breath during exertion;Long Term: Able to use Dyspnea scale to guide intensity level when exercising independently       Knowledge and understanding of Target Heart Rate Range (THRR)  Yes       Intervention  Provide education and explanation of THRR including how the numbers were predicted and where they are located for reference       Expected Outcomes  Short Term: Able to state/look up THRR;Short Term: Able to use daily as guideline for intensity in rehab;Long Term: Able to use THRR to govern intensity when exercising independently       Able to check pulse independently  Yes       Intervention  Provide education and demonstration on how to check pulse in carotid and radial arteries.;Review the importance of being able to check your own pulse for safety during independent exercise       Expected Outcomes  Short Term: Able to explain why pulse checking is important during independent exercise;Long Term: Able to check pulse independently and accurately       Understanding of Exercise Prescription  Yes       Intervention  Provide education, explanation, and written materials on patient's individual exercise prescription       Expected Outcomes  Short Term: Able to explain program exercise prescription;Long Term: Able to explain home  exercise prescription to exercise independently          Exercise Goals Re-Evaluation : Exercise Goals Re-Evaluation    Row Name 06/23/17 1131 07/02/17 1230 07/06/17 1602 07/14/17 1218       Exercise Goal Re-Evaluation   Exercise Goals Review  Increase Physical Activity;Increase Strength and Stamina;Understanding of Exercise Prescription;Knowledge and understanding of Target Heart Rate Range (THRR);Able to understand and use rate of perceived exertion (RPE) scale  Increase Physical Activity;Able to understand and use Dyspnea scale;Understanding of Exercise Prescription;Increase Strength and Stamina;Knowledge and understanding of Target Heart Rate Range (THRR);Able to understand and use rate of perceived exertion (RPE) scale;Able to check pulse independently  Increase Physical Activity;Increase Strength and Stamina;Understanding of Exercise Prescription  Increase Physical Activity;Increase Strength and Stamina;Understanding of Exercise Prescription    Comments  Reviewed RPE scale, THR and program prescription with pt today.  Pt voiced understanding and was given a copy of goals to take home.   Reviewed home exercise with pt today.  Pt plans to do chair exercises at home for exercise.  Reviewed THR, pulse, RPE, sign and symptoms, NTG use, and when to call 911 or MD.  Also discussed weather considerations and indoor options.  Pt voiced understanding.  Marden Noble was only able to do seated exercise at last visit since his hip was still bothering. We told him that we would work with his issues and will do as much walking as he is able.  He is up to level 2 on the NuStep.  We will continue to monitor his progression.   Marden Noble has been doing fairly well in rehab.  He did get an aspiration shot in hip yesterday that has left him a little sore.  He is trying to do his chair exercises at home.  He doing them two days a week for 5  min each hour.  He is going to try to build to 30 min all at once.     Expected Outcomes   Short: Use RPE daily to regulate intensity.  Long: Follow program prescription in THR.  Short: Start with 2 days for 80mn each.  Long: Continue to exercise independently  Short: Increase workload on arm crank and NuStep.  Long: Continue to be more physically active.   Short: Increase exercise time at home to build to 317m. 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       Discharge Exercise Prescription (Final Exercise Prescription Changes): Exercise Prescription Changes - 07/20/17 1300      Response to Exercise   Blood Pressure (Admit)  128/78    Blood Pressure (Exit)  132/80    Heart Rate (Admit)  65 bpm    Heart Rate (Exercise)  73 bpm    Heart Rate (Exit)  68 bpm    Oxygen Saturation (Admit)  94 %    Oxygen Saturation (Exercise)  98 %    Oxygen Saturation (Exit)  98 %    Rating of Perceived Exertion (Exercise)  15    Perceived Dyspnea (Exercise)  3.5    Symptoms  SOB, knee pain, hip pain    Duration  Progress to 45 minutes of aerobic exercise without signs/symptoms of physical distress    Intensity  THRR unchanged      Progression   Progression  Continue to progress workloads to maintain intensity without signs/symptoms of physical distress.    Average METs  2.1      Resistance Training   Training Prescription  Yes    Weight  3 lbs    Reps  10-15      Interval Training   Interval Training  No      NuStep   Level  2    SPM  80    Minutes  15    METs  2.1      Arm Ergometer   Level  1    RPM  39    Minutes  15    METs  2.1      Home Exercise Plan   Plans to continue exercise at  Home (comment) chair exercises    Frequency  Add 2 additional days to program exercise sessions.    Initial Home Exercises Provided  07/02/17       Nutrition:  Target Goals: Understanding of nutrition guidelines, daily intake of sodium <150033mcholesterol <200m56malories 30% from fat and 7% or less from saturated fats, daily to have 5 or more servings of fruits and  vegetables.  Biometrics: Pre Biometrics - 06/08/17 1609      Pre Biometrics   Height  5' 7.6" (1.717 m)    Weight  363 lb 12.8 oz (165 kg)  (Abnormal)     Waist Circumference  52 inches    Hip Circumference  64 inches    Waist to Hip Ratio  0.81 %    BMI (Calculated)  55.97        Nutrition Therapy Plan and Nutrition Goals: Nutrition Therapy & Goals - 07/21/17 1130      Nutrition Therapy   Diet  TLC    Protein (specify units)  13oz    Fiber  30 grams    Whole Grain Foods  3 servings    Saturated Fats  16 max. grams    Fruits and Vegetables  4 servings/day 8 ideal; does not  eat many fruits at this time    Sodium  1200 grams      Personal Nutrition Goals   Nutrition Goal  Look for ways to minimize the amount of salt you consume daily, using the guidelines and handouts provided.    Personal Goal #2  Add in low salt/ no salt snacks such as raw vegetables, yogurt and fruit rather than chips and crackers.    Personal Goal #3  Work with your wife to cook more dinners at home, rather than eating out or choosing pre-packaged meals.    Comments  He has been restricting his salt intake x1 week and c/o food having no taste. Traditionally his diet has been very high in salt, snack foods, and red / processed meats      Intervention Plan   Intervention  Prescribe, educate and counsel regarding individualized specific dietary modifications aiming towards targeted core components such as weight, hypertension, lipid management, diabetes, heart failure and other comorbidities.;Nutrition handout(s) given to patient. Spice it up without salt and following a low sodium diet handouts provided    Expected Outcomes  Short Term Goal: Understand basic principles of dietary content, such as calories, fat, sodium, cholesterol and nutrients.;Short Term Goal: A plan has been developed with personal nutrition goals set during dietitian appointment.;Long Term Goal: Adherence to prescribed nutrition plan.        Nutrition Assessments: Nutrition Assessments - 06/23/17 1456      MEDFICTS Scores   Pre Score  105       Nutrition Goals Re-Evaluation: Nutrition Goals Re-Evaluation    Row Name 07/14/17 1224 07/21/17 1139           Goals   Nutrition Goal  Marden Noble has met with [place] nutritionist, but is scheudled to meet with Lattie Haw next Wednesday at 38  Look for ways to minimize the amount of salt you consume daily, using the guidelines and handouts provided.      Comment  [facility] and they were impressed with how he was doing with weight loss, but he still needs to lose more.  He will meet with Lattie Haw next week.   He has been following a low sodium diet x1 week and c/o food having no taste. He has been eating slightly less d/t not liking many of his usual foods now that they don't have salt added to them. He has been instructed per MD not to include any salt in his diet.      Expected Outcome  Short: Meet with dietician.  Long: Continue to work on weight loss.   He will choose salt-free herbs, spices and other seasonings, along with learning to read nutrition facts labels to identify low sodium foods/ condiments/ dressings/ etc.        Personal Goal #2 Re-Evaluation   Personal Goal #2  -  Add in low salt / salt-free snacks such as raw vegetables, yogurt and fruit rather than chips and crackers.        Personal Goal #3 Re-Evaluation   Personal Goal #3  -  Work with your wife to cook more dinners at home rather than eating out or choosing pre-packaged meals         Nutrition Goals Discharge (Final Nutrition Goals Re-Evaluation): Nutrition Goals Re-Evaluation - 07/21/17 1139      Goals   Nutrition Goal  Look for ways to minimize the amount of salt you consume daily, using the guidelines and handouts provided.  Comment  He has been following a low sodium diet x1 week and c/o food having no taste. He has been eating slightly less d/t not liking many of his usual foods now  that they don't have salt added to them. He has been instructed per MD not to include any salt in his diet.    Expected Outcome  He will choose salt-free herbs, spices and other seasonings, along with learning to read nutrition facts labels to identify low sodium foods/ condiments/ dressings/ etc.      Personal Goal #2 Re-Evaluation   Personal Goal #2  Add in low salt / salt-free snacks such as raw vegetables, yogurt and fruit rather than chips and crackers.      Personal Goal #3 Re-Evaluation   Personal Goal #3  Work with your wife to cook more dinners at home rather than eating out or choosing pre-packaged meals       Psychosocial: Target Goals: Acknowledge presence or absence of significant depression and/or stress, maximize coping skills, provide positive support system. Participant is able to verbalize types and ability to use techniques and skills needed for reducing stress and depression.   Initial Review & Psychosocial Screening: Initial Psych Review & Screening - 06/08/17 1453      Initial Review   Current issues with  Current Sleep Concerns;Current Stress Concerns    Source of Stress Concerns  Chronic Illness    Comments  His breathing stresses him out.      Family Dynamics   Good Support System?  Yes    Comments  He can look to his wife and daughter for support      Barriers   Psychosocial barriers to participate in program  The patient should benefit from training in stress management and relaxation.      Screening Interventions   Interventions  Encouraged to exercise;Provide feedback about the scores to participant;Program counselor consult;To provide support and resources with identified psychosocial needs    Expected Outcomes  Short Term goal: Utilizing psychosocial counselor, staff and physician to assist with identification of specific Stressors or current issues interfering with healing process. Setting desired goal for each stressor or current issue identified.;Long  Term Goal: Stressors or current issues are controlled or eliminated.;Long Term goal: The participant improves quality of Life and PHQ9 Scores as seen by post scores and/or verbalization of changes;Short Term goal: Identification and review with participant of any Quality of Life or Depression concerns found by scoring the questionnaire.       Quality of Life Scores:  Scores of 19 and below usually indicate a poorer quality of life in these areas.  A difference of  2-3 points is a clinically meaningful difference.  A difference of 2-3 points in the total score of the Quality of Life Index has been associated with significant improvement in overall quality of life, self-image, physical symptoms, and general health in studies assessing change in quality of life.  PHQ-9: Recent Review Flowsheet Data    Depression screen [facility] 2/9 06/08/2017   Decreased Interest 1   Down, Depressed, Hopeless 1   PHQ - 2 Score 2   Altered sleeping 1   Tired, decreased energy 1   Change in appetite 2   Feeling bad or failure about yourself  0   Trouble concentrating 0   Moving slowly or fidgety/restless 1   Suicidal thoughts 0   PHQ-9 Score 7   Difficult doing work/chores Somewhat difficult     Interpretation of Total  Score  Total Score Depression Severity:  1-4 = Minimal depression, 5-9 = Mild depression, 10-14 = Moderate depression, 15-19 = Moderately severe depression, 20-27 = Severe depression   Psychosocial Evaluation and Intervention:   Psychosocial Re-Evaluation: Psychosocial Re-Evaluation    [place] Name 07/14/17 1234             Psychosocial Re-Evaluation   Current issues with  Current Stress Concerns;Current Sleep Concerns       Comments  Doug's biggest stressor has been his health and his other comorbidities.  Besides his pulmonary issues he also has hip and back pain that limit his ability to do things around the house.   He does have a strong support system in wife and daughter.  On average he  sleeps sporadiacly and attended sleep class today. His sleep has improved since starting the program.  He is now able to sleep in the bed some.  He has found class to be helpful       Expected Outcomes  Short: Meet with counselor.  Long: Continue to cope with his pulmonary disease.        Interventions  Stress management education;Encouraged to attend Pulmonary Rehabilitation for the exercise;Relaxation education       Continue Psychosocial Services   Follow up required by counselor       Comments  His breathing stresses him out.         Initial Review   Source of Stress Concerns  Chronic Illness;Unable to participate in former interests or hobbies;Unable to perform yard/household activities          Psychosocial Discharge (Final Psychosocial Re-Evaluation): Psychosocial Re-Evaluation - 07/14/17 1234      Psychosocial Re-Evaluation   Current issues with  Current Stress Concerns;Current Sleep Concerns    Comments  Doug's biggest stressor has been his health and his other comorbidities.  Besides his pulmonary issues he also has hip and back pain that limit his ability to do things around the house.   He does have a strong support system in wife and daughter.  On average he sleeps sporadiacly and attended sleep class today. His sleep has improved since starting the program.  He is now able to sleep in the bed some.  He has found class to be helpful    Expected Outcomes  Short: Meet with counselor.  Long: Continue to cope with his pulmonary disease.     Interventions  Stress management education;Encouraged to attend Pulmonary Rehabilitation for the exercise;Relaxation education    Continue Psychosocial Services   Follow up required by counselor    Comments  His breathing stresses him out.      Initial Review   Source of Stress Concerns  Chronic Illness;Unable to participate in former interests or hobbies;Unable to perform yard/household activities       Education: Education Goals: Education  classes will be provided on a weekly basis, covering required topics. Participant will state understanding/return demonstration of topics presented.  Learning Barriers/Preferences: Learning Barriers/Preferences - 06/08/17 1457      Learning Barriers/Preferences   Learning Barriers  Sight wears glasses    Learning Preferences  None       Education Topics:  Initial Evaluation Education: - Verbal, written and demonstration of respiratory meds, oximetry and breathing techniques. Instruction on use of nebulizers and MDIs and importance of monitoring MDI activations.   Pulmonary Rehab from 07/28/2017 in [facility] Cardiac and Pulmonary Rehab  Date  06/08/17  Educator  [facility]  Instruction Review Code  1- Verbalizes Understanding      General Nutrition Guidelines/Fats and Fiber: -Group instruction provided by verbal, written material, models and posters to present the general guidelines for heart healthy nutrition. Gives an explanation and review of dietary fats and fiber.   Pulmonary Rehab from 07/28/2017 in [facility] Cardiac and Pulmonary Rehab  Date  07/19/17  Educator  [facility]  Instruction Review Code  1- Verbalizes Understanding      Controlling Sodium/Reading Food Labels: -Group verbal and written material supporting the discussion of sodium use in heart healthy nutrition. Review and explanation with models, verbal and written materials for utilization of the food label.   Exercise Physiology & General Exercise Guidelines: - Group verbal and written instruction with models to review the exercise physiology of the cardiovascular system and associated critical values. Provides general exercise guidelines with specific guidelines to those with heart or lung disease.    Aerobic Exercise & Resistance Training: - Gives group verbal and written instruction on the various components of exercise. Focuses on aerobic and resistive training programs and the benefits of this training and how to safely progress  through these programs.   Pulmonary Rehab from 07/28/2017 in [facility] Cardiac and Pulmonary Rehab  Date  06/25/17  Educator  [facility]  Instruction Review Code  1- Verbalizes Understanding      Flexibility, Balance, Mind/Body Relaxation: Provides group verbal/written instruction on the benefits of flexibility and balance training, including mind/body exercise modes such as yoga, pilates and tai chi.  Demonstration and skill practice provided.   Pulmonary Rehab from 07/28/2017 in [facility] Cardiac and Pulmonary Rehab  Date  07/07/17  Educator  AS  Instruction Review Code  1- Verbalizes Understanding      Stress and Anxiety: - Provides group verbal and written instruction about the health risks of elevated stress and causes of high stress.  Discuss the correlation between heart/lung disease and anxiety and treatment options. Review healthy ways to manage with stress and anxiety.   Pulmonary Rehab from 07/28/2017 in [facility] Cardiac and Pulmonary Rehab  Date  07/28/17  Educator  [facility]  Instruction Review Code  1- Verbalizes Understanding      Depression: - Provides group verbal and written instruction on the correlation between heart/lung disease and depressed mood, treatment options, and the stigmas associated with seeking treatment.   Pulmonary Rehab from 07/28/2017 in [facility] Cardiac and Pulmonary Rehab  Date  07/14/17  Educator  [facility]  Instruction Review Code  1- Verbalizes Understanding      Exercise & Equipment Safety: - Individual verbal instruction and demonstration of equipment use and safety with use of the equipment.   Pulmonary Rehab from 07/28/2017 in [facility] Cardiac and Pulmonary Rehab  Date  06/08/17  Educator  [facility]  Instruction Review Code  1- Verbalizes Understanding      Infection Prevention: - Provides verbal and written material to individual with discussion of infection control including proper hand washing and proper equipment cleaning during exercise session.   Pulmonary Rehab from  07/28/2017 in [facility] Cardiac and Pulmonary Rehab  Date  06/08/17  Educator  [facility]  Instruction Review Code  1- Verbalizes Understanding      Falls Prevention: - Provides verbal and written material to individual with discussion of falls prevention and safety.   Pulmonary Rehab from 07/28/2017 in [facility] Cardiac and Pulmonary Rehab  Date  06/08/17  Educator  [facility]  Instruction Review Code  1- Verbalizes Understanding      Diabetes: - Individual verbal and written instruction to  review signs/symptoms of diabetes, desired ranges of glucose level fasting, after meals and with exercise. Advice that pre and post exercise glucose checks will be done for 3 sessions at entry of program.   Chronic Lung Diseases: - Group verbal and written instruction to review updates, respiratory medications, advancements in procedures and treatments. Discuss use of supplemental oxygen including available portable oxygen systems, continuous and intermittent flow rates, concentrators, personal use and safety guidelines. Review proper use of inhaler and spacers. Provide informative websites for self-education.    Energy Conservation: - Provide group verbal and written instruction for methods to conserve energy, plan and organize activities. Instruct on pacing techniques, use of adaptive equipment and posture/positioning to relieve shortness of breath.   Triggers and Exacerbations: - Group verbal and written instruction to review types of environmental triggers and ways to prevent exacerbations. Discuss weather changes, air quality and the benefits of nasal washing. Review warning signs and symptoms to help prevent infections. Discuss techniques for effective airway clearance, coughing, and vibrations.   Pulmonary Rehab from 07/28/2017 in [facility] Cardiac and Pulmonary Rehab  Date  06/23/17  Educator  [facility]  Instruction Review Code  3- Needs Reinforcement [late arrival]      AED/CPR: - Group verbal and written instruction with  the use of models to demonstrate the basic use of the AED with the basic ABC's of resuscitation.   Anatomy and Physiology of the Lungs: - Group verbal and written instruction with the use of models to provide basic lung anatomy and physiology related to function, structure and complications of lung disease.   Anatomy & Physiology of the Heart: - Group verbal and written instruction and models provide basic cardiac anatomy and physiology, with the coronary electrical and arterial systems. Review of Valvular disease and Heart Failure   Cardiac Medications: - Group verbal and written instruction to review commonly prescribed medications for heart disease. Reviews the medication, class of the drug, and side effects.   Know Your Numbers and Risk Factors: -Group verbal and written instruction about important numbers in your health.  Discussion of what are risk factors and how they play a role in the disease process.  Review of Cholesterol, Blood Pressure, Diabetes, and BMI and the role they play in your overall health.   Sleep Hygiene: -Provides group verbal and written instruction about how sleep can affect your health.  Define sleep hygiene, discuss sleep cycles and impact of sleep habits. Review good sleep hygiene tips.    Other: -Provides group and verbal instruction on various topics (see comments)    Knowledge Questionnaire Score: Knowledge Questionnaire Score - 06/08/17 1457      Knowledge Questionnaire Score   Pre Score  13/18 reviewed with patient        Core Components/Risk Factors/Patient Goals at Admission: Personal Goals and Risk Factors at Admission - 06/08/17 1500      Core Components/Risk Factors/Patient Goals on Admission    Weight Management  Yes;Obesity;Weight Loss    Intervention  Weight Management: Develop a combined nutrition and exercise program designed to reach desired caloric intake, while maintaining appropriate intake of nutrient and fiber, sodium and  fats, and appropriate energy expenditure required for the weight goal.;Weight Management: Provide education and appropriate resources to help participant work on and attain dietary goals.;Obesity: Provide education and appropriate resources to help participant work on and attain dietary goals.;Weight Management/Obesity: Establish reasonable short term and long term weight goals.    Admit Weight  363 lb 12.8 oz (165 kg)  Goal Weight: Short Term  355 lb (161 kg)    Goal Weight: Long Term  260 lb (117.9 kg)    Expected Outcomes  Short Term: Continue to assess and modify interventions until short term weight is achieved;Long Term: Adherence to nutrition and physical activity/exercise program aimed toward attainment of established weight goal;Weight Maintenance: Understanding of the daily nutrition guidelines, which includes 25-35% calories from fat, 7% or less cal from saturated fats, less than 243m cholesterol, less than 1.5gm of sodium, & 5 or more servings of fruits and vegetables daily;Understanding recommendations for meals to include 15-35% energy as protein, 25-35% energy from fat, 35-60% energy from carbohydrates, less than 2039mof dietary cholesterol, 20-35 gm of total fiber daily;Weight Loss: Understanding of general recommendations for a balanced deficit meal plan, which promotes 1-2 lb weight loss per week and includes a negative energy balance of [phone] kcal/d;Understanding of distribution of calorie intake throughout the day with the consumption of 4-5 meals/snacks    Improve shortness of breath with ADL's  Yes    Intervention  Provide education, individualized exercise plan and daily activity instruction to help decrease symptoms of SOB with activities of daily living.    Expected Outcomes  Short Term: Improve cardiorespiratory fitness to achieve a reduction of symptoms when performing ADLs;Long Term: Be able to perform more ADLs without symptoms or delay the onset of symptoms     Hypertension  Yes    Intervention  Provide education on lifestyle modifcations including regular physical activity/exercise, weight management, moderate sodium restriction and increased consumption of fresh fruit, vegetables, and low fat dairy, alcohol moderation, and smoking cessation.;Monitor prescription use compliance.    Expected Outcomes  Short Term: Continued assessment and intervention until BP is < 140/9051mG in hypertensive participants. < 130/45m67m in hypertensive participants with diabetes, heart failure or chronic kidney disease.;Long Term: Maintenance of blood pressure at goal levels.    Lipids  Yes    Intervention  Provide education and support for participant on nutrition & aerobic/resistive exercise along with prescribed medications to achieve LDL <70mg43mL >40mg.71mExpected Outcomes  Short Term: Participant states understanding of desired cholesterol values and is compliant with medications prescribed. Participant is following exercise prescription and nutrition guidelines.;Long Term: Cholesterol controlled with medications as prescribed, with individualized exercise RX and with personalized nutrition plan. Value goals: LDL < 70mg, 18m> 40 mg.       Core Components/Risk Factors/Patient Goals Review:  Goals and Risk Factor Review    Row Name 07/14/17 1220             Core Components/Risk Factors/Patient Goals Review   Personal Goals Review  Weight Management/Obesity;Improve shortness of breath with ADL's;Hypertension;Lipids       Review  Doug haMarden Nobleen doing well in rehab. He is down overall and eating better. [facility] and they were impressed with his weight loss.  Blood pressures have been good in the progream.  He has not been checking his pressure at home as he is not sure his wife has a cuff at home.  He is going to look for the cuff or get a new one.   His SOB is getting better, he still runs out of breath but it is improving.        Expected  Outcomes  Short: Get or find blood pressure to check at home.  Long; Continue to work on weight loss.  Core Components/Risk Factors/Patient Goals at Discharge (Final Review):  Goals and Risk Factor Review - 07/14/17 1220      Core Components/Risk Factors/Patient Goals Review   Personal Goals Review  Weight Management/Obesity;Improve shortness of breath with ADL's;Hypertension;Lipids    Review  Marden Noble has been doing well in rehab. He is down overall and eating better. [facility] and they were impressed with his weight loss.  Blood pressures have been good in the progream.  He has not been checking his pressure at home as he is not sure his wife has a cuff at home.  He is going to look for the cuff or get a new one.   His SOB is getting better, he still runs out of breath but it is improving.     Expected Outcomes  Short: Get or find blood pressure to check at home.  Long; Continue to work on weight loss.        ITP Comments: ITP Comments    Row Name 06/08/17 1419 06/30/17 1540 07/05/17 0854 08/02/17 0841     ITP Comments  Medical Evaluation completed. Chart sent for review and changes to Dr. Emily Filbert Director of [place]. Diagnosis can be found in [facility] encounter 05/06/17  Doug arrived early for rehab today and asked if he could just do seated equipment.  I told him that we could work on that. He is having an evaluation tomorrow for a possible redo hip replacement.  He was not able to stay for exercise today since he was wearing flip flops.  He will let us know the plan after tomorrow's visit.    30 day review completed. ITP sent to Dr. Emily Filbert Director of [place]. Continue with ITP unless changes are made by physician   30 day review completed. ITP sent to Dr. Emily Filbert Director of [place]. Continue with ITP unless changes are made by physician       Comments: 30 day review

## 2017-08-02 NOTE — Progress Notes (Signed)
Daily Session Note  Patient Details  Name: Ernest Sims MRN: [id number] Date of Birth: 12-Feb-1956 Referring Provider:     Pulmonary Rehab from 06/08/2017 in [facility] Cardiac and Pulmonary Rehab  Referring Provider  Lottie Rater MD      Encounter Date: 08/02/2017  Check In: Session Check In - 08/02/17 1135      Check-In   Location  [facility]-Cardiac & Pulmonary Rehab    Staff Present  Earlean Shawl, BS, ACSM CEP, Exercise Physiologist;Laureen Owens Shark, BS, RRT, Respiratory Therapist;Joseph Flavia Shipper    Supervising physician immediately available to respond to emergencies  [facility] immediately available ER MD    Physician(s)  Drs. Malinda and Kinner    Medication changes reported      No    Fall or balance concerns reported     No    Tobacco Cessation  No Change    Warm-up and Cool-down  Performed as group-led Higher education careers adviser Performed  Yes    VAD Patient?  No      Pain Assessment   Currently in Pain?  No/denies    Multiple Pain Sites  No          Social History   Tobacco Use  Smoking Status Former Smoker  . Packs/day: 3.00  . Years: 24.00  . Pack years: 72.00  . Types: Cigarettes  . Last attempt to quit: 03/08/1988  . Years since quitting: 29.4  Smokeless Tobacco Never Used    Goals Met:  Proper associated with RPD/PD & O2 Sat Independence with exercise equipment Exercise tolerated well No report of cardiac concerns or symptoms Strength training completed today  Goals Unmet:  Not Applicable  Comments: Pt able to follow exercise prescription today without complaint.  Will continue to monitor for progression.    Dr. Emily Filbert is Medical Director for [place] and [facility].

## 2017-08-18 ENCOUNTER — Encounter: Payer: Self-pay | Admitting: *Deleted

## 2017-08-18 ENCOUNTER — Telehealth: Payer: Self-pay | Admitting: *Deleted

## 2017-08-18 DIAGNOSIS — J449 Chronic obstructive pulmonary disease, unspecified: Secondary | ICD-10-CM

## 2017-08-18 NOTE — Telephone Encounter (Signed)
Called to check on pt.  Left message on voicemail.

## 2017-08-19 ENCOUNTER — Other Ambulatory Visit: Payer: Self-pay | Admitting: Orthopaedic Surgery

## 2017-08-19 DIAGNOSIS — M5136 Other intervertebral disc degeneration, lumbar region: Secondary | ICD-10-CM

## 2017-08-23 ENCOUNTER — Telehealth: Payer: Self-pay

## 2017-08-23 DIAGNOSIS — J449 Chronic obstructive pulmonary disease, unspecified: Secondary | ICD-10-CM

## 2017-08-23 NOTE — Progress Notes (Signed)
Discharge Progress Report  Patient Details  Name: Ernest Sims MRN: [id number] Date of Birth: Jul 27, 1955 Referring Provider:     Pulmonary Rehab from 06/08/2017 in [facility] Cardiac and Pulmonary Rehab  Referring Provider  Lottie Rater MD       Number of Visits: 12/36  Reason for Discharge:  Early Exit:  Personal and medical issues  Smoking History:  Social History   Tobacco Use  Smoking Status Former Smoker  . Packs/day: 3.00  . Years: 24.00  . Pack years: 72.00  . Types: Cigarettes  . Last attempt to quit: 03/08/1988  . Years since quitting: 29.4  Smokeless Tobacco Never Used    Diagnosis:  Chronic obstructive pulmonary disease, unspecified COPD type ([place])  ADL UCSD: Pulmonary Assessment Scores    Row Name 06/08/17 1525         ADL UCSD   ADL Phase  Entry     SOB Score total  106     Rest  4     Walk  5     Stairs  5     Bath  4     Dress  5     Shop  5       CAT Score   CAT Score  30       mMRC Score   mMRC Score  4        Initial Exercise Prescription: Initial Exercise Prescription - 06/08/17 1600      Date of Initial Exercise RX and Referring Provider   Date  06/08/17    Referring Provider  Lottie Rater MD      Treadmill   MPH  0.5    Grade  0    Minutes  15    METs  1.4      NuStep   Level  1    SPM  80    Minutes  15    METs  1.4      Arm Ergometer   Level  1    RPM  50    Minutes  15    METs  1.4      Prescription Details   Frequency (times per week)  3    Duration  Progress to 45 minutes of aerobic exercise without signs/symptoms of physical distress      Intensity   THRR 40-80% of Max Heartrate  103-138    Ratings of Perceived Exertion  11-13    Perceived Dyspnea  0-4      Progression   Progression  Continue to progress workloads to maintain intensity without signs/symptoms of physical distress.      Resistance Training   Training Prescription  Yes    Weight  3 lbs    Reps  10-15        Discharge Exercise Prescription (Final Exercise Prescription Changes): Exercise Prescription Changes - 08/05/17 1400      Response to Exercise   Blood Pressure (Admit)  100/60    Blood Pressure (Exit)  90/60    Heart Rate (Admit)  69 bpm    Heart Rate (Exercise)  72 bpm    Heart Rate (Exit)  65 bpm    Oxygen Saturation (Admit)  96 %    Oxygen Saturation (Exercise)  98 %    Oxygen Saturation (Exit)  98 %    Rating of Perceived Exertion (Exercise)  16    Perceived Dyspnea (Exercise)  3    Duration  Progress to 45 minutes of  aerobic exercise without signs/symptoms of physical distress    Intensity  THRR unchanged      Progression   Progression  Continue to progress workloads to maintain intensity without signs/symptoms of physical distress.    Average METs  2      Resistance Training   Training Prescription  Yes    Weight  3 lb    Reps  10-15      Interval Training   Interval Training  No      Arm Ergometer   Level  1    RPM  40    Minutes  15    METs  1.9      Home Exercise Plan   Plans to continue exercise at  Home (comment) chair exercises    Frequency  Add 2 additional days to program exercise sessions.    Initial Home Exercises Provided  07/02/17       Functional Capacity: 6 Minute Walk    Row Name 06/08/17 1557         6 Minute Walk   Phase  Initial     Distance  110 feet     Walk Time  1.23 minutes     # of Rest Breaks  2 standing rest 10 sec, sat at 2:04 unable to get up to continue     MPH  1.01     METS  - unable to calculate, 1.77 per speed     RPE  17     Perceived Dyspnea   4     VO2 Peak  - unable to calculate (negative value)     Symptoms  Yes (comment)     Comments  using rollator for support, SOB, knee pain 10/10, overall fatigued     Resting HR  68 bpm     Resting BP  134/62     Resting Oxygen Saturation   99 %     Exercise Oxygen Saturation  during 6 min walk  91 %     Max Ex. HR  91 bpm     Max Ex. BP  148/68     2 Minute Post  BP  140/60       Interval HR   1 Minute HR  91     2 Minute HR  91     3 Minute HR  82     4 Minute HR  75     5 Minute HR  76     6 Minute HR  72     2 Minute Post HR  71     Interval Heart Rate?  Yes       Interval Oxygen   Interval Oxygen?  Yes     Baseline Oxygen Saturation %  99 %     1 Minute Oxygen Saturation %  96 % standing rest 1:30-1:40     1 Minute Liters of Oxygen  0 L Room Air     2 Minute Oxygen Saturation %  94 % seated rest at 2:04 Unable to get back up     2 Minute Liters of Oxygen  0 L     3 Minute Oxygen Saturation %  91 %     3 Minute Liters of Oxygen  0 L     4 Minute Oxygen Saturation %  97 %     4 Minute Liters of Oxygen  0 L     5 Minute Oxygen Saturation %  100 %  5 Minute Liters of Oxygen  0 L     6 Minute Oxygen Saturation %  98 %     6 Minute Liters of Oxygen  0 L     2 Minute Post Oxygen Saturation %  98 %     2 Minute Post Liters of Oxygen  0 L        Psychological, QOL, Others - Outcomes: PHQ 2/9: Depression screen PHQ 2/9 06/08/2017  Decreased Interest 1  Down, Depressed, Hopeless 1  PHQ - 2 Score 2  Altered sleeping 1  Tired, decreased energy 1  Change in appetite 2  Feeling bad or failure about yourself  0  Trouble concentrating 0  Moving slowly or fidgety/restless 1  Suicidal thoughts 0  PHQ-9 Score 7  Difficult doing work/chores Somewhat difficult    Quality of Life:   Personal Goals: Goals established at orientation with interventions provided to work toward goal. Personal Goals and Risk Factors at Admission - 06/08/17 1500      Core Components/Risk Factors/Patient Goals on Admission    Weight Management  Yes;Obesity;Weight Loss    Intervention  Weight Management: Develop a combined nutrition and exercise program designed to reach desired caloric intake, while maintaining appropriate intake of nutrient and fiber, sodium and fats, and appropriate energy expenditure required for the weight goal.;Weight Management: Provide  education and appropriate resources to help participant work on and attain dietary goals.;Obesity: Provide education and appropriate resources to help participant work on and attain dietary goals.;Weight Management/Obesity: Establish reasonable short term and long term weight goals.    Admit Weight  363 lb 12.8 oz (165 kg)    Goal Weight: Short Term  355 lb (161 kg)    Goal Weight: Long Term  260 lb (117.9 kg)    Expected Outcomes  Short Term: Continue to assess and modify interventions until short term weight is achieved;Long Term: Adherence to nutrition and physical activity/exercise program aimed toward attainment of established weight goal;Weight Maintenance: Understanding of the daily nutrition guidelines, which includes 25-35% calories from fat, 7% or less cal from saturated fats, less than 266m cholesterol, less than 1.5gm of sodium, & 5 or more servings of fruits and vegetables daily;Understanding recommendations for meals to include 15-35% energy as protein, 25-35% energy from fat, 35-60% energy from carbohydrates, less than 2063mof dietary cholesterol, 20-35 gm of total fiber daily;Weight Loss: Understanding of general recommendations for a balanced deficit meal plan, which promotes 1-2 lb weight loss per week and includes a negative energy balance of [phone] kcal/d;Understanding of distribution of calorie intake throughout the day with the consumption of 4-5 meals/snacks    Improve shortness of breath with ADL's  Yes    Intervention  Provide education, individualized exercise plan and daily activity instruction to help decrease symptoms of SOB with activities of daily living.    Expected Outcomes  Short Term: Improve cardiorespiratory fitness to achieve a reduction of symptoms when performing ADLs;Long Term: Be able to perform more ADLs without symptoms or delay the onset of symptoms    Hypertension  Yes    Intervention  Provide education on lifestyle modifcations including regular physical  activity/exercise, weight management, moderate sodium restriction and increased consumption of fresh fruit, vegetables, and low fat dairy, alcohol moderation, and smoking cessation.;Monitor prescription use compliance.    Expected Outcomes  Short Term: Continued assessment and intervention until BP is < 140/9071mG in hypertensive participants. < 130/54m84m in hypertensive participants with diabetes, heart failure or chronic kidney  disease.;Long Term: Maintenance of blood pressure at goal levels.    Lipids  Yes    Intervention  Provide education and support for participant on nutrition & aerobic/resistive exercise along with prescribed medications to achieve LDL <68m, HDL >437m    Expected Outcomes  Short Term: Participant states understanding of desired cholesterol values and is compliant with medications prescribed. Participant is following exercise prescription and nutrition guidelines.;Long Term: Cholesterol controlled with medications as prescribed, with individualized exercise RX and with personalized nutrition plan. Value goals: LDL < 7071mHDL > 40 mg.        Personal Goals Discharge: Goals and Risk Factor Review    Row Name 07/14/17 1220             Core Components/Risk Factors/Patient Goals Review   Personal Goals Review  Weight Management/Obesity;Improve shortness of breath with ADL's;Hypertension;Lipids       Review  Ernest Sims been doing well in rehab. He is down overall and eating better. [facility] and they were impressed with his weight loss.  Blood pressures have been good in the progream.  He has not been checking his pressure at home as he is not sure his wife has a cuff at home.  He is going to look for the cuff or get a new one.   His SOB is getting better, he still runs out of breath but it is improving.        Expected Outcomes  Short: Get or find blood pressure to check at home.  Long; Continue to work on weight loss.           Exercise Goals  and Review: Exercise Goals    Row Name 06/08/17 1608             Exercise Goals   Increase Physical Activity  Yes       Intervention  Provide advice, education, support and counseling about physical activity/exercise needs.;Develop an individualized exercise prescription for aerobic and resistive training based on initial evaluation findings, risk stratification, comorbidities and participant's personal goals.       Expected Outcomes  Short Term: Attend rehab on a regular basis to increase amount of physical activity.;Long Term: Add in home exercise to make exercise part of routine and to increase amount of physical activity.;Long Term: Exercising regularly at least 3-5 days a week.       Increase Strength and Stamina  Yes       Intervention  Provide advice, education, support and counseling about physical activity/exercise needs.;Develop an individualized exercise prescription for aerobic and resistive training based on initial evaluation findings, risk stratification, comorbidities and participant's personal goals.       Expected Outcomes  Short Term: Perform resistance training exercises routinely during rehab and add in resistance training at home;Long Term: Improve cardiorespiratory fitness, muscular endurance and strength as measured by increased METs and functional capacity (6MWT);Short Term: Increase workloads from initial exercise prescription for resistance, speed, and METs.       Able to understand and use rate of perceived exertion (RPE) scale  Yes       Intervention  Provide education and explanation on how to use RPE scale       Expected Outcomes  Short Term: Able to use RPE daily in rehab to express subjective intensity level;Long Term:  Able to use RPE to guide intensity level when exercising independently       Able to understand and use Dyspnea scale  Yes       Intervention  Provide education and explanation on how to use Dyspnea scale       Expected Outcomes  Short Term: Able  to use Dyspnea scale daily in rehab to express subjective sense of shortness of breath during exertion;Long Term: Able to use Dyspnea scale to guide intensity level when exercising independently       Knowledge and understanding of Target Heart Rate Range (THRR)  Yes       Intervention  Provide education and explanation of THRR including how the numbers were predicted and where they are located for reference       Expected Outcomes  Short Term: Able to state/look up THRR;Short Term: Able to use daily as guideline for intensity in rehab;Long Term: Able to use THRR to govern intensity when exercising independently       Able to check pulse independently  Yes       Intervention  Provide education and demonstration on how to check pulse in carotid and radial arteries.;Review the importance of being able to check your own pulse for safety during independent exercise       Expected Outcomes  Short Term: Able to explain why pulse checking is important during independent exercise;Long Term: Able to check pulse independently and accurately       Understanding of Exercise Prescription  Yes       Intervention  Provide education, explanation, and written materials on patient's individual exercise prescription       Expected Outcomes  Short Term: Able to explain program exercise prescription;Long Term: Able to explain home exercise prescription to exercise independently          Nutrition & Weight - Outcomes: Pre Biometrics - 06/08/17 1609      Pre Biometrics   Height  5' 7.6" (1.717 m)    Weight  363 lb 12.8 oz (165 kg)  (Abnormal)     Waist Circumference  52 inches    Hip Circumference  64 inches    Waist to Hip Ratio  0.81 %    BMI (Calculated)  55.97        Nutrition: Nutrition Therapy & Goals - 07/21/17 1130      Nutrition Therapy   Diet  TLC    Protein (specify units)  13oz    Fiber  30 grams    Whole Grain Foods  3 servings    Saturated Fats  16 max. grams    Fruits and Vegetables  4  servings/day 8 ideal; does not eat many fruits at this time    Sodium  1200 grams      Personal Nutrition Goals   Nutrition Goal  Look for ways to minimize the amount of salt you consume daily, using the guidelines and handouts provided.    Personal Goal #2  Add in low salt/ no salt snacks such as raw vegetables, yogurt and fruit rather than chips and crackers.    Personal Goal #3  Work with your wife to cook more dinners at home, rather than eating out or choosing pre-packaged meals.    Comments  He has been restricting his salt intake x1 week and c/o food having no taste. Traditionally his diet has been very high in salt, snack foods, and red / processed meats      Intervention Plan   Intervention  Prescribe, educate and counsel regarding individualized specific dietary modifications aiming towards targeted core components such as weight, hypertension, lipid management, diabetes,  heart failure and other comorbidities.;Nutrition handout(s) given to patient. Spice it up without salt and following a low sodium diet handouts provided    Expected Outcomes  Short Term Goal: Understand basic principles of dietary content, such as calories, fat, sodium, cholesterol and nutrients.;Short Term Goal: A plan has been developed with personal nutrition goals set during dietitian appointment.;Long Term Goal: Adherence to prescribed nutrition plan.       Nutrition Discharge: Nutrition Assessments - 06/23/17 1456      MEDFICTS Scores   Pre Score  105       Education Questionnaire Score: Knowledge Questionnaire Score - 06/08/17 1457      Knowledge Questionnaire Score   Pre Score  13/18 reviewed with patient       Goals reviewed with patient; copy given to patient.

## 2017-08-23 NOTE — Telephone Encounter (Signed)
Ernest Sims states he has a pinched nerve - supposed to get an injection in two weeks.  He cant stand or walk right now.  Would like to discharge and restart if possible.

## 2017-08-23 NOTE — Progress Notes (Signed)
Pulmonary Individual Treatment Plan  Patient Details  Name: Ernest Sims MRN: [id number] Date of Birth: 01-08-1956 Referring Provider:     Pulmonary Rehab from 06/08/2017 in [facility] Cardiac and Pulmonary Rehab  Referring Provider  Lottie Rater MD      Initial Encounter Date:    Pulmonary Rehab from 06/08/2017 in [facility] Cardiac and Pulmonary Rehab  Date  06/08/17  Referring Provider  Lottie Rater MD      Visit Diagnosis: Chronic obstructive pulmonary disease, unspecified COPD type ([place])  Patient's Home Medications on Admission:  Current Outpatient Medications:  .  albuterol (PROVENTIL HFA;VENTOLIN HFA) 108 (90 Base) MCG/ACT inhaler, Inhale 2 puffs into the lungs every 6 (six) hours as needed for wheezing or shortness of breath., Disp: 1 Inhaler, Rfl: 2 .  allopurinol (ZYLOPRIM) 300 MG tablet, Take 300 mg by mouth as needed. , Disp: , Rfl:  .  amitriptyline (ELAVIL) 25 MG tablet, Take 25 mg by mouth at bedtime., Disp: , Rfl:  .  aspirin EC 81 MG EC tablet, Take 1 tablet (81 mg total) by mouth daily. (Patient taking differently: Take 81 mg by mouth 2 (two) times daily. ), Disp: , Rfl:  .  diazepam (VALIUM) 10 MG tablet, Take 1 tablet (10 mg total) by mouth every 8 (eight) hours as needed for anxiety., Disp: 30 tablet, Rfl: 0 .  furosemide (LASIX) 40 MG tablet, Take 40 mg by mouth daily., Disp: , Rfl:  .  latanoprost (XALATAN) 0.005 % ophthalmic solution, Apply to eye., Disp: , Rfl:  .  levothyroxine (SYNTHROID, LEVOTHROID) 50 MCG tablet, Take 50 mcg by mouth daily before breakfast. , Disp: , Rfl:  .  metaxalone (SKELAXIN) 800 MG tablet, Take 800 mg by mouth., Disp: , Rfl:  .  omeprazole (PRILOSEC) 40 MG capsule, Take 40 mg by mouth 2 (two) times daily. , Disp: , Rfl:  .  oxyCODONE (OXY IR/ROXICODONE) 5 MG immediate release tablet, Take 2 tablets (10 mg total) by mouth every 4 (four) hours as needed for moderate pain or severe pain. (Patient not taking: Reported on  06/29/2017), Disp: 30 tablet, Rfl: 0 .  oxyCODONE-acetaminophen (ROXICET) 5-325 MG tablet, Take 1 tablet by mouth every 6 (six) hours as needed for severe pain., Disp: 8 tablet, Rfl: 0 .  OXYGEN, Inhale 2.5 L into the lungs as needed., Disp: , Rfl:  .  pramipexole (MIRAPEX) 0.5 MG tablet, Take 0.5 mg by mouth 2 (two) times daily., Disp: , Rfl:  .  pravastatin (PRAVACHOL) 40 MG tablet, Take 40 mg by mouth at bedtime. , Disp: , Rfl:  .  predniSONE (STERAPRED UNI-PAK 21 TAB) 10 MG (21) TBPK tablet, Take by mouth daily. Dispense steroid taper pack as directed (Patient not taking: Reported on 04/23/2017), Disp: 21 tablet, Rfl: 0 .  propranolol (INDERAL) 40 MG tablet, Take 40 mg by mouth 2 (two) times daily., Disp: , Rfl:  .  timolol (TIMOPTIC) 0.5 % ophthalmic solution, Place 1 drop into both eyes 2 (two) times daily., Disp: , Rfl:  .  topiramate (TOPAMAX) 100 MG tablet, Take 100 mg by mouth 2 (two) times daily., Disp: , Rfl:  .  torsemide (DEMADEX) 20 MG tablet, Take 10 mg by mouth., Disp: , Rfl:  .  triamcinolone (NASACORT) 55 MCG/ACT AERO nasal inhaler, Place 2 sprays into the nose 2 (two) times daily as needed. , Disp: , Rfl:  .  zolpidem (AMBIEN) 10 MG tablet, Take 1 tablet (10 mg total) by mouth at bedtime as needed  for sleep., Disp: 10 tablet, Rfl: 0  Past Medical History: Past Medical History:  Diagnosis Date  . Anxiety   . Arthritis   . Colitis   . COPD (chronic obstructive pulmonary disease) (HCC)    2.5 L O2 AT NIGHT  . Crohn disease ([place])   . Diverticulosis   . Dyspnea    OFTEN  . GERD (gastroesophageal reflux disease)   . Hypercholesteremia   . Hypertension   . Migraines   . Motion sickness   . Orthopnea    sleeps in chair  . Restless leg syndrome   . Seasonal allergies     Tobacco Use: Social History   Tobacco Use  Smoking Status Former Smoker  . Packs/day: 3.00  . Years: 24.00  . Pack years: 72.00  . Types: Cigarettes  . Last attempt to quit: 03/08/1988  . Years  since quitting: 29.4  Smokeless Tobacco Never Used    Labs: Recent Review Scientist, physiological    Labs for ITP Cardiac and Pulmonary Rehab Latest Ref Rng & Units 04/27/2015   Cholestrol 0 - 200 mg/dL 172   LDLCALC 0 - 99 mg/dL 73   HDL >40 mg/dL 40(L)   Trlycerides <150 mg/dL 297(H)       Pulmonary Assessment Scores: Pulmonary Assessment Scores    Row Name 06/08/17 1525         ADL UCSD   ADL Phase  Entry     SOB Score total  106     Rest  4     Walk  5     Stairs  5     Bath  4     Dress  5     Shop  5       CAT Score   CAT Score  30       mMRC Score   mMRC Score  4        Pulmonary Function Assessment: Pulmonary Function Assessment - 06/08/17 1456      Breath   Bilateral Breath Sounds  --    Shortness of Breath  --       Exercise Target Goals:    Exercise Program Goal: Individual exercise prescription set using results from initial 6 min walk test and THRR while considering  patient's activity barriers and safety.    Exercise Prescription Goal: Initial exercise prescription builds to 30-45 minutes a day of aerobic activity, 2-3 days per week.  Home exercise guidelines will be given to patient during program as part of exercise prescription that the participant will acknowledge.  Activity Barriers & Risk Stratification: Activity Barriers & Cardiac Risk Stratification - 06/08/17 1601      Activity Barriers & Cardiac Risk Stratification   Activity Barriers  Arthritis;Right Hip Replacement;Muscular Weakness;Deconditioning;Shortness of Breath;Joint Problems;History of Falls;Balance Concerns R rotator cuff tear repair 3 yrs ago, THR 17 yrs ago (needs another), L shoulder occ pain       6 Minute Walk: 6 Minute Walk    Row Name 06/08/17 1557         6 Minute Walk   Phase  Initial     Distance  110 feet     Walk Time  1.23 minutes     # of Rest Breaks  2 standing rest 10 sec, sat at 2:04 unable to get up to continue     MPH  1.01     METS  - unable to  calculate, 1.77 per speed  RPE  17     Perceived Dyspnea   4     VO2 Peak  - unable to calculate (negative value)     Symptoms  Yes (comment)     Comments  using rollator for support, SOB, knee pain 10/10, overall fatigued     Resting HR  68 bpm     Resting BP  134/62     Resting Oxygen Saturation   99 %     Exercise Oxygen Saturation  during 6 min walk  91 %     Max Ex. HR  91 bpm     Max Ex. BP  148/68     2 Minute Post BP  140/60       Interval HR   1 Minute HR  91     2 Minute HR  91     3 Minute HR  82     4 Minute HR  75     5 Minute HR  76     6 Minute HR  72     2 Minute Post HR  71     Interval Heart Rate?  Yes       Interval Oxygen   Interval Oxygen?  Yes     Baseline Oxygen Saturation %  99 %     1 Minute Oxygen Saturation %  96 % standing rest 1:30-1:40     1 Minute Liters of Oxygen  0 L Room Air     2 Minute Oxygen Saturation %  94 % seated rest at 2:04 Unable to get back up     2 Minute Liters of Oxygen  0 L     3 Minute Oxygen Saturation %  91 %     3 Minute Liters of Oxygen  0 L     4 Minute Oxygen Saturation %  97 %     4 Minute Liters of Oxygen  0 L     5 Minute Oxygen Saturation %  100 %     5 Minute Liters of Oxygen  0 L     6 Minute Oxygen Saturation %  98 %     6 Minute Liters of Oxygen  0 L     2 Minute Post Oxygen Saturation %  98 %     2 Minute Post Liters of Oxygen  0 L       Oxygen Initial Assessment: Oxygen Initial Assessment - 06/08/17 1458      Home Oxygen   Home Oxygen Device  Home Concentrator;E-Tanks patient is confused about his oxygen.    Sleep Oxygen Prescription  CPAP    Liters per minute  2.5    Home Exercise Oxygen Prescription  Continuous    Liters per minute  2.5    Home at Rest Exercise Oxygen Prescription  Continuous    Liters per minute  2.5    Compliance with Home Oxygen Use  Yes states he uses it at home becuase it makes him feel better      Initial 6 min Walk   Oxygen Used  None      Program Oxygen  Prescription   Program Oxygen Prescription  None      Intervention   Short Term Goals  To learn and exhibit compliance with exercise, home and travel O2 prescription;To learn and understand importance of maintaining oxygen saturations>88%;To learn and demonstrate proper use of respiratory medications;To learn and demonstrate proper pursed lip breathing techniques or other  breathing techniques.;To learn and understand importance of monitoring SPO2 with pulse oximeter and demonstrate accurate use of the pulse oximeter.    Long  Term Goals  Exhibits compliance with exercise, home and travel O2 prescription;Verbalizes importance of monitoring SPO2 with pulse oximeter and return demonstration;Maintenance of O2 saturations>88%;Exhibits proper breathing techniques, such as pursed lip breathing or other method taught during program session;Compliance with respiratory medication;Demonstrates proper use of MDI's       Oxygen Re-Evaluation: Oxygen Re-Evaluation    Row Name 06/23/17 1131 07/14/17 1228           Program Oxygen Prescription   Program Oxygen Prescription  -  None        Home Oxygen   Home Oxygen Device  -  Home Concentrator;E-Tanks      Sleep Oxygen Prescription  -  CPAP      Liters per minute  -  2.5      Home Exercise Oxygen Prescription  -  Continuous      Liters per minute  -  2.5      Home at Rest Exercise Oxygen Prescription  -  Continuous      Liters per minute  -  2.5      Compliance with Home Oxygen Use  -  Yes        Goals/Expected Outcomes   Short Term Goals  To learn and understand importance of maintaining oxygen saturations>88%;To learn and understand importance of monitoring SPO2 with pulse oximeter and demonstrate accurate use of the pulse oximeter.;To learn and demonstrate proper pursed lip breathing techniques or other breathing techniques.  To learn and understand importance of maintaining oxygen saturations>88%;To learn and understand importance of monitoring  SPO2 with pulse oximeter and demonstrate accurate use of the pulse oximeter.;To learn and demonstrate proper pursed lip breathing techniques or other breathing techniques.;To learn and exhibit compliance with exercise, home and travel O2 prescription;To learn and demonstrate proper use of respiratory medications      Long  Term Goals  Verbalizes importance of monitoring SPO2 with pulse oximeter and return demonstration;Maintenance of O2 saturations>88%;Exhibits proper breathing techniques, such as pursed lip breathing or other method taught during program session  Verbalizes importance of monitoring SPO2 with pulse oximeter and return demonstration;Maintenance of O2 saturations>88%;Exhibits proper breathing techniques, such as pursed lip breathing or other method taught during program session;Exhibits compliance with exercise, home and travel O2 prescription;Compliance with respiratory medication;Demonstrates proper use of MDI's      Comments  Reviewed PLB technique with pt.  Talked about how it work and it's important to maintaining his exercise saturations.    Ernest Sims wears his oxygen at home full time, but does not have a portable concentrator.  He checks his saturations and they are usually around 92%.  He continues to use his inhalers and nebulizers.  He only use his nebulizer every other day as it makes him feel "dopey headed".   He has been using his pursed lip breathing when he can remember and  can tell it does make a difference for him.   He is doing well on his respiratory meds.  He has changed [employer] to [facility] and it hasn;t been very long to make a big difference yet.       Goals/Expected Outcomes  Short: Become more profiecient at using PLB.   Long: Become independent at using PLB.  Short: Continue to work on PLB at home and in class.  Long: Continue to stay compliant with oxygen use and  try not to use it as much when just at home.          Oxygen Discharge (Final Oxygen Re-Evaluation): Oxygen  Re-Evaluation - 07/14/17 1228      Program Oxygen Prescription   Program Oxygen Prescription  None      Home Oxygen   Home Oxygen Device  Home Concentrator;E-Tanks    Sleep Oxygen Prescription  CPAP    Liters per minute  2.5    Home Exercise Oxygen Prescription  Continuous    Liters per minute  2.5    Home at Rest Exercise Oxygen Prescription  Continuous    Liters per minute  2.5    Compliance with Home Oxygen Use  Yes      Goals/Expected Outcomes   Short Term Goals  To learn and understand importance of maintaining oxygen saturations>88%;To learn and understand importance of monitoring SPO2 with pulse oximeter and demonstrate accurate use of the pulse oximeter.;To learn and demonstrate proper pursed lip breathing techniques or other breathing techniques.;To learn and exhibit compliance with exercise, home and travel O2 prescription;To learn and demonstrate proper use of respiratory medications    Long  Term Goals  Verbalizes importance of monitoring SPO2 with pulse oximeter and return demonstration;Maintenance of O2 saturations>88%;Exhibits proper breathing techniques, such as pursed lip breathing or other method taught during program session;Exhibits compliance with exercise, home and travel O2 prescription;Compliance with respiratory medication;Demonstrates proper use of MDI's    Comments  Ernest Sims wears his oxygen at home full time, but does not have a portable concentrator.  He checks his saturations and they are usually around 92%.  He continues to use his inhalers and nebulizers.  He only use his nebulizer every other day as it makes him feel "dopey headed".   He has been using his pursed lip breathing when he can remember and  can tell it does make a difference for him.   He is doing well on his respiratory meds.  He has changed [employer] to [facility] and it hasn;t been very long to make a big difference yet.     Goals/Expected Outcomes  Short: Continue to work on PLB at home and in class.   Long: Continue to stay compliant with oxygen use and try not to use it as much when just at home.        Initial Exercise Prescription: Initial Exercise Prescription - 06/08/17 1600      Date of Initial Exercise RX and Referring Provider   Date  06/08/17    Referring Provider  Lottie Rater MD      Treadmill   MPH  0.5    Grade  0    Minutes  15    METs  1.4      NuStep   Level  1    SPM  80    Minutes  15    METs  1.4      Arm Ergometer   Level  1    RPM  50    Minutes  15    METs  1.4      Prescription Details   Frequency (times per week)  3    Duration  Progress to 45 minutes of aerobic exercise without signs/symptoms of physical distress      Intensity   THRR 40-80% of Max Heartrate  103-138    Ratings of Perceived Exertion  11-13    Perceived Dyspnea  0-4      Progression   Progression  Continue to progress workloads to maintain intensity without signs/symptoms of physical distress.      Resistance Training   Training Prescription  Yes    Weight  3 lbs    Reps  10-15       Perform Capillary Blood Glucose checks as needed.  Exercise Prescription Changes: Exercise Prescription Changes    Row Name 06/08/17 1600 06/23/17 1400 07/02/17 1200 07/06/17 1600 07/20/17 1300     Response to Exercise   Blood Pressure (Admit)  134/62  134/74  -  134/70  128/78   Blood Pressure (Exercise)  148/68  148/80  -  132/76  -   Blood Pressure (Exit)  140/60  112/60  -  129/64  132/80   Heart Rate (Admit)  68 bpm  87 bpm  -  66 bpm  65 bpm   Heart Rate (Exercise)  91 bpm  104 bpm  -  82 bpm  73 bpm   Heart Rate (Exit)  71 bpm  77 bpm  -  69 bpm  68 bpm   Oxygen Saturation (Admit)  99 %  94 %  -  100 %  94 %   Oxygen Saturation (Exercise)  91 %  93 %  -  96 %  98 %   Oxygen Saturation (Exit)  98 %  94 %  -  99 %  98 %   Rating of Perceived Exertion (Exercise)  17  17  -  13  15   Perceived Dyspnea (Exercise)  4  4  -  2  3.5   Symptoms  SOB, knee pain 10/10,  overall fatigued, used rollator  SOB, knee pain  -  SOB, knee pain, hip pain  SOB, knee pain, hip pain   Comments  walk test results  first full day of exercise  -  -  -   Duration  -  Progress to 45 minutes of aerobic exercise without signs/symptoms of physical distress  -  Progress to 45 minutes of aerobic exercise without signs/symptoms of physical distress  Progress to 45 minutes of aerobic exercise without signs/symptoms of physical distress   Intensity  -  THRR unchanged  -  THRR unchanged  THRR unchanged     Progression   Progression  -  Continue to progress workloads to maintain intensity without signs/symptoms of physical distress.  -  Continue to progress workloads to maintain intensity without signs/symptoms of physical distress.  Continue to progress workloads to maintain intensity without signs/symptoms of physical distress.   Average METs  -  1.72  -  2  2.1     Resistance Training   Training Prescription  -  Yes  -  Yes  Yes   Weight  -  3 lbs  -  3 lbs  3 lbs   Reps  -  10-15  -  10-15  10-15     Interval Training   Interval Training  -  No  -  No  No     Treadmill   MPH  -  1.1  -  -  -   Grade  -  0  -  -  -   Minutes  -  15  -  -  -   METs  -  1.84  -  -  -     NuStep   Level  -  1  -  2  2   SPM  -  67  -  70  80   Minutes  -  15  -  15  15   METs  -  1.6  -  1.9  2.1     Arm Ergometer   Level  -  -  -  1  1   RPM  -  -  -  41  39   Minutes  -  -  -  15  15   METs  -  -  -  2.1  2.1     Home Exercise Plan   Plans to continue exercise at  -  -  Home (comment) chair exercises  Home (comment) chair exercises  Home (comment) chair exercises   Frequency  -  -  Add 2 additional days to program exercise sessions.  Add 2 additional days to program exercise sessions.  Add 2 additional days to program exercise sessions.   Initial Home Exercises Provided  -  -  07/02/17  07/02/17  07/02/17   Row Name 08/05/17 1400             Response to Exercise   Blood  Pressure (Admit)  100/60       Blood Pressure (Exit)  90/60       Heart Rate (Admit)  69 bpm       Heart Rate (Exercise)  72 bpm       Heart Rate (Exit)  65 bpm       Oxygen Saturation (Admit)  96 %       Oxygen Saturation (Exercise)  98 %       Oxygen Saturation (Exit)  98 %       Rating of Perceived Exertion (Exercise)  16       Perceived Dyspnea (Exercise)  3       Duration  Progress to 45 minutes of aerobic exercise without signs/symptoms of physical distress       Intensity  THRR unchanged         Progression   Progression  Continue to progress workloads to maintain intensity without signs/symptoms of physical distress.       Average METs  2         Resistance Training   Training Prescription  Yes       Weight  3 lb       Reps  10-15         Interval Training   Interval Training  No         Arm Ergometer   Level  1       RPM  40       Minutes  15       METs  1.9         Home Exercise Plan   Plans to continue exercise at  Home (comment) chair exercises       Frequency  Add 2 additional days to program exercise sessions.       Initial Home Exercises Provided  07/02/17          Exercise Comments: Exercise Comments    Row Name 06/23/17 1131           Exercise Comments   First full day of exercise!  Patient was oriented to gym and equipment including functions, settings, policies, and procedures.  Patient's individual exercise prescription and treatment plan were reviewed.  All starting workloads were established based on the results of the 6 minute walk test done at initial orientation visit.  The plan for exercise progression was also introduced and progression will be customized based on patient's performance and goals.          Exercise Goals and Review: Exercise Goals    Row Name 06/08/17 1608             Exercise Goals   Increase Physical Activity  Yes       Intervention  Provide advice, education, support and counseling about physical  activity/exercise needs.;Develop an individualized exercise prescription for aerobic and resistive training based on initial evaluation findings, risk stratification, comorbidities and participant's personal goals.       Expected Outcomes  Short Term: Attend rehab on a regular basis to increase amount of physical activity.;Long Term: Add in home exercise to make exercise part of routine and to increase amount of physical activity.;Long Term: Exercising regularly at least 3-5 days a week.       Increase Strength and Stamina  Yes       Intervention  Provide advice, education, support and counseling about physical activity/exercise needs.;Develop an individualized exercise prescription for aerobic and resistive training based on initial evaluation findings, risk stratification, comorbidities and participant's personal goals.       Expected Outcomes  Short Term: Perform resistance training exercises routinely during rehab and add in resistance training at home;Long Term: Improve cardiorespiratory fitness, muscular endurance and strength as measured by increased METs and functional capacity (6MWT);Short Term: Increase workloads from initial exercise prescription for resistance, speed, and METs.       Able to understand and use rate of perceived exertion (RPE) scale  Yes       Intervention  Provide education and explanation on how to use RPE scale       Expected Outcomes  Short Term: Able to use RPE daily in rehab to express subjective intensity level;Long Term:  Able to use RPE to guide intensity level when exercising independently       Able to understand and use Dyspnea scale  Yes       Intervention  Provide education and explanation on how to use Dyspnea scale       Expected Outcomes  Short Term: Able to use Dyspnea scale daily in rehab to express subjective sense of shortness of breath during exertion;Long Term: Able to use Dyspnea scale to guide intensity level when exercising independently        Knowledge and understanding of Target Heart Rate Range (THRR)  Yes       Intervention  Provide education and explanation of THRR including how the numbers were predicted and where they are located for reference       Expected Outcomes  Short Term: Able to state/look up THRR;Short Term: Able to use daily as guideline for intensity in rehab;Long Term: Able to use THRR to govern intensity when exercising independently       Able to check pulse independently  Yes       Intervention  Provide education and demonstration on how to check pulse in carotid and radial arteries.;Review the importance of being able to check your own pulse for safety during independent exercise       Expected Outcomes  Short Term: Able to explain why pulse checking is important during independent exercise;Long Term: Able to check pulse independently and accurately       Understanding of Exercise Prescription  Yes       Intervention  Provide education, explanation, and written materials on patient's individual exercise prescription  Expected Outcomes  Short Term: Able to explain program exercise prescription;Long Term: Able to explain home exercise prescription to exercise independently          Exercise Goals Re-Evaluation : Exercise Goals Re-Evaluation    Row Name 06/23/17 1131 07/02/17 1230 07/06/17 1602 07/14/17 1218 08/05/17 1459     Exercise Goal Re-Evaluation   Exercise Goals Review  Increase Physical Activity;Increase Strength and Stamina;Understanding of Exercise Prescription;Knowledge and understanding of Target Heart Rate Range (THRR);Able to understand and use rate of perceived exertion (RPE) scale  Increase Physical Activity;Able to understand and use Dyspnea scale;Understanding of Exercise Prescription;Increase Strength and Stamina;Knowledge and understanding of Target Heart Rate Range (THRR);Able to understand and use rate of perceived exertion (RPE) scale;Able to check pulse independently  Increase Physical  Activity;Increase Strength and Stamina;Understanding of Exercise Prescription  Increase Physical Activity;Increase Strength and Stamina;Understanding of Exercise Prescription  Increase Physical Activity;Increase Strength and Stamina;Able to understand and use Dyspnea scale;Able to understand and use rate of perceived exertion (RPE) scale   Comments  Reviewed RPE scale, THR and program prescription with pt today.  Pt voiced understanding and was given a copy of goals to take home.   Reviewed home exercise with pt today.  Pt plans to do chair exercises at home for exercise.  Reviewed THR, pulse, RPE, sign and symptoms, NTG use, and when to call 911 or MD.  Also discussed weather considerations and indoor options.  Pt voiced understanding.  Ernest Sims was only able to do seated exercise at last visit since his hip was still bothering. We told him that we would work with his issues and will do as much walking as he is able.  He is up to level 2 on the NuStep.  We will continue to monitor his progression.   Ernest Sims has been doing fairly well in rehab.  He did get an aspiration shot in hip yesterday that has left him a little sore.  He is trying to do his chair exercises at home.  He doing them two days a week for 5 min each hour.  He is going to try to build to 30 min all at once.   Ernest Sims is reaching RPE goals but not HR.  Staff will montior progress.   Expected Outcomes  Short: Use RPE daily to regulate intensity.  Long: Follow program prescription in [facility].  Short: Start with 2 days for 8mn each.  Long: Continue to exercise independently  Short: Increase workload on arm crank and NuStep.  Long: Continue to be more physically active.   Short: Increase exercise time at home to build to 33m. 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  Short - Pt will continue to attend regularly Long - Pt will increase overall MET level   Row Name 08/18/17 1147             Exercise Goal Re-Evaluation   Comments  Out since last  review          Discharge Exercise Prescription (Final Exercise Prescription Changes): Exercise Prescription Changes - 08/05/17 1400      Response to Exercise   Blood Pressure (Admit)  100/60    Blood Pressure (Exit)  90/60    Heart Rate (Admit)  69 bpm    Heart Rate (Exercise)  72 bpm    Heart Rate (Exit)  65 bpm    Oxygen Saturation (Admit)  96 %    Oxygen Saturation (Exercise)  98 %    Oxygen Saturation (  Exit)  98 %    Rating of Perceived Exertion (Exercise)  16    Perceived Dyspnea (Exercise)  3    Duration  Progress to 45 minutes of aerobic exercise without signs/symptoms of physical distress    Intensity  THRR unchanged      Progression   Progression  Continue to progress workloads to maintain intensity without signs/symptoms of physical distress.    Average METs  2      Resistance Training   Training Prescription  Yes    Weight  3 lb    Reps  10-15      Interval Training   Interval Training  No      Arm Ergometer   Level  1    RPM  40    Minutes  15    METs  1.9      Home Exercise Plan   Plans to continue exercise at  Home (comment) chair exercises    Frequency  Add 2 additional days to program exercise sessions.    Initial Home Exercises Provided  07/02/17       Nutrition:  Target Goals: Understanding of nutrition guidelines, daily intake of sodium <1562m, cholesterol <205m calories 30% from fat and 7% or less from saturated fats, daily to have 5 or more servings of fruits and vegetables.  Biometrics: Pre Biometrics - 06/08/17 1609      Pre Biometrics   Height  5' 7.6" (1.717 m)    Weight  363 lb 12.8 oz (165 kg)  (Abnormal)     Waist Circumference  52 inches    Hip Circumference  64 inches    Waist to Hip Ratio  0.81 %    BMI (Calculated)  55.97        Nutrition Therapy Plan and Nutrition Goals: Nutrition Therapy & Goals - 07/21/17 1130      Nutrition Therapy   Diet  TLC    Protein (specify units)  13oz    Fiber  30 grams    Whole  Grain Foods  3 servings    Saturated Fats  16 max. grams    Fruits and Vegetables  4 servings/day 8 ideal; does not eat many fruits at this time    Sodium  1200 grams      Personal Nutrition Goals   Nutrition Goal  Look for ways to minimize the amount of salt you consume daily, using the guidelines and handouts provided.    Personal Goal #2  Add in low salt/ no salt snacks such as raw vegetables, yogurt and fruit rather than chips and crackers.    Personal Goal #3  Work with your wife to cook more dinners at home, rather than eating out or choosing pre-packaged meals.    Comments  He has been restricting his salt intake x1 week and c/o food having no taste. Traditionally his diet has been very high in salt, snack foods, and red / processed meats      Intervention Plan   Intervention  Prescribe, educate and counsel regarding individualized specific dietary modifications aiming towards targeted core components such as weight, hypertension, lipid management, diabetes, heart failure and other comorbidities.;Nutrition handout(s) given to patient. Spice it up without salt and following a low sodium diet handouts provided    Expected Outcomes  Short Term Goal: Understand basic principles of dietary content, such as calories, fat, sodium, cholesterol and nutrients.;Short Term Goal: A plan has been developed with personal nutrition goals set during dietitian appointment.;Long  Term Goal: Adherence to prescribed nutrition plan.       Nutrition Assessments: Nutrition Assessments - 06/23/17 1456      MEDFICTS Scores   Pre Score  105       Nutrition Goals Re-Evaluation: Nutrition Goals Re-Evaluation    Row Name 07/14/17 1224 07/21/17 1139           Goals   Nutrition Goal  Ernest Sims has met with [place] nutritionist, but is scheudled to meet with Ernest Sims next Wednesday at 59  Look for ways to minimize the amount of salt you consume daily, using the guidelines and handouts provided.      Comment  [facility] and they were impressed with how he was doing with weight loss, but he still needs to lose more.  He will meet with Ernest Sims next week.   He has been following a low sodium diet x1 week and c/o food having no taste. He has been eating slightly less d/t not liking many of his usual foods now that they don't have salt added to them. He has been instructed per MD not to include any salt in his diet.      Expected Outcome  Short: Meet with dietician.  Long: Continue to work on weight loss.   He will choose salt-free herbs, spices and other seasonings, along with learning to read nutrition facts labels to identify low sodium foods/ condiments/ dressings/ etc.        Personal Goal #2 Re-Evaluation   Personal Goal #2  -  Add in low salt / salt-free snacks such as raw vegetables, yogurt and fruit rather than chips and crackers.        Personal Goal #3 Re-Evaluation   Personal Goal #3  -  Work with your wife to cook more dinners at home rather than eating out or choosing pre-packaged meals         Nutrition Goals Discharge (Final Nutrition Goals Re-Evaluation): Nutrition Goals Re-Evaluation - 07/21/17 1139      Goals   Nutrition Goal  Look for ways to minimize the amount of salt you consume daily, using the guidelines and handouts provided.    Comment  He has been following a low sodium diet x1 week and c/o food having no taste. He has been eating slightly less d/t not liking many of his usual foods now that they don't have salt added to them. He has been instructed per MD not to include any salt in his diet.    Expected Outcome  He will choose salt-free herbs, spices and other seasonings, along with learning to read nutrition facts labels to identify low sodium foods/ condiments/ dressings/ etc.      Personal Goal #2 Re-Evaluation   Personal Goal #2  Add in low salt / salt-free snacks such as raw vegetables, yogurt and fruit rather than chips and crackers.      Personal Goal #3  Re-Evaluation   Personal Goal #3  Work with your wife to cook more dinners at home rather than eating out or choosing pre-packaged meals       Psychosocial: Target Goals: Acknowledge presence or absence of significant depression and/or stress, maximize coping skills, provide positive support system. Participant is able to verbalize types and ability to use techniques and skills needed for reducing stress and depression.   Initial Review & Psychosocial Screening: Initial Psych Review & Screening - 06/08/17 1453      Initial Review  Current issues with  Current Sleep Concerns;Current Stress Concerns    Source of Stress Concerns  Chronic Illness    Comments  His breathing stresses him out.      Family Dynamics   Good Support System?  Yes    Comments  He can look to his wife and daughter for support      Barriers   Psychosocial barriers to participate in program  The patient should benefit from training in stress management and relaxation.      Screening Interventions   Interventions  Encouraged to exercise;Provide feedback about the scores to participant;Program counselor consult;To provide support and resources with identified psychosocial needs    Expected Outcomes  Short Term goal: Utilizing psychosocial counselor, staff and physician to assist with identification of specific Stressors or current issues interfering with healing process. Setting desired goal for each stressor or current issue identified.;Long Term Goal: Stressors or current issues are controlled or eliminated.;Long Term goal: The participant improves quality of Life and PHQ9 Scores as seen by post scores and/or verbalization of changes;Short Term goal: Identification and review with participant of any Quality of Life or Depression concerns found by scoring the questionnaire.       Quality of Life Scores:  Scores of 19 and below usually indicate a poorer quality of life in these areas.  A difference of  2-3 points is a  clinically meaningful difference.  A difference of 2-3 points in the total score of the Quality of Life Index has been associated with significant improvement in overall quality of life, self-image, physical symptoms, and general health in studies assessing change in quality of life.  PHQ-9: Recent Review Flowsheet Data    Depression screen [facility] 2/9 06/08/2017   Decreased Interest 1   Down, Depressed, Hopeless 1   PHQ - 2 Score 2   Altered sleeping 1   Tired, decreased energy 1   Change in appetite 2   Feeling bad or failure about yourself  0   Trouble concentrating 0   Moving slowly or fidgety/restless 1   Suicidal thoughts 0   PHQ-9 Score 7   Difficult doing work/chores Somewhat difficult     Interpretation of Total Score  Total Score Depression Severity:  1-4 = Minimal depression, 5-9 = Mild depression, 10-14 = Moderate depression, 15-19 = Moderately severe depression, 20-27 = Severe depression   Psychosocial Evaluation and Intervention:   Psychosocial Re-Evaluation: Psychosocial Re-Evaluation    Gillespie Name 07/14/17 1234             Psychosocial Re-Evaluation   Current issues with  Current Stress Concerns;Current Sleep Concerns       Comments  Ernest Sims's biggest stressor has been his health and his other comorbidities.  Besides his pulmonary issues he also has hip and back pain that limit his ability to do things around the house.   He does have a strong support system in wife and daughter.  On average he sleeps sporadiacly and attended sleep class today. His sleep has improved since starting the program.  He is now able to sleep in the bed some.  He has found class to be helpful       Expected Outcomes  Short: Meet with counselor.  Long: Continue to cope with his pulmonary disease.        Interventions  Stress management education;Encouraged to attend Pulmonary Rehabilitation for the exercise;Relaxation education       Continue Psychosocial Services   Follow up required by counselor  Comments  His breathing stresses him out.         Initial Review   Source of Stress Concerns  Chronic Illness;Unable to participate in former interests or hobbies;Unable to perform yard/household activities          Psychosocial Discharge (Final Psychosocial Re-Evaluation): Psychosocial Re-Evaluation - 07/14/17 1234      Psychosocial Re-Evaluation   Current issues with  Current Stress Concerns;Current Sleep Concerns    Comments  Ernest Sims's biggest stressor has been his health and his other comorbidities.  Besides his pulmonary issues he also has hip and back pain that limit his ability to do things around the house.   He does have a strong support system in wife and daughter.  On average he sleeps sporadiacly and attended sleep class today. His sleep has improved since starting the program.  He is now able to sleep in the bed some.  He has found class to be helpful    Expected Outcomes  Short: Meet with counselor.  Long: Continue to cope with his pulmonary disease.     Interventions  Stress management education;Encouraged to attend Pulmonary Rehabilitation for the exercise;Relaxation education    Continue Psychosocial Services   Follow up required by counselor    Comments  His breathing stresses him out.      Initial Review   Source of Stress Concerns  Chronic Illness;Unable to participate in former interests or hobbies;Unable to perform yard/household activities       Education: Education Goals: Education classes will be provided on a weekly basis, covering required topics. Participant will state understanding/return demonstration of topics presented.  Learning Barriers/Preferences: Learning Barriers/Preferences - 06/08/17 1457      Learning Barriers/Preferences   Learning Barriers  Sight wears glasses    Learning Preferences  None       Education Topics:  Initial Evaluation Education: - Verbal, written and demonstration of respiratory meds, oximetry and breathing  techniques. Instruction on use of nebulizers and MDIs and importance of monitoring MDI activations.   Pulmonary Rehab from 08/02/2017 in [facility] Cardiac and Pulmonary Rehab  Date  06/08/17  Educator  [facility]  Instruction Review Code  1- Verbalizes Understanding      General Nutrition Guidelines/Fats and Fiber: -Group instruction provided by verbal, written material, models and posters to present the general guidelines for heart healthy nutrition. Gives an explanation and review of dietary fats and fiber.   Pulmonary Rehab from 08/02/2017 in [facility] Cardiac and Pulmonary Rehab  Date  07/19/17  Educator  CR  Instruction Review Code  1- Verbalizes Understanding      Controlling Sodium/Reading Food Labels: -Group verbal and written material supporting the discussion of sodium use in heart healthy nutrition. Review and explanation with models, verbal and written materials for utilization of the food label.   Pulmonary Rehab from 08/02/2017 in [facility] Cardiac and Pulmonary Rehab  Date  08/02/17  Educator  CR  Instruction Review Code  1- Verbalizes Understanding      Exercise Physiology & General Exercise Guidelines: - Group verbal and written instruction with models to review the exercise physiology of the cardiovascular system and associated critical values. Provides general exercise guidelines with specific guidelines to those with heart or lung disease.    Aerobic Exercise & Resistance Training: - Gives group verbal and written instruction on the various components of exercise. Focuses on aerobic and resistive training programs and the benefits of this training and how to safely progress through these programs.   Pulmonary Rehab  from 08/02/2017 in [facility] Cardiac and Pulmonary Rehab  Date  06/25/17  Educator  [facility]  Instruction Review Code  1- Verbalizes Understanding      Flexibility, Balance, Mind/Body Relaxation: Provides group verbal/written instruction on the benefits of flexibility and balance  training, including mind/body exercise modes such as yoga, pilates and tai chi.  Demonstration and skill practice provided.   Pulmonary Rehab from 08/02/2017 in [facility] Cardiac and Pulmonary Rehab  Date  07/07/17  Educator  AS  Instruction Review Code  1- Verbalizes Understanding      Stress and Anxiety: - Provides group verbal and written instruction about the health risks of elevated stress and causes of high stress.  Discuss the correlation between heart/lung disease and anxiety and treatment options. Review healthy ways to manage with stress and anxiety.   Pulmonary Rehab from 08/02/2017 in [facility] Cardiac and Pulmonary Rehab  Date  07/28/17  Educator  [facility]  Instruction Review Code  1- Verbalizes Understanding      Depression: - Provides group verbal and written instruction on the correlation between heart/lung disease and depressed mood, treatment options, and the stigmas associated with seeking treatment.   Pulmonary Rehab from 08/02/2017 in [facility] Cardiac and Pulmonary Rehab  Date  07/14/17  Educator  [facility]  Instruction Review Code  1- Verbalizes Understanding      Exercise & Equipment Safety: - Individual verbal instruction and demonstration of equipment use and safety with use of the equipment.   Pulmonary Rehab from 08/02/2017 in [facility] Cardiac and Pulmonary Rehab  Date  06/08/17  Educator  [facility]  Instruction Review Code  1- Verbalizes Understanding      Infection Prevention: - Provides verbal and written material to individual with discussion of infection control including proper hand washing and proper equipment cleaning during exercise session.   Pulmonary Rehab from 08/02/2017 in [facility] Cardiac and Pulmonary Rehab  Date  06/08/17  Educator  [facility]  Instruction Review Code  1- Verbalizes Understanding      Falls Prevention: - Provides verbal and written material to individual with discussion of falls prevention and safety.   Pulmonary Rehab from 08/02/2017 in [facility] Cardiac and Pulmonary Rehab    Date  06/08/17  Educator  [facility]  Instruction Review Code  1- Verbalizes Understanding      Diabetes: - Individual verbal and written instruction to review signs/symptoms of diabetes, desired ranges of glucose level fasting, after meals and with exercise. Advice that pre and post exercise glucose checks will be done for 3 sessions at entry of program.   Chronic Lung Diseases: - Group verbal and written instruction to review updates, respiratory medications, advancements in procedures and treatments. Discuss use of supplemental oxygen including available portable oxygen systems, continuous and intermittent flow rates, concentrators, personal use and safety guidelines. Review proper use of inhaler and spacers. Provide informative websites for self-education.    Energy Conservation: - Provide group verbal and written instruction for methods to conserve energy, plan and organize activities. Instruct on pacing techniques, use of adaptive equipment and posture/positioning to relieve shortness of breath.   Triggers and Exacerbations: - Group verbal and written instruction to review types of environmental triggers and ways to prevent exacerbations. Discuss weather changes, air quality and the benefits of nasal washing. Review warning signs and symptoms to help prevent infections. Discuss techniques for effective airway clearance, coughing, and vibrations.   Pulmonary Rehab from 08/02/2017 in [facility] Cardiac and Pulmonary Rehab  Date  06/23/17  Educator  [facility]  Instruction Review  Code  3- Needs Reinforcement [late arrival]      AED/CPR: - Group verbal and written instruction with the use of models to demonstrate the basic use of the AED with the basic ABC's of resuscitation.   Anatomy and Physiology of the Lungs: - Group verbal and written instruction with the use of models to provide basic lung anatomy and physiology related to function, structure and complications of lung disease.   Anatomy &  Physiology of the Heart: - Group verbal and written instruction and models provide basic cardiac anatomy and physiology, with the coronary electrical and arterial systems. Review of Valvular disease and Heart Failure   Cardiac Medications: - Group verbal and written instruction to review commonly prescribed medications for heart disease. Reviews the medication, class of the drug, and side effects.   Know Your Numbers and Risk Factors: -Group verbal and written instruction about important numbers in your health.  Discussion of what are risk factors and how they play a role in the disease process.  Review of Cholesterol, Blood Pressure, Diabetes, and BMI and the role they play in your overall health.   Sleep Hygiene: -Provides group verbal and written instruction about how sleep can affect your health.  Define sleep hygiene, discuss sleep cycles and impact of sleep habits. Review good sleep hygiene tips.    Other: -Provides group and verbal instruction on various topics (see comments)    Knowledge Questionnaire Score: Knowledge Questionnaire Score - 06/08/17 1457      Knowledge Questionnaire Score   Pre Score  13/18 reviewed with patient        Core Components/Risk Factors/Patient Goals at Admission: Personal Goals and Risk Factors at Admission - 06/08/17 1500      Core Components/Risk Factors/Patient Goals on Admission    Weight Management  Yes;Obesity;Weight Loss    Intervention  Weight Management: Develop a combined nutrition and exercise program designed to reach desired caloric intake, while maintaining appropriate intake of nutrient and fiber, sodium and fats, and appropriate energy expenditure required for the weight goal.;Weight Management: Provide education and appropriate resources to help participant work on and attain dietary goals.;Obesity: Provide education and appropriate resources to help participant work on and attain dietary goals.;Weight Management/Obesity:  Establish reasonable short term and long term weight goals.    Admit Weight  363 lb 12.8 oz (165 kg)    Goal Weight: Short Term  355 lb (161 kg)    Goal Weight: Long Term  260 lb (117.9 kg)    Expected Outcomes  Short Term: Continue to assess and modify interventions until short term weight is achieved;Long Term: Adherence to nutrition and physical activity/exercise program aimed toward attainment of established weight goal;Weight Maintenance: Understanding of the daily nutrition guidelines, which includes 25-35% calories from fat, 7% or less cal from saturated fats, less than 235m cholesterol, less than 1.5gm of sodium, & 5 or more servings of fruits and vegetables daily;Understanding recommendations for meals to include 15-35% energy as protein, 25-35% energy from fat, 35-60% energy from carbohydrates, less than 2053mof dietary cholesterol, 20-35 gm of total fiber daily;Weight Loss: Understanding of general recommendations for a balanced deficit meal plan, which promotes 1-2 lb weight loss per week and includes a negative energy balance of [phone] kcal/d;Understanding of distribution of calorie intake throughout the day with the consumption of 4-5 meals/snacks    Improve shortness of breath with ADL's  Yes    Intervention  Provide education, individualized exercise plan and daily activity instruction to help decrease  symptoms of SOB with activities of daily living.    Expected Outcomes  Short Term: Improve cardiorespiratory fitness to achieve a reduction of symptoms when performing ADLs;Long Term: Be able to perform more ADLs without symptoms or delay the onset of symptoms    Hypertension  Yes    Intervention  Provide education on lifestyle modifcations including regular physical activity/exercise, weight management, moderate sodium restriction and increased consumption of fresh fruit, vegetables, and low fat dairy, alcohol moderation, and smoking cessation.;Monitor prescription use compliance.     Expected Outcomes  Short Term: Continued assessment and intervention until BP is < 140/16m HG in hypertensive participants. < 130/860mHG in hypertensive participants with diabetes, heart failure or chronic kidney disease.;Long Term: Maintenance of blood pressure at goal levels.    Lipids  Yes    Intervention  Provide education and support for participant on nutrition & aerobic/resistive exercise along with prescribed medications to achieve LDL <7050mHDL >24m14m  Expected Outcomes  Short Term: Participant states understanding of desired cholesterol values and is compliant with medications prescribed. Participant is following exercise prescription and nutrition guidelines.;Long Term: Cholesterol controlled with medications as prescribed, with individualized exercise RX and with personalized nutrition plan. Value goals: LDL < 70mg50mL > 40 mg.       Core Components/Risk Factors/Patient Goals Review:  Goals and Risk Factor Review    Row Name 07/14/17 1220             Core Components/Risk Factors/Patient Goals Review   Personal Goals Review  Weight Management/Obesity;Improve shortness of breath with ADL's;Hypertension;Lipids       Review  Ernest Sims Ernest Noblebeen doing well in rehab. He is down overall and eating better. [facility] and they were impressed with his weight loss.  Blood pressures have been good in the progream.  He has not been checking his pressure at home as he is not sure his wife has a cuff at home.  He is going to look for the cuff or get a new one.   His SOB is getting better, he still runs out of breath but it is improving.        Expected Outcomes  Short: Get or find blood pressure to check at home.  Long; Continue to work on weight loss.           Core Components/Risk Factors/Patient Goals at Discharge (Final Review):  Goals and Risk Factor Review - 07/14/17 1220      Core Components/Risk Factors/Patient Goals Review   Personal Goals Review  Weight  Management/Obesity;Improve shortness of breath with ADL's;Hypertension;Lipids    Review  Ernest Sims Ernest Noblebeen doing well in rehab. He is down overall and eating better. [facility] and [facility] were impressed with his weight loss.  Blood pressures have been good in the progream.  He has not been checking his pressure at home as he is not sure his wife has a cuff at home.  He is going to look for the cuff or get a new one.   His SOB is getting better, he still runs out of breath but it is improving.     Expected Outcomes  Short: Get or find blood pressure to check at home.  Long; Continue to work on weight loss.        ITP Comments: ITP Comments    Row Name 06/08/17 1419 06/30/17 1540 07/05/17 0854 08/02/17 0841 08/18/17 1521   ITP Comments  Medical Evaluation completed. Chart sent for review and changes to Dr. Emily Filbert Director of [place]. Diagnosis can be found in [facility] encounter 05/06/17  Ernest Sims arrived early for rehab today and asked if he could just do seated equipment.  I told him that we could work on that. He is having an evaluation tomorrow for a possible redo hip replacement.  He was not able to stay for exercise today since he was wearing flip flops.  He will let us know the plan after tomorrow's visit.    30 day review completed. ITP sent to Dr. Emily Filbert Director of [place]. Continue with ITP unless changes are made by physician   30 day review completed. ITP sent to Dr. Emily Filbert Director of [place]. Continue with ITP unless changes are made by physician  Called to check on pt.  Left message on voicemail.  Out since 08/02/17   Row Name 08/23/17 1618 08/23/17 1619         ITP Comments  Ernest Sims stated he has pinched a nerve and is unable to walk and move properly. He gets an injection in two weeks and would like to be discharged from the progam. Informed him when he is able to come back and will need a referral from his physician. Patient verbalizes understanding.  Discharge  ITP sent and signed by Dr. Sabra Heck.  Discharge Summary routed to PCP and pulmonologist.         Comments: Discharge ITP

## 2017-08-25 NOTE — Progress Notes (Signed)
Nutrition consultation completed see notes

## 2017-09-01 ENCOUNTER — Other Ambulatory Visit: Payer: Self-pay | Admitting: Orthopaedic Surgery

## 2017-09-01 ENCOUNTER — Ambulatory Visit
Admission: RE | Admit: 2017-09-01 | Discharge: 2017-09-01 | Disposition: A | Discharge status: Home / Self Care | Payer: Self-pay | Source: Ambulatory Visit | Attending: Orthopaedic Surgery | Admitting: Orthopaedic Surgery

## 2017-09-01 ENCOUNTER — Ambulatory Visit
Admission: RE | Admit: 2017-09-01 | Discharge: 2017-09-01 | Disposition: A | Discharge status: Home / Self Care | Payer: BLUE CROSS/BLUE SHIELD | Source: Ambulatory Visit | Attending: Orthopaedic Surgery | Admitting: Orthopaedic Surgery

## 2017-09-01 ENCOUNTER — Encounter: Payer: Self-pay | Admitting: Interventional Radiology

## 2017-09-01 DIAGNOSIS — M79604 Pain in right leg: Secondary | ICD-10-CM | POA: Insufficient documentation

## 2017-09-01 DIAGNOSIS — M5136 Other intervertebral disc degeneration, lumbar region: Secondary | ICD-10-CM

## 2017-09-01 DIAGNOSIS — M5126 Other intervertebral disc displacement, lumbar region: Secondary | ICD-10-CM | POA: Insufficient documentation

## 2017-09-01 HISTORY — PX: IR FLUORO GUIDED NEEDLE PLC ASPIRATION/INJECTION LOC: IMG2395

## 2017-09-01 MED ORDER — LIDOCAINE HCL (PF) 1 % IJ SOLN
INTRAMUSCULAR | Status: AC
  Filled 2017-09-01: qty 30

## 2017-09-01 MED ORDER — SODIUM CHLORIDE 0.9 % IJ SOLN
INTRAMUSCULAR | Status: AC
Start: 2017-09-01 — End: 2017-09-01
  Filled 2017-09-01: qty 50

## 2017-09-01 MED ORDER — MIDAZOLAM HCL 2 MG/ML PO SYRP
8.0000 mg | ORAL_SOLUTION | Freq: Once | ORAL | Status: AC
  Administered 2017-09-01: 13:00:00 8 mg via ORAL
  Filled 2017-09-01: qty 4

## 2017-09-01 MED ORDER — IOPAMIDOL (ISOVUE-M 200) INJECTION 41%
10.0000 mL | Freq: Once | INTRAMUSCULAR | Status: AC
  Administered 2017-09-01: 14:00:00 10 mL via EPIDURAL

## 2017-09-01 MED ORDER — MIDAZOLAM HCL 2 MG/ML PO SYRP
ORAL_SOLUTION | ORAL | Status: AC
  Filled 2017-09-01: qty 4

## 2017-09-01 MED ORDER — METHYLPREDNISOLONE ACETATE 80 MG/ML IJ SUSP
INTRAMUSCULAR | Status: AC
  Filled 2017-09-01: qty 2

## 2017-09-01 MED ORDER — SODIUM CHLORIDE 0.9 % IV SOLN: INTRAVENOUS | Status: DC

## 2017-12-23 ENCOUNTER — Other Ambulatory Visit (HOSPITAL_COMMUNITY): Payer: Self-pay | Admitting: Interventional Radiology

## 2017-12-31 ENCOUNTER — Ambulatory Visit (INDEPENDENT_AMBULATORY_CARE_PROVIDER_SITE_OTHER): Payer: BLUE CROSS/BLUE SHIELD | Admitting: Nurse Practitioner

## 2017-12-31 ENCOUNTER — Encounter (INDEPENDENT_AMBULATORY_CARE_PROVIDER_SITE_OTHER): Payer: Self-pay | Admitting: Nurse Practitioner

## 2017-12-31 VITALS — BP 132/79 | HR 83 | Resp 16 | Ht 70.0 in | Wt 287.8 lb

## 2017-12-31 DIAGNOSIS — I10 Essential (primary) hypertension: Secondary | ICD-10-CM

## 2017-12-31 DIAGNOSIS — I872 Venous insufficiency (chronic) (peripheral): Secondary | ICD-10-CM

## 2017-12-31 DIAGNOSIS — K219 Gastro-esophageal reflux disease without esophagitis: Secondary | ICD-10-CM

## 2017-12-31 DIAGNOSIS — Z87891 Personal history of nicotine dependence: Secondary | ICD-10-CM

## 2018-01-04 ENCOUNTER — Encounter (INDEPENDENT_AMBULATORY_CARE_PROVIDER_SITE_OTHER): Payer: Self-pay | Admitting: Nurse Practitioner

## 2018-01-04 NOTE — Progress Notes (Signed)
Subjective:    Patient ID: Ernest Sims, male    DOB: Mar 31, 1955, 62 y.o.   MRN: [id number] Chief Complaint  Patient presents with  . Follow-up    43monthfollow up    HPI  MKWAMAINE CUPPETTis a 62y.o. male that returns to the office for followup evaluation regarding leg swelling.  The swelling has persisted but with the lymph pump the patient states the swelling is much better controlled. The pain associated with swelling is essentially eliminated. There have not been any interval development of a ulcerations or wounds.  No episodes of cellulitis or infection over the past 12 months  The patient denies problems with the pump, noting it is working well and the leggings are in good condition.  Since the previous visit the patient has been wearing graduated compression stockings and using the lymph pump on a routine basis and  has noted significant improvement in the lymphedema.   Patient stated the lymph pump has been a very positive factor in his  care.      Past Medical History:  Diagnosis Date  . Anxiety   . Arthritis   . Colitis   . COPD (chronic obstructive pulmonary disease) (HCC)    2.5 L O2 AT NIGHT  . Crohn disease ([place]   . Diverticulosis   . Dyspnea    OFTEN  . GERD (gastroesophageal reflux disease)   . Hypercholesteremia   . Hypertension   . Migraines   . Motion sickness   . Orthopnea    sleeps in chair  . Restless leg syndrome   . Seasonal allergies     Past Surgical History:  Procedure Laterality Date  . CATARACT EXTRACTION W/PHACO Right 07/22/2016   Procedure: CATARACT EXTRACTION PHACO AND INTRAOCULAR LENS PLACEMENT (IP[place] RIGHT;  Surgeon: BLeandrew Koyanagi MD;  [place] [facility]  Service: Ophthalmology;  Laterality: Right;  IVA TOPICAL RIGHT TORIC  . CATARACT EXTRACTION W/PHACO Left 08/05/2016   Procedure: CATARACT EXTRACTION PHACO AND INTRAOCULAR LENS PLACEMENT ([place]  left toric lens;  Surgeon: BLeandrew Koyanagi MD;  [place]  Service: Ophthalmology;  Laterality: Left;  Toric lens  . COLONOSCOPY WITH PROPOFOL N/A 02/28/2015   Procedure: COLONOSCOPY WITH PROPOFOL;  Surgeon: RManya Silvas MD;  [facility]  Service: Endoscopy;  Laterality: N/A;  . ESOPHAGOGASTRODUODENOSCOPY (EGD) WITH PROPOFOL N/A 02/28/2015   Procedure: ESOPHAGOGASTRODUODENOSCOPY (EGD) WITH PROPOFOL;  Surgeon: RManya Silvas MD;  [facility];  Service: Endoscopy;  Laterality: N/A;  . IR FLUORO GUIDED NEEDLE PLC ASPIRATION/INJECTION LOC  09/01/2017  . JOINT REPLACEMENT  2002   hip  . PILONIDAL CYST EXCISION  1971  . REVISION TOTAL HIP ARTHROPLASTY  2003  . ROTATOR CUFF REPAIR  2016  . ruptured disc  1984    Social History   Socioeconomic History  . Marital status: Married    Spouse name: Not on file  . Number of children: Not on file  . Years of education: Not on file  . Highest education level: Not on file  Occupational History  . Not on file  Social Needs  . Financial resource strain: Not on file  . Food insecurity:    Worry: Not on file    Inability: Not on file  . Transportation needs:    Medical: Not on file    Non-medical: Not on file  Tobacco Use  . Smoking status: Former Smoker    Packs/day: 3.00    Years: 24.00  Pack years: 72.00    Types: Cigarettes    Last attempt to quit: 03/08/1988    Years since quitting: 29.8  . Smokeless tobacco: Never Used  Substance and Sexual Activity  . Alcohol use: Yes    Alcohol/week: 0.0 standard drinks    Comment: occasional  . Drug use: Not on file  . Sexual activity: Not on file  Lifestyle  . Physical activity:    Days per week: Not on file    Minutes per session: Not on file  . Stress: Not on file  Relationships  . Social connections:    Talks on phone: Not on file    Gets together: Not on file    Attends religious service: Not on file    Active member of club or organization: Not on file    Attends meetings of clubs or  organizations: Not on file    Relationship status: Not on file  . Intimate partner violence:    Fear of current or ex partner: Not on file    Emotionally abused: Not on file    Physically abused: Not on file    Forced sexual activity: Not on file  Other Topics Concern  . Not on file  Social History Narrative  . Not on file    Family History  Problem Relation Age of Onset  . Lung disease Father   . Heart disease Father   . Lung disease Mother   . Cancer Brother        liver  . Cancer Maternal Aunt     Allergies  Allergen Reactions  . Etodolac Nausea And Vomiting  . Penicillins Other (See Comments)    Has patient had a PCN reaction causing immediate rash, facial/tongue/throat swelling, SOB or lightheadedness with hypotension: No Has patient had a PCN reaction causing severe rash involving mucus membranes or skin necrosis: No Has patient had a PCN reaction that required hospitalization: No Has patient had a PCN reaction occurring within the last 10 years: No If all of the above answers are "NO", then may proceed with Cephalosporin use.   . Iodinated Diagnostic Agents Itching    Contrast dye  . Tape Rash    Paper tape caues rash     Review of Systems   Review of Systems: Negative Unless Checked Constitutional: [] Weight loss  [] Fever  [] Chills Cardiac: [] Chest pain   []  Atrial Fibrillation  [] Palpitations   [] Shortness of breath when laying flat   [] Shortness of breath with exertion. Vascular:  [] Pain in legs with walking   [] Pain in legs with standing  [] History of DVT   [] Phlebitis   [x] Swelling in legs   [] Varicose veins   [] Non-healing ulcers Pulmonary:   [] Uses home oxygen   [] Productive cough   [] Hemoptysis   [] Wheeze  [] COPD   [] Asthma Neurologic:  [] Dizziness   [] Seizures   [] History of stroke   [] History of TIA  [] Aphasia   [] Vissual changes   [] Weakness or numbness in arm   [x] Weakness or numbness in leg Musculoskeletal:   [] Joint swelling   [] Joint pain   [] Low  back pain  []  History of Knee Replacement Hematologic:  [] Easy bruising  [] Easy bleeding   [] Hypercoagulable state   [] Anemic Gastrointestinal:  [] Diarrhea   [] Vomiting  [] Gastroesophageal reflux/heartburn   [] Difficulty swallowing. Genitourinary:  [] Chronic kidney disease   [] Difficult urination  [] Anuric   [] Blood in urine Skin:  [] Rashes   [] Ulcers  Psychological:  [] History of anxiety   []  History  of major depression  []  Memory Difficulties     Objective:   Physical Exam  BP 132/79 (BP Location: Right Arm)   Pulse 83   Resp 16   Ht 5' 10"  (1.778 m)   Wt 287 lb 12.8 oz (130.5 kg)   BMI 41.30 kg/m   Gen: WD/WN, NAD Head: [place]/AT, No temporalis wasting.  Ear/Nose/Throat: Hearing grossly intact, nares w/o erythema or drainage Eyes: PER, EOMI, sclera nonicteric.  Neck: Supple, no masses.  No JVD.  Pulmonary:  Good air movement, no use of accessory muscles.  Cardiac: RRR Vascular:  Very minimal swelling bilaterally Vessel Right Left  Radial Palpable Palpable   Gastrointestinal: soft, non-distended. No guarding/no peritoneal signs.  Musculoskeletal: Patient using walker for ambulation No deformity or atrophy.  Neurologic: Pain and light touch intact in extremities.  Symmetrical.  Speech is fluent. Motor exam as listed above. Psychiatric: Judgment intact, Mood & affect appropriate for pt's clinical situation. Dermatologic: No Venous rashes. No Ulcers Noted.  No changes consistent with cellulitis. Lymph : No Cervical lymphadenopathy, no lichenification or skin changes of chronic lymphedema.      Assessment & Plan:   1. Chronic venous insufficiency  No surgery or intervention at this point in time.    I have reviewed my previous discussion with the patient regarding swelling and why it  causes symptoms.  The patient is doing well with compression and will continue wearing graduated compression stockings class 1 (20-30 mmHg) on a daily basis a prescription was given. The patient  will  continue wearing the stockings first thing in the morning and removing them in the evening. The patient is instructed specifically not to sleep in the stockings.    In addition, behavioral modification including elevation during the day and exercise will be continued.    Patient should follow-up on an annual basis   2. Essential hypertension Continue antihypertensive medications as already ordered, these medications have been reviewed and there are no changes at this time.   3. Gastroesophageal reflux disease, esophagitis presence not specified Continue PPI as already ordered, this medication has been reviewed and there are no changes at this time.  Avoidence of caffeine and alcohol  Moderate elevation of the head of the bed    Current Outpatient Medications on File Prior to Visit  Medication Sig Dispense Refill  . albuterol (PROVENTIL HFA;VENTOLIN HFA) 108 (90 Base) MCG/ACT inhaler Inhale 2 puffs into the lungs every 6 (six) hours as needed for wheezing or shortness of breath. 1 Inhaler 2  . allopurinol (ZYLOPRIM) 300 MG tablet Take 300 mg by mouth as needed.     Marland Kitchen amitriptyline (ELAVIL) 25 MG tablet Take 25 mg by mouth at bedtime.    Marland Kitchen aspirin EC 81 MG EC tablet Take 1 tablet (81 mg total) by mouth daily. (Patient taking differently: Take 81 mg by mouth 2 (two) times daily. )    . diazepam (VALIUM) 10 MG tablet Take 1 tablet (10 mg total) by mouth every 8 (eight) hours as needed for anxiety. 30 tablet 0  . furosemide (LASIX) 40 MG tablet Take 40 mg by mouth daily.    Marland Kitchen latanoprost (XALATAN) 0.005 % ophthalmic solution Apply to eye.    . metaxalone (SKELAXIN) 800 MG tablet Take 800 mg by mouth.    Marland Kitchen omeprazole (PRILOSEC) 40 MG capsule Take 40 mg by mouth 2 (two) times daily.     Marland Kitchen oxyCODONE (OXY IR/ROXICODONE) 5 MG immediate release tablet Take 2 tablets (10 mg  total) by mouth every 4 (four) hours as needed for moderate pain or severe pain. 30 tablet 0  . oxyCODONE-acetaminophen  (ROXICET) 5-325 MG tablet Take 1 tablet by mouth every 6 (six) hours as needed for severe pain. 8 tablet 0  . OXYGEN Inhale 2.5 L into the lungs as needed.    . pramipexole (MIRAPEX) 0.5 MG tablet Take 0.5 mg by mouth 2 (two) times daily.    . pravastatin (PRAVACHOL) 40 MG tablet Take 40 mg by mouth at bedtime.     . predniSONE (STERAPRED UNI-PAK 21 TAB) 10 MG (21) TBPK tablet Take by mouth daily. Dispense steroid taper pack as directed 21 tablet 0  . propranolol (INDERAL) 40 MG tablet Take 40 mg by mouth 2 (two) times daily.    . timolol (TIMOPTIC) 0.5 % ophthalmic solution Place 1 drop into both eyes 2 (two) times daily.    Marland Kitchen topiramate (TOPAMAX) 100 MG tablet Take 100 mg by mouth 2 (two) times daily.    Marland Kitchen torsemide (DEMADEX) 20 MG tablet Take 10 mg by mouth.    . triamcinolone (NASACORT) 55 MCG/ACT AERO nasal inhaler Place 2 sprays into the nose 2 (two) times daily as needed.     . zolpidem (AMBIEN) 10 MG tablet Take 1 tablet (10 mg total) by mouth at bedtime as needed for sleep. 10 tablet 0  . levothyroxine (SYNTHROID, LEVOTHROID) 50 MCG tablet Take 50 mcg by mouth daily before breakfast.      No current facility-administered medications on file prior to visit.     There are no Patient Instructions on file for this visit. Return in about 1 year (around 01/01/2019).   Kris Hartmann, NP  This note was completed with Sales executive.  Any errors are purely unintentional.

## 2018-04-19 ENCOUNTER — Inpatient Hospital Stay
Admission: EM | Admit: 2018-04-19 | Discharge: 2018-04-20 | DRG: 641 | Disposition: A | Discharge status: Home / Self Care | Payer: Medicare HMO | Attending: Internal Medicine | Admitting: Internal Medicine

## 2018-04-19 ENCOUNTER — Other Ambulatory Visit: Payer: Self-pay

## 2018-04-19 DIAGNOSIS — E785 Hyperlipidemia, unspecified: Secondary | ICD-10-CM | POA: Diagnosis present

## 2018-04-19 DIAGNOSIS — G2581 Restless legs syndrome: Secondary | ICD-10-CM | POA: Diagnosis present

## 2018-04-19 DIAGNOSIS — Z880 Allergy status to penicillin: Secondary | ICD-10-CM

## 2018-04-19 DIAGNOSIS — E7800 Pure hypercholesterolemia, unspecified: Secondary | ICD-10-CM | POA: Diagnosis present

## 2018-04-19 DIAGNOSIS — M25561 Pain in right knee: Secondary | ICD-10-CM | POA: Diagnosis present

## 2018-04-19 DIAGNOSIS — N179 Acute kidney failure, unspecified: Secondary | ICD-10-CM | POA: Diagnosis present

## 2018-04-19 DIAGNOSIS — E039 Hypothyroidism, unspecified: Secondary | ICD-10-CM | POA: Diagnosis present

## 2018-04-19 DIAGNOSIS — J449 Chronic obstructive pulmonary disease, unspecified: Secondary | ICD-10-CM | POA: Diagnosis present

## 2018-04-19 DIAGNOSIS — E875 Hyperkalemia: Secondary | ICD-10-CM | POA: Diagnosis not present

## 2018-04-19 DIAGNOSIS — H409 Unspecified glaucoma: Secondary | ICD-10-CM | POA: Diagnosis present

## 2018-04-19 DIAGNOSIS — Z79891 Long term (current) use of opiate analgesic: Secondary | ICD-10-CM

## 2018-04-19 DIAGNOSIS — M25562 Pain in left knee: Secondary | ICD-10-CM | POA: Diagnosis present

## 2018-04-19 DIAGNOSIS — G894 Chronic pain syndrome: Secondary | ICD-10-CM | POA: Diagnosis present

## 2018-04-19 DIAGNOSIS — Z800 Family history of malignant neoplasm of digestive organs: Secondary | ICD-10-CM

## 2018-04-19 DIAGNOSIS — Z6841 Body Mass Index (BMI) 40.0 and over, adult: Secondary | ICD-10-CM

## 2018-04-19 DIAGNOSIS — Z79890 Hormone replacement therapy (postmenopausal): Secondary | ICD-10-CM

## 2018-04-19 DIAGNOSIS — K219 Gastro-esophageal reflux disease without esophagitis: Secondary | ICD-10-CM | POA: Diagnosis present

## 2018-04-19 DIAGNOSIS — M542 Cervicalgia: Secondary | ICD-10-CM | POA: Diagnosis present

## 2018-04-19 DIAGNOSIS — F419 Anxiety disorder, unspecified: Secondary | ICD-10-CM | POA: Diagnosis present

## 2018-04-19 DIAGNOSIS — M25531 Pain in right wrist: Secondary | ICD-10-CM | POA: Diagnosis present

## 2018-04-19 DIAGNOSIS — M79641 Pain in right hand: Secondary | ICD-10-CM | POA: Diagnosis present

## 2018-04-19 DIAGNOSIS — K5090 Crohn's disease, unspecified, without complications: Secondary | ICD-10-CM | POA: Diagnosis present

## 2018-04-19 DIAGNOSIS — Z974 Presence of external hearing-aid: Secondary | ICD-10-CM

## 2018-04-19 DIAGNOSIS — Z8249 Family history of ischemic heart disease and other diseases of the circulatory system: Secondary | ICD-10-CM

## 2018-04-19 DIAGNOSIS — N183 Chronic kidney disease, stage 3 (moderate): Secondary | ICD-10-CM | POA: Diagnosis present

## 2018-04-19 DIAGNOSIS — J9611 Chronic respiratory failure with hypoxia: Secondary | ICD-10-CM | POA: Diagnosis present

## 2018-04-19 DIAGNOSIS — M1120 Other chondrocalcinosis, unspecified site: Secondary | ICD-10-CM | POA: Diagnosis present

## 2018-04-19 DIAGNOSIS — I129 Hypertensive chronic kidney disease with stage 1 through stage 4 chronic kidney disease, or unspecified chronic kidney disease: Secondary | ICD-10-CM | POA: Diagnosis present

## 2018-04-19 DIAGNOSIS — Z9981 Dependence on supplemental oxygen: Secondary | ICD-10-CM

## 2018-04-19 DIAGNOSIS — Z7952 Long term (current) use of systemic steroids: Secondary | ICD-10-CM

## 2018-04-19 DIAGNOSIS — Z7951 Long term (current) use of inhaled steroids: Secondary | ICD-10-CM

## 2018-04-19 DIAGNOSIS — G589 Mononeuropathy, unspecified: Secondary | ICD-10-CM | POA: Diagnosis present

## 2018-04-19 DIAGNOSIS — Z87891 Personal history of nicotine dependence: Secondary | ICD-10-CM

## 2018-04-19 DIAGNOSIS — Z888 Allergy status to other drugs, medicaments and biological substances status: Secondary | ICD-10-CM

## 2018-04-19 DIAGNOSIS — Z79899 Other long term (current) drug therapy: Secondary | ICD-10-CM

## 2018-04-19 DIAGNOSIS — Z91048 Other nonmedicinal substance allergy status: Secondary | ICD-10-CM

## 2018-04-19 DIAGNOSIS — Z96649 Presence of unspecified artificial hip joint: Secondary | ICD-10-CM | POA: Diagnosis present

## 2018-04-19 LAB — CBC
HCT: 40.3 % (ref 39.0–52.0)
Hemoglobin: 12.4 g/dL — ABNORMAL LOW (ref 13.0–17.0)
MCH: 29.4 pg (ref 26.0–34.0)
MCHC: 30.8 g/dL (ref 30.0–36.0)
MCV: 95.5 fL (ref 80.0–100.0)
PLATELETS: 312 10*3/uL (ref 150–400)
RBC: 4.22 MIL/uL (ref 4.22–5.81)
RDW: 18 % — AB (ref 11.5–15.5)
WBC: 9 10*3/uL (ref 4.0–10.5)
nRBC: 0 % (ref 0.0–0.2)

## 2018-04-19 LAB — TROPONIN I: Troponin I: <0.03 ng/mL (ref <0.03)

## 2018-04-19 LAB — BASIC METABOLIC PANEL
Anion gap: 11 (ref 5–15)
BUN: 46 mg/dL — ABNORMAL HIGH (ref 8–23)
CALCIUM: 9.4 mg/dL (ref 8.9–10.3)
CO2: 15 mmol/L — AB (ref 22–32)
CREATININE: 1.14 mg/dL (ref 0.61–1.24)
Chloride: 110 mmol/L (ref 98–111)
GFR calc non Af Amer: >60 mL/min (ref >60)
GLUCOSE: 127 mg/dL — AB (ref 70–99)
Potassium: 6.6 mmol/L (ref 3.5–5.1)
Sodium: 136 mmol/L (ref 135–145)

## 2018-04-19 MED ORDER — SODIUM BICARBONATE 8.4 % IV SOLN
100.0000 meq | Freq: Once | INTRAVENOUS | Status: DC
  Filled 2018-04-19: qty 50

## 2018-04-19 MED ORDER — ENOXAPARIN SODIUM 40 MG/0.4ML ~~LOC~~ SOLN
40.0000 mg | Freq: Two times a day (BID) | SUBCUTANEOUS | Status: DC
  Filled 2018-04-19: qty 0.4

## 2018-04-19 MED ORDER — INSULIN ASPART 100 UNIT/ML ~~LOC~~ SOLN
10.0000 [IU] | Freq: Once | SUBCUTANEOUS | Status: AC
  Administered 2018-04-19: 10 [IU] via INTRAVENOUS
  Filled 2018-04-19: qty 1

## 2018-04-19 MED ORDER — ONDANSETRON HCL 4 MG PO TABS: 4.0000 mg | ORAL_TABLET | Freq: Four times a day (QID) | ORAL | Status: DC | PRN

## 2018-04-19 MED ORDER — SODIUM BICARBONATE 650 MG PO TABS
650.0000 mg | ORAL_TABLET | Freq: Four times a day (QID) | ORAL | Status: DC
  Administered 2018-04-20 (×3): 650 mg via ORAL
  Filled 2018-04-19 (×5): qty 1

## 2018-04-19 MED ORDER — PRAVASTATIN SODIUM 20 MG PO TABS
40.0000 mg | ORAL_TABLET | Freq: Every day | ORAL | Status: DC
  Administered 2018-04-20: 40 mg via ORAL
  Filled 2018-04-19: qty 2

## 2018-04-19 MED ORDER — TIMOLOL MALEATE 0.5 % OP SOLN
1.0000 [drp] | Freq: Two times a day (BID) | OPHTHALMIC | Status: DC
  Administered 2018-04-20 (×2): 1 [drp] via OPHTHALMIC
  Filled 2018-04-19: qty 5

## 2018-04-19 MED ORDER — LEVOTHYROXINE SODIUM 50 MCG PO TABS
50.0000 ug | ORAL_TABLET | Freq: Every day | ORAL | Status: DC
  Administered 2018-04-20: 50 ug via ORAL
  Filled 2018-04-19: qty 1

## 2018-04-19 MED ORDER — GABAPENTIN 300 MG PO CAPS
900.0000 mg | ORAL_CAPSULE | Freq: Once | ORAL | Status: AC
  Administered 2018-04-19: 900 mg via ORAL
  Filled 2018-04-19: qty 3

## 2018-04-19 MED ORDER — ACETAMINOPHEN 325 MG PO TABS: 650.0000 mg | ORAL_TABLET | Freq: Four times a day (QID) | ORAL | Status: DC | PRN

## 2018-04-19 MED ORDER — TORSEMIDE 20 MG PO TABS
10.0000 mg | ORAL_TABLET | Freq: Every day | ORAL | Status: DC
  Administered 2018-04-20: 10 mg via ORAL
  Filled 2018-04-19: qty 1

## 2018-04-19 MED ORDER — OXYCODONE-ACETAMINOPHEN 5-325 MG PO TABS: 1.0000 | ORAL_TABLET | Freq: Four times a day (QID) | ORAL | Status: DC | PRN

## 2018-04-19 MED ORDER — PANTOPRAZOLE SODIUM 40 MG PO TBEC
40.0000 mg | DELAYED_RELEASE_TABLET | Freq: Every day | ORAL | Status: DC
  Administered 2018-04-20: 10:00:00 40 mg via ORAL
  Filled 2018-04-19: qty 1

## 2018-04-19 MED ORDER — PRAMIPEXOLE DIHYDROCHLORIDE 0.25 MG PO TABS
0.5000 mg | ORAL_TABLET | Freq: Two times a day (BID) | ORAL | Status: DC
  Administered 2018-04-20: 01:00:00 0.5 mg via ORAL
  Filled 2018-04-19 (×2): qty 2

## 2018-04-19 MED ORDER — ASPIRIN EC 81 MG PO TBEC
162.0000 mg | DELAYED_RELEASE_TABLET | Freq: Every day | ORAL | Status: DC
  Filled 2018-04-19: qty 2

## 2018-04-19 MED ORDER — ALBUTEROL SULFATE (2.5 MG/3ML) 0.083% IN NEBU: 3.0000 mL | INHALATION_SOLUTION | Freq: Four times a day (QID) | RESPIRATORY_TRACT | Status: DC | PRN

## 2018-04-19 MED ORDER — FUROSEMIDE 10 MG/ML IJ SOLN
20.0000 mg | Freq: Once | INTRAMUSCULAR | Status: AC
  Administered 2018-04-19: 20 mg via INTRAVENOUS
  Filled 2018-04-19: qty 4

## 2018-04-19 MED ORDER — ACETAMINOPHEN 650 MG RE SUPP: 650.0000 mg | Freq: Four times a day (QID) | RECTAL | Status: DC | PRN

## 2018-04-19 MED ORDER — DEXTROSE 50 % IV SOLN
25.0000 g | Freq: Once | INTRAVENOUS | Status: AC
  Administered 2018-04-19: 25 g via INTRAVENOUS
  Filled 2018-04-19: qty 50

## 2018-04-19 MED ORDER — SODIUM ZIRCONIUM CYCLOSILICATE 10 G PO PACK
10.0000 g | PACK | Freq: Once | ORAL | Status: AC
  Administered 2018-04-19: 10 g via ORAL
  Filled 2018-04-19: qty 1

## 2018-04-19 MED ORDER — DOCUSATE SODIUM 100 MG PO CAPS
100.0000 mg | ORAL_CAPSULE | Freq: Two times a day (BID) | ORAL | Status: DC
  Filled 2018-04-19 (×2): qty 1

## 2018-04-19 MED ORDER — FUROSEMIDE 40 MG PO TABS: 40.0000 mg | ORAL_TABLET | Freq: Every day | ORAL | Status: DC

## 2018-04-19 MED ORDER — TOPIRAMATE 100 MG PO TABS
100.0000 mg | ORAL_TABLET | Freq: Two times a day (BID) | ORAL | Status: DC
  Administered 2018-04-20 (×2): 100 mg via ORAL
  Filled 2018-04-19 (×3): qty 1

## 2018-04-19 MED ORDER — AMITRIPTYLINE HCL 25 MG PO TABS
25.0000 mg | ORAL_TABLET | Freq: Every day | ORAL | Status: DC
  Administered 2018-04-20: 25 mg via ORAL
  Filled 2018-04-19: qty 1

## 2018-04-19 MED ORDER — HYDRALAZINE HCL 20 MG/ML IJ SOLN: 10.0000 mg | INTRAMUSCULAR | Status: DC | PRN

## 2018-04-19 MED ORDER — OXYCODONE-ACETAMINOPHEN 5-325 MG PO TABS
2.0000 | ORAL_TABLET | Freq: Once | ORAL | Status: AC
  Administered 2018-04-19: 2 via ORAL
  Filled 2018-04-19: qty 2

## 2018-04-19 MED ORDER — OXYCODONE HCL 5 MG PO TABS
10.0000 mg | ORAL_TABLET | ORAL | Status: DC | PRN
  Administered 2018-04-20 (×2): 10 mg via ORAL
  Filled 2018-04-19 (×2): qty 2

## 2018-04-19 MED ORDER — PROPRANOLOL HCL 40 MG PO TABS
40.0000 mg | ORAL_TABLET | Freq: Two times a day (BID) | ORAL | Status: DC
  Administered 2018-04-20: 40 mg via ORAL
  Filled 2018-04-19 (×3): qty 1

## 2018-04-19 MED ORDER — DEXTROSE-NACL 5-0.45 % IV SOLN
INTRAVENOUS | Status: DC
  Administered 2018-04-20: 01:00:00 via INTRAVENOUS

## 2018-04-19 MED ORDER — LATANOPROST 0.005 % OP SOLN
1.0000 [drp] | Freq: Every day | OPHTHALMIC | Status: DC
  Administered 2018-04-20: 1 [drp] via OPHTHALMIC
  Filled 2018-04-19: qty 2.5

## 2018-04-19 MED ORDER — SODIUM CHLORIDE 0.9 % IV BOLUS
500.0000 mL | Freq: Once | INTRAVENOUS | Status: AC
  Administered 2018-04-19: 500 mL via INTRAVENOUS

## 2018-04-19 MED ORDER — DIAZEPAM 5 MG PO TABS: 10.0000 mg | ORAL_TABLET | Freq: Three times a day (TID) | ORAL | Status: DC | PRN

## 2018-04-19 MED ORDER — ONDANSETRON HCL 4 MG/2ML IJ SOLN: 4.0000 mg | Freq: Four times a day (QID) | INTRAMUSCULAR | Status: DC | PRN

## 2018-04-19 NOTE — ED Triage Notes (Addendum)
Pt states here for elevated potassium (6.7 today, 5.7 yesterday). States had EKG performed at [facility]. C/o of pain in leg, hand, wrist, back. A&O, ambulatory with cane. No distress noted.

## 2018-04-19 NOTE — H&P (Signed)
Ernest Sims at [place] NAME: Ernest Sims    MR#:  [id number]  DATE OF BIRTH:  1955/06/25  DATE OF ADMISSION:  04/19/2018  PRIMARY CARE PHYSICIAN: System, Pcp Not In   REQUESTING/REFERRING PHYSICIAN:   CHIEF COMPLAINT:   Chief Complaint  Patient presents with  . Abnormal Lab    Potassium     HISTORY OF PRESENT ILLNESS: Ernest Sims  is a 63 y.o. male with a known history per below, was sent to the emergency room by primary care provider given potassium elevation, patient had potassium level 5.7 yesterday, on recheck it was 6.6 after Lasix use, patient currently without complaint, ED attending did discuss case with nephrology/Dr. Candiss Norse, patient evaluated in the emergency room, wife at the bedside, potassium level 6.6, BUN 46 with normal creatinine, bicarb 15, patient currently receiving Lokelma/Lasix/insulin after dextrose, EKG noted for peaked T waves, patient now been admitted for acute incidentally found hyperkalemia  PAST MEDICAL HISTORY:   Past Medical History:  Diagnosis Date  . Anxiety   . Arthritis   . Colitis   . COPD (chronic obstructive pulmonary disease) (HCC)    2.5 L O2 AT NIGHT  . Crohn disease ([place])   . Diverticulosis   . Dyspnea    OFTEN  . GERD (gastroesophageal reflux disease)   . Hypercholesteremia   . Hypertension   . Migraines   . Motion sickness   . Orthopnea    sleeps in chair  . Restless leg syndrome   . Seasonal allergies     PAST SURGICAL HISTORY:  Past Surgical History:  Procedure Laterality Date  . CATARACT EXTRACTION W/PHACO Right 07/22/2016   Procedure: CATARACT EXTRACTION PHACO AND INTRAOCULAR LENS PLACEMENT ([place]) RIGHT;  Surgeon: Leandrew Koyanagi, MD;  Location: [facility];  Service: Ophthalmology;  Laterality: Right;  IVA TOPICAL RIGHT TORIC  . CATARACT EXTRACTION W/PHACO Left 08/05/2016   Procedure: CATARACT EXTRACTION PHACO AND INTRAOCULAR LENS PLACEMENT ([place])  left toric lens;   Surgeon: Leandrew Koyanagi, MD;  Location: [facility];  Service: Ophthalmology;  Laterality: Left;  Toric lens  . COLONOSCOPY WITH PROPOFOL N/A 02/28/2015   Procedure: COLONOSCOPY WITH PROPOFOL;  Surgeon: Manya Silvas, MD;  Location: [facility] ENDOSCOPY;  Service: Endoscopy;  Laterality: N/A;  . ESOPHAGOGASTRODUODENOSCOPY (EGD) WITH PROPOFOL N/A 02/28/2015   Procedure: ESOPHAGOGASTRODUODENOSCOPY (EGD) WITH PROPOFOL;  Surgeon: Manya Silvas, MD;  Location: [facility] ENDOSCOPY;  Service: Endoscopy;  Laterality: N/A;  . IR FLUORO GUIDED NEEDLE PLC ASPIRATION/INJECTION LOC  09/01/2017  . JOINT REPLACEMENT  2002   hip  . PILONIDAL CYST EXCISION  1971  . REVISION TOTAL HIP ARTHROPLASTY  2003  . ROTATOR CUFF REPAIR  2016  . ruptured disc  1984    SOCIAL HISTORY:  Social History   Tobacco Use  . Smoking status: Former Smoker    Packs/day: 3.00    Years: 24.00    Pack years: 72.00    Types: Cigarettes    Last attempt to quit: 03/08/1988    Years since quitting: 30.1  . Smokeless tobacco: Never Used  Substance Use Topics  . Alcohol use: Yes    Alcohol/week: 0.0 standard drinks    Comment: occasional    FAMILY HISTORY:  Family History  Problem Relation Age of Onset  . Lung disease Father   . Heart disease Father   . Lung disease Mother   . Cancer Brother        liver  . Cancer Maternal  Aunt     DRUG ALLERGIES:  Allergies  Allergen Reactions  . Etodolac Nausea And Vomiting  . Penicillins Other (See Comments)    Has patient had a PCN reaction causing immediate rash, facial/tongue/throat swelling, SOB or lightheadedness with hypotension: No Has patient had a PCN reaction causing severe rash involving mucus membranes or skin necrosis: No Has patient had a PCN reaction that required hospitalization: No Has patient had a PCN reaction occurring within the last 10 years: No If all of the above answers are "NO", then may proceed with Cephalosporin use.   . Iodinated Diagnostic  Agents Itching    Contrast dye  . Tape Rash    Paper tape caues rash    REVIEW OF SYSTEMS:   CONSTITUTIONAL: No fever, fatigue or weakness.  EYES: No blurred or double vision.  EARS, NOSE, AND THROAT: No tinnitus or ear pain.  RESPIRATORY: No cough, shortness of breath, wheezing or hemoptysis.  CARDIOVASCULAR: No chest pain, orthopnea, edema.  GASTROINTESTINAL: No nausea, vomiting, diarrhea or abdominal pain.  GENITOURINARY: No dysuria, hematuria.  ENDOCRINE: No polyuria, nocturia,  HEMATOLOGY: No anemia, easy bruising or bleeding SKIN: No rash or lesion. MUSCULOSKELETAL: No joint pain or arthritis.   NEUROLOGIC: No tingling, numbness, weakness.  PSYCHIATRY: No anxiety or depression.   MEDICATIONS AT HOME:  Prior to Admission medications   Medication Sig Start Date End Date Taking? Authorizing Provider  albuterol (PROVENTIL HFA;VENTOLIN HFA) 108 (90 Base) MCG/ACT inhaler Inhale 2 puffs into the lungs every 6 (six) hours as needed for wheezing or shortness of breath. 04/06/17   Earleen Newport, MD  allopurinol (ZYLOPRIM) 300 MG tablet Take 300 mg by mouth as needed.     [provider]  amitriptyline (ELAVIL) 25 MG tablet Take 25 mg by mouth at bedtime.    [provider]  aspirin EC 81 MG EC tablet Take 1 tablet (81 mg total) by mouth daily. Patient taking differently: Take 81 mg by mouth 2 (two) times daily.  04/29/15   Hillary Bow, MD  diazepam (VALIUM) 10 MG tablet Take 1 tablet (10 mg total) by mouth every 8 (eight) hours as needed for anxiety. 04/29/15   Hillary Bow, MD  furosemide (LASIX) 40 MG tablet Take 40 mg by mouth daily.    [provider]  latanoprost (XALATAN) 0.005 % ophthalmic solution Apply to eye.    [provider]  levothyroxine (SYNTHROID, LEVOTHROID) 50 MCG tablet Take 50 mcg by mouth daily before breakfast.  10/06/16 10/06/17  [provider]  metaxalone (SKELAXIN) 800 MG tablet Take 800 mg by mouth. 02/29/12    [provider]  omeprazole (PRILOSEC) 40 MG capsule Take 40 mg by mouth 2 (two) times daily.     [provider]  oxyCODONE (OXY IR/ROXICODONE) 5 MG immediate release tablet Take 2 tablets (10 mg total) by mouth every 4 (four) hours as needed for moderate pain or severe pain. 04/29/15   Hillary Bow, MD  oxyCODONE-acetaminophen (ROXICET) 5-325 MG tablet Take 1 tablet by mouth every 6 (six) hours as needed for severe pain. 09/15/16   Carrie Mew, MD  OXYGEN Inhale 2.5 L into the lungs as needed.    [provider]  pramipexole (MIRAPEX) 0.5 MG tablet Take 0.5 mg by mouth 2 (two) times daily.    [provider]  pravastatin (PRAVACHOL) 40 MG tablet Take 40 mg by mouth at bedtime.     [provider]  predniSONE (STERAPRED UNI-PAK 21 TAB) 10 MG (  21) TBPK tablet Take by mouth daily. Dispense steroid taper pack as directed 04/06/17   Earleen Newport, MD  propranolol (INDERAL) 40 MG tablet Take 40 mg by mouth 2 (two) times daily. 11/20/13   [provider]  timolol (TIMOPTIC) 0.5 % ophthalmic solution Place 1 drop into both eyes 2 (two) times daily.    [provider]  topiramate (TOPAMAX) 100 MG tablet Take 100 mg by mouth 2 (two) times daily.    [provider]  torsemide (DEMADEX) 20 MG tablet Take 10 mg by mouth. 02/29/12   [provider]  triamcinolone (NASACORT) 55 MCG/ACT AERO nasal inhaler Place 2 sprays into the nose 2 (two) times daily as needed.     [provider]  zolpidem (AMBIEN) 10 MG tablet Take 1 tablet (10 mg total) by mouth at bedtime as needed for sleep. 04/29/15   Hillary Bow, MD      PHYSICAL EXAMINATION:   VITAL SIGNS: Blood pressure (!) 175/77, pulse 83, temperature 98.5 F (36.9 C), temperature source Oral, resp. rate 18, height 5' 7"  (1.702 m), weight 129.3 kg, SpO2 98 %.  GENERAL:  63 y.o.-year-old patient lying in the bed with no acute distress.  EYES: Pupils equal,  round, reactive to light and accommodation. No scleral icterus. Extraocular muscles intact.  HEENT: Head atraumatic, normocephalic. Oropharynx and nasopharynx clear.  NECK:  Supple, no jugular venous distention. No thyroid enlargement, no tenderness.  LUNGS: Normal breath sounds bilaterally, no wheezing, rales,rhonchi or crepitation. No use of accessory muscles of respiration.  CARDIOVASCULAR: S1, S2 normal. No murmurs, rubs, or gallops.  ABDOMEN: Soft, nontender, nondistended. Bowel sounds present. No organomegaly or mass.  EXTREMITIES: No pedal edema, cyanosis, or clubbing.  NEUROLOGIC: Cranial nerves II through XII are intact. Muscle strength 5/5 in all extremities. Sensation intact. Gait not checked.  PSYCHIATRIC: The patient is alert and oriented x 3.  SKIN: No obvious rash, lesion, or ulcer.   LABORATORY PANEL:   CBC Recent Labs  Lab 04/19/18 1717  WBC 9.0  HGB 12.4*  HCT 40.3  PLT 312  MCV 95.5  MCH 29.4  MCHC 30.8  RDW 18.0*   ------------------------------------------------------------------------------------------------------------------  Chemistries  Recent Labs  Lab 04/19/18 1717  NA 136  K 6.6*  CL 110  CO2 15*  GLUCOSE 127*  BUN 46*  CREATININE 1.14  CALCIUM 9.4   ------------------------------------------------------------------------------------------------------------------ estimated creatinine clearance is 83.5 mL/min (by C-G formula based on SCr of 1.14 mg/dL). ------------------------------------------------------------------------------------------------------------------ No results for input(s): TSH, T4TOTAL, T3FREE, THYROIDAB in the last 72 hours.  Invalid input(s): FREET3   Coagulation profile No results for input(s): INR, PROTIME in the last 168 hours. ------------------------------------------------------------------------------------------------------------------- No results for input(s): DDIMER in the last 72  hours. -------------------------------------------------------------------------------------------------------------------  Cardiac Enzymes Recent Labs  Lab 04/19/18 1717  TROPONINI <0.03   ------------------------------------------------------------------------------------------------------------------ Invalid input(s): POCBNP  ---------------------------------------------------------------------------------------------------------------  Urinalysis    Component Value Date/Time   COLORURINE YELLOW (A) 04/27/2015 0410   APPEARANCEUR CLEAR (A) 04/27/2015 0410   LABSPEC 1.021 04/27/2015 0410   PHURINE 5.0 04/27/2015 0410   GLUCOSEU NEGATIVE 04/27/2015 0410   HGBUR NEGATIVE 04/27/2015 0410   BILIRUBINUR NEGATIVE 04/27/2015 0410   KETONESUR NEGATIVE 04/27/2015 0410   PROTEINUR NEGATIVE 04/27/2015 0410   NITRITE NEGATIVE 04/27/2015 0410   LEUKOCYTESUR NEGATIVE 04/27/2015 0410     RADIOLOGY: No results found.  EKG: Orders placed or performed during the hospital encounter of 04/19/18  . ED EKG  . ED EKG  . EKG 12-Lead  .  EKG 12-Lead    IMPRESSION AND PLAN: *Acute incidentally found hyperkalemia Secondary to unknown etiology No improvement with Lasix on outpatient basis Admit to regular nursing for bed, currently ordered for Lokelma, Lasix, dextrose with IV insulin by ED attending, IV fluids for hydration, nephrology to see, BMP in the morning  *Chronic hypertension Continue home regiment, IV hydralazine as needed systolic blood pressure greater than 160, vitals per routine, make changes as per necessary  *Chronic GERD Stable PPI daily  *Chronic hypoxic respiratory failure Stable On 2.5 L at bedtime  *Chronic pain syndrome Stable Continue home regiment  *Chronic morbid obesity Most likely secondary to excess calories Lifestyle modification recommended  *Chronic hyperlipidemia Continue statin therapy  *COPD without exacerbation Breathing treatments  PRN  *Chronic hypothyroidism, unspecified Stable Check TSH and continue Synthroid  *Chronic anxiety disorder Stable Continue home regiment  All the records are reviewed and case discussed with ED provider. Management plans discussed with the patient, family and they are in agreement.  CODE STATUS:full Code Status History    Date Active Date Inactive Code Status Order ID Comments User Context   04/26/2015 2244 04/30/2015 1908 Full Code [id number]  Hillary Bow, MD ED       TOTAL TIME TAKING CARE OF THIS PATIENT: 45 minutes.    Avel Peace  M.D on 04/19/2018   Between 7am to 6pm - Pager - [phone]  After 6pm go to [email or web address] - password [email or web address] [facility]  Office  [phone]  CC: Primary care physician; System, Pcp Not In   Note: This dictation was prepared with Dragon dictation along with smaller phrase technology. Any transcriptional errors that result from this process are unintentional.

## 2018-04-19 NOTE — ED Provider Notes (Signed)
[facility] Emergency Department Provider Note       Time seen: ----------------------------------------- 7:21 PM on 04/19/2018 -----------------------------------------   I have reviewed the triage vital signs and the nursing notes.  HISTORY   Chief Complaint Abnormal Lab (Potassium )   HPI Ernest Sims is a 63 y.o. male with a history of arthritis, colitis, Crohn's, hyperlipidemia, hypertension, migraines who presents to the ED for hyperkalemia.  Patient states his potassium was 6.7 today and 5.7 yesterday.  He had an EKG performed at [facility].  He is complaining of diffuse joint pain.  He denies fevers, chills or other complaints.  Past Medical History:  Diagnosis Date  . Anxiety   . Arthritis   . Colitis   . COPD (chronic obstructive pulmonary disease) (HCC)    2.5 L O2 AT NIGHT  . Crohn disease ([place])   . Diverticulosis   . Dyspnea    OFTEN  . GERD (gastroesophageal reflux disease)   . Hypercholesteremia   . Hypertension   . Migraines   . Motion sickness   . Orthopnea    sleeps in chair  . Restless leg syndrome   . Seasonal allergies     Patient Active Problem List   Diagnosis Date Noted  . Lower limb ulcer, calf ([place]) 12/29/2016  . Chronic venous insufficiency 12/29/2016  . Hypertension 11/24/2016  . Swelling of limb 11/24/2016  . CVA (cerebral infarction) 04/26/2015  . Acid reflux 07/12/2014  . Hoarseness 07/12/2014  . Shortness of breath 05/22/2014  . Morbid obesity ([place]) 05/22/2014  . Physical deconditioning 05/22/2014    Past Surgical History:  Procedure Laterality Date  . CATARACT EXTRACTION W/PHACO Right 07/22/2016   Procedure: CATARACT EXTRACTION PHACO AND INTRAOCULAR LENS PLACEMENT ([place]) RIGHT;  Surgeon: Leandrew Koyanagi, MD;  Location: [place];  Service: Ophthalmology;  Laterality: Right;  IVA TOPICAL RIGHT TORIC  . CATARACT EXTRACTION W/PHACO Left 08/05/2016   Procedure: CATARACT EXTRACTION PHACO  AND INTRAOCULAR LENS PLACEMENT ([place])  left toric lens;  Surgeon: Leandrew Koyanagi, MD;  Location: [facility];  Service: Ophthalmology;  Laterality: Left;  Toric lens  . COLONOSCOPY WITH PROPOFOL N/A 02/28/2015   Procedure: COLONOSCOPY WITH PROPOFOL;  Surgeon: Manya Silvas, MD;  [facility] ENDOSCOPY;  Service: Endoscopy;  Laterality: N/A;  . ESOPHAGOGASTRODUODENOSCOPY (EGD) WITH PROPOFOL N/A 02/28/2015   Procedure: ESOPHAGOGASTRODUODENOSCOPY (EGD) WITH PROPOFOL;  Surgeon: Manya Silvas, MD;  Location: [facility] ENDOSCOPY;  Service: Endoscopy;  Laterality: N/A;  . IR FLUORO GUIDED NEEDLE PLC ASPIRATION/INJECTION LOC  09/01/2017  . JOINT REPLACEMENT  2002   hip  . PILONIDAL CYST EXCISION  1971  . REVISION TOTAL HIP ARTHROPLASTY  2003  . ROTATOR CUFF REPAIR  2016  . ruptured disc  1984    Allergies Etodolac; Penicillins; Iodinated diagnostic agents; and Tape  Social History Social History   Tobacco Use  . Smoking status: Former Smoker    Packs/day: 3.00    Years: 24.00    Pack years: 72.00    Types: Cigarettes    Last attempt to quit: 03/08/1988    Years since quitting: 30.1  . Smokeless tobacco: Never Used  Substance Use Topics  . Alcohol use: Yes    Alcohol/week: 0.0 standard drinks    Comment: occasional  . Drug use: Not on file   Review of Systems Constitutional: Negative for fever. Cardiovascular: Negative for chest pain. Respiratory: Negative for shortness of breath. Gastrointestinal: Negative for abdominal pain, vomiting and diarrhea. Musculoskeletal: Positive for  joint pains Skin: Negative for rash. Neurological: Negative for headaches, focal weakness or numbness.  All systems negative/normal/unremarkable except as stated in the HPI  ____________________________________________   PHYSICAL EXAM:  VITAL SIGNS: ED Triage Vitals  Enc Vitals Group     BP 04/19/18 1714 (!) 175/77     Pulse Rate 04/19/18 1714 83     Resp 04/19/18 1714 18     Temp  04/19/18 1714 98.5 F (36.9 C)     Temp Source 04/19/18 1714 Oral     SpO2 04/19/18 1714 98 %     Weight 04/19/18 1714 285 lb (129.3 kg)     Height 04/19/18 1714 5' 7"  (1.702 m)     Head Circumference --      Peak Flow --      Pain Score 04/19/18 1713 10     Pain Loc --      Pain Edu? --      Excl. in [place]? --    Constitutional: Alert and oriented. Well appearing and in no distress. Eyes: Conjunctivae are normal. Normal extraocular movements. ENT      Head: Normocephalic and atraumatic.      Nose: No congestion/rhinnorhea.      Mouth/Throat: Mucous membranes are moist.      Neck: No stridor. Cardiovascular: Normal rate, regular rhythm. No murmurs, rubs, or gallops. Respiratory: Normal respiratory effort without tachypnea nor retractions. Breath sounds are clear and equal bilaterally. No wheezes/rales/rhonchi. Gastrointestinal: Soft and nontender. Normal bowel sounds Musculoskeletal: Nontender with normal range of motion in extremities. No lower extremity tenderness nor edema. Neurologic:  Normal speech and language. No gross focal neurologic deficits are appreciated.  Skin:  Skin is warm, dry and intact. No rash noted. Psychiatric: Mood and affect are normal. Speech and behavior are normal.  ____________________________________________  EKG: Interpreted by me.  Sinus rhythm with a rate of 82 bpm, normal PR interval, normal QRS, normal QT  ____________________________________________  ED COURSE:  As part of my medical decision making, I reviewed the following data within the [place] History obtained from family if available, nursing notes, old chart and ekg, as well as notes from prior ED visits. Patient presented for hyperkalemia, we will assess with labs and imaging as indicated at this time.   Procedures ____________________________________________   LABS (pertinent positives/negatives)  Labs Reviewed  BASIC METABOLIC PANEL - Abnormal; Notable for the  following components:      Result Value   Potassium 6.6 (*)    CO2 15 (*)    Glucose, Bld 127 (*)    BUN 46 (*)    All other components within normal limits  CBC - Abnormal; Notable for the following components:   Hemoglobin 12.4 (*)    RDW 18.0 (*)    All other components within normal limits  TROPONIN I  CRITICAL CARE Performed by: Laurence Aly   Total critical care time: 30 minutes  Critical care time was exclusive of separately billable procedures and treating other patients.  Critical care was necessary to treat or prevent imminent or life-threatening deterioration.  Critical care was time spent personally by me on the following activities: development of treatment plan with patient and/or surrogate as well as nursing, discussions with consultants, evaluation of patient's response to treatment, examination of patient, obtaining history from patient or surrogate, ordering and performing treatments and interventions, ordering and review of laboratory studies, ordering and review of radiographic studies, pulse oximetry and re-evaluation of patient's condition.  ____________________________________________  DIFFERENTIAL DIAGNOSIS   Medication side effect, potassium ingestion, adrenal insufficiency, renal failure  FINAL ASSESSMENT AND PLAN  Hyperkalemia   Plan: The patient had presented for hyperkalemia. Patient's labs did indicate significant hyperkalemia with a potassium of 6.6.  BUN also appears to be increasing as well.  I have discussed the case with nephrology with no specific etiology being discovered.  We have ordered Lokelma as well as Lasix, fluids, insulin and D50.   Laurence Aly, MD    Note: This note was generated in part or whole with voice recognition software. Voice recognition is usually quite accurate but there are transcription errors that can and very often do occur. I apologize for any typographical errors that were not detected and  corrected.     Earleen Newport, MD 04/19/18 [place]

## 2018-04-19 NOTE — ED Notes (Signed)
Pt stated that he had blood work done yesterday and his doctor called him back today and told him that his potassium was 5.7 and to come to the ED. On arrival to ED, pt potassium is 6.7. pt c/o right arm pain that has been ongoing and has been seen by his PCP for that. Denies feeling SOB.

## 2018-04-19 NOTE — Progress Notes (Signed)
Family Meeting Note  Advance Directive:yes  Today a meeting took place with the Patient.  Patient is able to participate   The following clinical team members were present during this meeting:MD  The following were discussed:Patient's diagnosis: Hyperkalemia, Patient's progosis: Unable to determine and Goals for treatment: Full Code  Additional follow-up to be provided: prn  Time spent during discussion:20 minutes  Gorden Harms, MD

## 2018-04-20 ENCOUNTER — Inpatient Hospital Stay: Payer: Medicare HMO

## 2018-04-20 LAB — URINALYSIS, ROUTINE W REFLEX MICROSCOPIC
Bilirubin Urine: NEGATIVE
Glucose, UA: NEGATIVE mg/dL
Hgb urine dipstick: NEGATIVE
Ketones, ur: NEGATIVE mg/dL
Leukocytes,Ua: NEGATIVE
Nitrite: NEGATIVE
Protein, ur: NEGATIVE mg/dL
Specific Gravity, Urine: 1.008 (ref 1.005–1.030)
pH: 5 (ref 5.0–8.0)

## 2018-04-20 LAB — TSH: TSH: 1.56 u[IU]/mL (ref 0.350–4.500)

## 2018-04-20 LAB — HEPATIC FUNCTION PANEL
ALT: 8 U/L (ref 0–44)
AST: 12 U/L — ABNORMAL LOW (ref 15–41)
Albumin: 3.3 g/dL — ABNORMAL LOW (ref 3.5–5.0)
Alkaline Phosphatase: 52 U/L (ref 38–126)
Bilirubin, Direct: <0.1 mg/dL (ref 0.0–0.2)
Total Bilirubin: 0.6 mg/dL (ref 0.3–1.2)
Total Protein: 6.5 g/dL (ref 6.5–8.1)

## 2018-04-20 LAB — BASIC METABOLIC PANEL
ANION GAP: 8 (ref 5–15)
BUN: 48 mg/dL — ABNORMAL HIGH (ref 8–23)
CO2: 20 mmol/L — ABNORMAL LOW (ref 22–32)
Calcium: 8.8 mg/dL — ABNORMAL LOW (ref 8.9–10.3)
Chloride: 112 mmol/L — ABNORMAL HIGH (ref 98–111)
Creatinine, Ser: 1.39 mg/dL — ABNORMAL HIGH (ref 0.61–1.24)
GFR calc Af Amer: >60 mL/min (ref >60)
GFR, EST NON AFRICAN AMERICAN: 53 mL/min — AB (ref >60)
Glucose, Bld: 119 mg/dL — ABNORMAL HIGH (ref 70–99)
Potassium: 4.6 mmol/L (ref 3.5–5.1)
Sodium: 140 mmol/L (ref 135–145)

## 2018-04-20 LAB — PROTEIN / CREATININE RATIO, URINE
CREATININE, URINE: 44 mg/dL
Total Protein, Urine: <6 mg/dL

## 2018-04-20 LAB — LACTIC ACID, PLASMA: Lactic Acid, Venous: 1 mmol/L (ref 0.5–1.9)

## 2018-04-20 MED ORDER — TOPIRAMATE 100 MG PO TABS: 100.0000 mg | ORAL_TABLET | Freq: Two times a day (BID) | ORAL | 0 refills | Status: AC

## 2018-04-20 MED ORDER — PROPRANOLOL HCL 40 MG PO TABS: 40.0000 mg | ORAL_TABLET | Freq: Two times a day (BID) | ORAL | 0 refills | Status: AC

## 2018-04-20 MED ORDER — ROPINIROLE HCL 0.25 MG PO TABS
0.2500 mg | ORAL_TABLET | Freq: Two times a day (BID) | ORAL | Status: DC
  Administered 2018-04-20: 12:00:00 0.25 mg via ORAL
  Filled 2018-04-20 (×2): qty 1

## 2018-04-20 MED ORDER — SODIUM CHLORIDE 0.9 % IV BOLUS
500.0000 mL | Freq: Once | INTRAVENOUS | Status: AC
  Administered 2018-04-20: 05:00:00 500 mL via INTRAVENOUS

## 2018-04-20 MED ORDER — SODIUM BICARBONATE 8.4 % IV SOLN
Freq: Once | INTRAVENOUS | Status: AC
  Administered 2018-04-20: 01:00:00 via INTRAVENOUS
  Filled 2018-04-20: qty 250

## 2018-04-20 MED ORDER — SODIUM CHLORIDE 0.9 % IV SOLN
INTRAVENOUS | Status: DC
  Administered 2018-04-20: 15:00:00 via INTRAVENOUS

## 2018-04-20 NOTE — Progress Notes (Signed)
[facility] [facility] [place] NAME: Ernest Sims    MR#:  [id number]  DATE OF BIRTH:  12/17/1955  SUBJECTIVE:   Patient states he is feeling completely fine today.  He has never had any symptoms.  He denies any weakness or palpitations.  REVIEW OF SYSTEMS:  Review of Systems  Constitutional: Negative for chills and fever.  HENT: Negative for congestion and sore throat.   Eyes: Negative for blurred vision and double vision.  Respiratory: Negative for cough and shortness of breath.   Cardiovascular: Negative for chest pain and palpitations.  Gastrointestinal: Negative for nausea and vomiting.  Genitourinary: Negative for dysuria and urgency.  Musculoskeletal: Negative for back pain and neck pain.  Neurological: Negative for dizziness and headaches.  Psychiatric/Behavioral: Negative for depression. The patient is not nervous/anxious.     DRUG ALLERGIES:   Allergies  Allergen Reactions  . Etodolac Nausea And Vomiting  . Penicillins Other (See Comments)    Has patient had a PCN reaction causing immediate rash, facial/tongue/throat swelling, SOB or lightheadedness with hypotension: No Has patient had a PCN reaction causing severe rash involving mucus membranes or skin necrosis: No Has patient had a PCN reaction that required hospitalization: No Has patient had a PCN reaction occurring within the last 10 years: No If all of the above answers are "NO", then may proceed with Cephalosporin use.   . Iodinated Diagnostic Agents Itching    Contrast dye  . Tape Rash    Paper tape caues rash   VITALS:  Blood pressure (!) 94/59, pulse (!) 58, temperature 97.8 F (36.6 C), temperature source Axillary, resp. rate 11, height 5' 7"  (1.702 m), weight 129.3 kg, SpO2 97 %. PHYSICAL EXAMINATION:  Physical Exam  GENERAL:  63 y.o.-year-old patient lying in the bed with no acute distress.  EYES: Pupils equal, round, reactive to light and accommodation. No  scleral icterus. Extraocular muscles intact.  HEENT: Head atraumatic, normocephalic. Oropharynx and nasopharynx clear.  NECK:  Supple, no jugular venous distention. No thyroid enlargement, no tenderness.  LUNGS: Normal breath sounds bilaterally, no wheezing, rales,rhonchi or crepitation. No use of accessory muscles of respiration.  CARDIOVASCULAR: RRR, S1, S2 normal. No murmurs, rubs, or gallops.  ABDOMEN: Soft, nontender, nondistended. Bowel sounds present. No organomegaly or mass.  EXTREMITIES: No pedal edema, cyanosis, or clubbing.  NEUROLOGIC: Cranial nerves II through XII are intact. Muscle strength 5/5 in all extremities. Sensation intact. Gait not checked.  PSYCHIATRIC: The patient is alert and oriented x 3.  SKIN: No obvious rash, lesion, or ulcer.  LABORATORY PANEL:  Male CBC Recent Labs  Lab 04/19/18 1717  WBC 9.0  HGB 12.4*  HCT 40.3  PLT 312   ------------------------------------------------------------------------------------------------------------------ Chemistries  Recent Labs  Lab 04/20/18 0453  NA 140  K 4.6  CL 112*  CO2 20*  GLUCOSE 119*  BUN 48*  CREATININE 1.39*  CALCIUM 8.8*  AST 12*  ALT 8  ALKPHOS 52  BILITOT 0.6   RADIOLOGY:  No results found. ASSESSMENT AND PLAN:   Acute hyperkalemia- unclear etiology. Hyperkalemia has resolved with Lasix, insulin, Lokelma.  Creatinine bumped from 1.1 > 1.39, but per chart review, his baseline appears to be 1.1-1.3. -Nephrology consulted -UA, urine protein-creatinine ratio, renal ultrasound ordered  Chronic hypertension- blood pressures have been on the low side -We will need to hold propranolol if BPs continue to be soft  Chronic GERD- stable -PPI daily  Chronic pain syndrome- stable -Continue home pain meds  Hyperlipidemia -Continue statin  COPD without exacerbation- uses 2.5 L O2 at night. -Breathing treatments PRN  Chronic hypothyroidism- stable.  TSH normal this admission. -Continue  Synthroid  All the records are reviewed and case discussed with Care Management/Social Worker. Management plans discussed with the patient, family and they are in agreement.  CODE STATUS: Full Code  TOTAL TIME TAKING CARE OF THIS PATIENT: 40 minutes.   More than 50% of the time was spent in counseling/coordination of care: YES  POSSIBLE D/C IN 1-2 DAYS, DEPENDING ON CLINICAL CONDITION.   Berna Spare Mayo M.D on 04/20/2018 at 12:18 PM  Between 7am to 6pm - Pager - [phone]  After 6pm go to [email or web address] - Proofreader  [facility]  Office  [phone]  CC: Primary care physician; System, Pcp Not In  Note: This dictation was prepared with Dragon dictation along with smaller phrase technology. Any transcriptional errors that result from this process are unintentional.

## 2018-04-20 NOTE — Progress Notes (Signed)
Pt is d/ced home.  Had renal U/S today and it was WNL.  Urinalysis also WNL.  Pt's BP has come up - will caution him about propranolol if BP is low and encourage him to take his BP meds.  Pt will f/u with Dr. Candiss Norse and have labs drawn next week.  Removed IV and Pt will go home with wife.

## 2018-04-20 NOTE — Consult Note (Signed)
Date: 04/20/2018                  Patient Name:  Ernest Sims  MRN: [id number]  DOB: 1955-12-20  Age / Sex: 63 y.o., male         PCP: System, Pcp Not In                 Service Requesting Consult: IM/ Mayo, Pete Pelt, MD                 Reason for Consult: Hyperkalemia            History of Present Illness: Patient is a 63 y.o. male with medical problems of COPD, GERD, HTN, who was admitted to [facility] on 04/19/2018 for evaluation of Hyperkalemia.  Patient has multiple baseline health problems.  He reports COPD and uses oxygen as needed at home.  He has glaucoma and wears hearing aids.  He takes medication for hypothyroidism.  He had cardiac catheterization in the remote past.  Reports that no intervention was required.  He also has Crohn's disease and diverticulitis.  Recently he has been dealing with pain in his right hand and wrist.  He has been treated for presumed pseudogout.  He also has "pinched nerve" in the back which causes severe back pain radiating to the right leg.  He also has arthritic pain in his knees.  His neck pain is his most pressing problem.  He is being evaluated for neuropathy of the feet  He presented to the hospital for elevated potassium.  He was given shifting measures.  Potassium level improved to 4.6 this morning.  Creatinine increased slightly to 1.4.  Albumin is noted to be low at 3.3.  Patient otherwise feels well and wants to go home.  Review of sources of potassium in his diet suggest that he was using salt substitutes and eating potatoes on a regular basis    Medications: Outpatient medications: Medications Prior to Admission  Medication Sig Dispense Refill Last Dose  . albuterol (PROVENTIL HFA;VENTOLIN HFA) 108 (90 Base) MCG/ACT inhaler Inhale 2 puffs into the lungs every 6 (six) hours as needed for wheezing or shortness of breath. 1 Inhaler 2 Unknown at PRN  . Fluticasone-Umeclidin-Vilant (TRELEGY ELLIPTA) 100-62.5-25 MCG/INH AEPB Inhale 1 puff into  the lungs daily.   04/19/2018 at 0830  . furosemide (LASIX) 40 MG tablet Take 40 mg by mouth daily.   04/19/2018 at 0830  . gabapentin (NEURONTIN) 300 MG capsule Take 900 mg by mouth 3 (three) times daily.   04/19/2018 at 0830  . latanoprost (XALATAN) 0.005 % ophthalmic solution Place 1 drop into both eyes daily.    04/19/2018 at 0830  . levothyroxine (SYNTHROID, LEVOTHROID) 50 MCG tablet Take 50 mcg by mouth daily before breakfast.    04/19/2018 at 0700  . Melatonin 10 MG TABS Take 10 mg by mouth at bedtime as needed (sleep).   Unknown at PRN  . metaxalone (SKELAXIN) 800 MG tablet Take 800 mg by mouth at bedtime.    04/18/2018 at 2100  . omeprazole (PRILOSEC) 40 MG capsule Take 40 mg by mouth 2 (two) times daily.    04/19/2018 at 0830  . OXYGEN Inhale 2.5 L into the lungs as needed.   Taking  . pravastatin (PRAVACHOL) 40 MG tablet Take 40 mg by mouth daily.    04/19/2018 at 0830  . predniSONE (DELTASONE) 10 MG tablet Take 10 mg by mouth daily with breakfast.   04/19/2018  at 0830  . rOPINIRole (REQUIP) 0.25 MG tablet Take 0.25 mg by mouth 2 (two) times daily.   04/19/2018 at 0830  . timolol (TIMOPTIC) 0.5 % ophthalmic solution Place 1 drop into both eyes at bedtime.    04/18/2018 at 2100  . topiramate (TOPAMAX) 50 MG tablet Take 50 mg by mouth 2 (two) times daily.    04/19/2018 at 0830  . zolpidem (AMBIEN) 10 MG tablet Take 10 mg by mouth at bedtime.   04/18/2018 at 2100  . aspirin EC 81 MG EC tablet Take 1 tablet (81 mg total) by mouth daily. (Patient not taking: Reported on 04/19/2018)   Not Taking at Unknown time  . diazepam (VALIUM) 10 MG tablet Take 1 tablet (10 mg total) by mouth every 8 (eight) hours as needed for anxiety. (Patient not taking: Reported on 04/19/2018) 30 tablet 0 Not Taking at Unknown time  . oxyCODONE (OXY IR/ROXICODONE) 5 MG immediate release tablet Take 2 tablets (10 mg total) by mouth every 4 (four) hours as needed for moderate pain or severe pain. (Patient not taking: Reported on  04/19/2018) 30 tablet 0 Not Taking at Unknown time  . oxyCODONE-acetaminophen (ROXICET) 5-325 MG tablet Take 1 tablet by mouth every 6 (six) hours as needed for severe pain. (Patient not taking: Reported on 04/19/2018) 8 tablet 0 Not Taking at Unknown time  . predniSONE (STERAPRED UNI-PAK 21 TAB) 10 MG (21) TBPK tablet Take by mouth daily. Dispense steroid taper pack as directed (Patient not taking: Reported on 04/19/2018) 21 tablet 0 Not Taking at Unknown time    Current medications: Current Facility-Administered Medications  Medication Dose Route Frequency Provider Last Rate Last Dose  . acetaminophen (TYLENOL) tablet 650 mg  650 mg Oral Q6H PRN Salary, Montell D, MD       Or  . acetaminophen (TYLENOL) suppository 650 mg  650 mg Rectal Q6H PRN Salary, Montell D, MD      . albuterol (PROVENTIL) (2.5 MG/3ML) 0.083% nebulizer solution 3 mL  3 mL Inhalation Q6H PRN Salary, Montell D, MD      . amitriptyline (ELAVIL) tablet 25 mg  25 mg Oral QHS Salary, Montell D, MD   25 mg at 04/20/18 0035  . aspirin EC tablet 162 mg  162 mg Oral Daily Salary, Montell D, MD      . dextrose 5 %-0.45 % sodium chloride infusion   Intravenous Continuous Salary, Montell D, MD 75 mL/hr at 04/20/18 0040    . diazepam (VALIUM) tablet 10 mg  10 mg Oral Q8H PRN Salary, Montell D, MD      . docusate sodium (COLACE) capsule 100 mg  100 mg Oral BID Salary, Montell D, MD      . enoxaparin (LOVENOX) injection 40 mg  40 mg Subcutaneous BID Salary, Montell D, MD      . hydrALAZINE (APRESOLINE) injection 10 mg  10 mg Intravenous Q4H PRN Salary, Montell D, MD      . latanoprost (XALATAN) 0.005 % ophthalmic solution 1 drop  1 drop Both Eyes QHS Salary, Montell D, MD   1 drop at 04/20/18 0109  . levothyroxine (SYNTHROID, LEVOTHROID) tablet 50 mcg  50 mcg Oral QAC breakfast Salary, Holly Bodily D, MD   50 mcg at 04/20/18 0817  . ondansetron (ZOFRAN) tablet 4 mg  4 mg Oral Q6H PRN Salary, Montell D, MD       Or  . ondansetron (ZOFRAN)  injection 4 mg  4 mg Intravenous Q6H PRN Salary, Montell D,  MD      . oxyCODONE (Oxy IR/ROXICODONE) immediate release tablet 10 mg  10 mg Oral Q4H PRN Salary, Montell D, MD   10 mg at 04/20/18 0944  . oxyCODONE-acetaminophen (PERCOCET/ROXICET) 5-325 MG per tablet 1 tablet  1 tablet Oral Q6H PRN Salary, Montell D, MD      . pantoprazole (PROTONIX) EC tablet 40 mg  40 mg Oral Daily Salary, Montell D, MD   40 mg at 04/20/18 0947  . pravastatin (PRAVACHOL) tablet 40 mg  40 mg Oral QHS Salary, Montell D, MD   40 mg at 04/20/18 0035  . propranolol (INDERAL) tablet 40 mg  40 mg Oral BID Loney Hering D, MD   40 mg at 04/20/18 0109  . rOPINIRole (REQUIP) tablet 0.25 mg  0.25 mg Oral BID Mayo, Pete Pelt, MD      . sodium bicarbonate tablet 650 mg  650 mg Oral QID Loney Hering D, MD   650 mg at 04/20/18 0945  . timolol (TIMOPTIC) 0.5 % ophthalmic solution 1 drop  1 drop Both Eyes BID Salary, Holly Bodily D, MD   1 drop at 04/20/18 0948  . topiramate (TOPAMAX) tablet 100 mg  100 mg Oral BID Loney Hering D, MD   100 mg at 04/20/18 0945  . torsemide (DEMADEX) tablet 10 mg  10 mg Oral Daily Salary, Montell D, MD   10 mg at 04/20/18 0947      Allergies: Allergies  Allergen Reactions  . Etodolac Nausea And Vomiting  . Penicillins Other (See Comments)    Has patient had a PCN reaction causing immediate rash, facial/tongue/throat swelling, SOB or lightheadedness with hypotension: No Has patient had a PCN reaction causing severe rash involving mucus membranes or skin necrosis: No Has patient had a PCN reaction that required hospitalization: No Has patient had a PCN reaction occurring within the last 10 years: No If all of the above answers are "NO", then may proceed with Cephalosporin use.   . Iodinated Diagnostic Agents Itching    Contrast dye  . Tape Rash    Paper tape caues rash      Past Medical History: Past Medical History:  Diagnosis Date  . Anxiety   . Arthritis   . Colitis   . COPD  (chronic obstructive pulmonary disease) (HCC)    2.5 L O2 AT NIGHT  . Crohn disease ([place])   . Diverticulosis   . Dyspnea    OFTEN  . GERD (gastroesophageal reflux disease)   . Hypercholesteremia   . Hypertension   . Migraines   . Motion sickness   . Orthopnea    sleeps in chair  . Restless leg syndrome   . Seasonal allergies      Past Surgical History: Past Surgical History:  Procedure Laterality Date  . CATARACT EXTRACTION W/PHACO Right 07/22/2016   Procedure: CATARACT EXTRACTION PHACO AND INTRAOCULAR LENS PLACEMENT ([place]) RIGHT;  Surgeon: Leandrew Koyanagi, MD;  Location: [place];  Service: Ophthalmology;  Laterality: Right;  IVA TOPICAL RIGHT TORIC  . CATARACT EXTRACTION W/PHACO Left 08/05/2016   Procedure: CATARACT EXTRACTION PHACO AND INTRAOCULAR LENS PLACEMENT ([place])  left toric lens;  Surgeon: Leandrew Koyanagi, MD;  Location: [place];  Service: Ophthalmology;  Laterality: Left;  Toric lens  . COLONOSCOPY WITH PROPOFOL N/A 02/28/2015   Procedure: COLONOSCOPY WITH PROPOFOL;  Surgeon: Manya Silvas, MD;  Location: [facility] ENDOSCOPY;  Service: Endoscopy;  Laterality: N/A;  . ESOPHAGOGASTRODUODENOSCOPY (EGD) WITH PROPOFOL N/A 02/28/2015   Procedure: ESOPHAGOGASTRODUODENOSCOPY (  EGD) WITH PROPOFOL;  Surgeon: Manya Silvas, MD;  Location: [facility] ENDOSCOPY;  Service: Endoscopy;  Laterality: N/A;  . IR FLUORO GUIDED NEEDLE PLC ASPIRATION/INJECTION LOC  09/01/2017  . JOINT REPLACEMENT  2002   hip  . PILONIDAL CYST EXCISION  1971  . REVISION TOTAL HIP ARTHROPLASTY  2003  . ROTATOR CUFF REPAIR  2016  . ruptured disc  1984     Family History: Family History  Problem Relation Age of Onset  . Lung disease Father   . Heart disease Father   . Lung disease Mother   . Cancer Brother        liver  . Cancer Maternal Aunt      Social History: Social History   Socioeconomic History  . Marital status: Married    Spouse name: Not on file  . Number of  children: Not on file  . Years of education: Not on file  . Highest education level: Not on file  Occupational History  . Not on file  Social Needs  . Financial resource strain: Not on file  . Food insecurity:    Worry: Not on file    Inability: Not on file  . Transportation needs:    Medical: Not on file    Non-medical: Not on file  Tobacco Use  . Smoking status: Former Smoker    Packs/day: 3.00    Years: 24.00    Pack years: 72.00    Types: Cigarettes    Last attempt to quit: 03/08/1988    Years since quitting: 30.1  . Smokeless tobacco: Never Used  Substance and Sexual Activity  . Alcohol use: Yes    Alcohol/week: 0.0 standard drinks    Comment: occasional  . Drug use: Not on file  . Sexual activity: Not on file  Lifestyle  . Physical activity:    Days per week: Not on file    Minutes per session: Not on file  . Stress: Not on file  Relationships  . Social connections:    Talks on phone: Not on file    Gets together: Not on file    Attends religious service: Not on file    Active member of club or organization: Not on file    Attends meetings of clubs or organizations: Not on file    Relationship status: Not on file  . Intimate partner violence:    Fear of current or ex partner: Not on file    Emotionally abused: Not on file    Physically abused: Not on file    Forced sexual activity: Not on file  Other Topics Concern  . Not on file  Social History Narrative  . Not on file     Review of Systems: Gen: Denies any fevers or chills  HEENT: wear hearing aids CV: No chest pain or shortness of breath Resp: No cough or sputum production GI: Appetite has been good GU : No problems with voiding MS: Chronic back pain, joint pains Derm:  No complaints Psych: No complaints Heme: No complaints Neuro: Under evaluation for neuropathy Endocrine.  No complaints takes medicines for hypothyroidism  Vital Signs: Blood pressure (!) 94/59, pulse (!) 58, temperature 97.8  F (36.6 C), temperature source Axillary, resp. rate 11, height 5' 7"  (1.702 m), weight 129.3 kg, SpO2 97 %.   Intake/Output Summary (Last 24 hours) at 04/20/2018 1129 Last data filed at 04/20/2018 1025 Gross per 24 hour  Intake 1396.7 ml  Output -  Net 1396.7 ml  Weight trends: Filed Weights   04/19/18 1714  Weight: 129.3 kg    Physical Exam: General:  Morbidly obese gentleman, sitting up in the bed  HEENT  moist oral mucous membranes  Neck:  Supple, no masses  Lungs:  Normal breathing effort, clear to auscultation on room air  Heart::  Regular, no rub  Abdomen:  Obese, soft, nontender  Extremities:  Trace to 1+ pitting edema bilaterally  Neurologic:  Alert, oriented, atrophy of small muscles of pain  Skin:  No acute rashes    Lab results: Basic Metabolic Panel: Recent Labs  Lab 04/19/18 1717 04/20/18 0453  NA 136 140  K 6.6* 4.6  CL 110 112*  CO2 15* 20*  GLUCOSE 127* 119*  BUN 46* 48*  CREATININE 1.14 1.39*  CALCIUM 9.4 8.8*    Liver Function Tests: Recent Labs  Lab 04/20/18 0453  AST 12*  ALT 8  ALKPHOS 52  BILITOT 0.6  PROT 6.5  ALBUMIN 3.3*   No results for input(s): LIPASE, AMYLASE in the last 168 hours. No results for input(s): AMMONIA in the last 168 hours.  CBC: Recent Labs  Lab 04/19/18 1717  WBC 9.0  HGB 12.4*  HCT 40.3  MCV 95.5  PLT 312    Cardiac Enzymes: Recent Labs  Lab 04/19/18 1717  TROPONINI <0.03    BNP: Invalid input(s): POCBNP  CBG: No results for input(s): GLUCAP in the last 168 hours.  Microbiology: No results found for this or any previous visit (from the past 720 hour(s)).   Coagulation Studies: No results for input(s): LABPROT, INR in the last 72 hours.  Urinalysis: No results for input(s): COLORURINE, LABSPEC, PHURINE, GLUCOSEU, HGBUR, BILIRUBINUR, KETONESUR, PROTEINUR, UROBILINOGEN, NITRITE, LEUKOCYTESUR in the last 72 hours.  Invalid input(s): APPERANCEUR      Imaging:  No results  found.   Assessment & Plan: Pt is a 63 y.o. Caucasian  male with COPD, hearing impairment, hypothyroidism, Crohn's disease, diverticulitis, hypertension, peripheral neuropathy, was admitted on 04/19/2018 with severe hyperkalemia.   1.  Severe hyperkalemia. We discussed sources of potassium in diet.  Advised to avoid using salt substitutes.   Soak potatoes prior to cooking Repeat potassium level is now normal  2.  Acute kidney injury Possibly secondary to aggressive IV diuresis to correct potassium Expected to improve with normal diet We discussed avoiding nonsteroidals  We will arrange follow-up in the office     LOS: [place] 2/19/202011:29 [place], [place]  Note: This note was prepared with Dragon dictation. Any transcription errors are unintentional

## 2018-04-20 NOTE — Progress Notes (Signed)
MD notified of pt low BP. Will give 500c NS bolus per order and recheck. Pt is asymptomatic

## 2018-04-20 NOTE — Discharge Summary (Signed)
Ernest Sims at [place] NAME: Ernest Sims    MR#:  [id number]  DATE OF BIRTH:  [birth year]  DATE OF ADMISSION:  04/19/2018   ADMITTING PHYSICIAN: Gorden Harms, MD  DATE OF DISCHARGE: 04/20/18  PRIMARY CARE PHYSICIAN: System, Pcp Not In   ADMISSION DIAGNOSIS:  Hyperkalemia [E87.5] DISCHARGE DIAGNOSIS:  Active Problems:   Hyperkalemia  SECONDARY DIAGNOSIS:   Past Medical History:  Diagnosis Date  . Anxiety   . Arthritis   . Colitis   . COPD (chronic obstructive pulmonary disease) (HCC)    2.5 L O2 AT NIGHT  . Crohn disease ([place])   . Diverticulosis   . Dyspnea    OFTEN  . GERD (gastroesophageal reflux disease)   . Hypercholesteremia   . Hypertension   . Migraines   . Motion sickness   . Orthopnea    sleeps in chair  . Restless leg syndrome   . Seasonal allergies    HOSPITAL COURSE:   [place] is a 63 year old male who was sent to the ED by his PCP for hyperkalemia. In the ED, K was 6.6. His kidney function was normal. He was not taking an ACE/ARB. He was given Lokelma, lasix, D5, and insulin in the ED. Potassium improved to 4.6. His creatinine did increase from 1.14 on admission to 1.39 on discharge. This may have been due to receiving lasix. His baseline does appear to be 1.1-1.3 on chart review. He was seen by nephrology, who felt that his hyperkalemia may be related to use salt substitutes at home. He will follow-up with nephrology as an outpatient in a week.  DISCHARGE CONDITIONS:  Hypertension CKD III GERD Chronic hypoxic respiratory failure due to COPD Chronic pain syndrome Hyperlipidemia Hypothyroidism CONSULTS OBTAINED:  Nephrology DRUG ALLERGIES:   Allergies  Allergen Reactions  . Etodolac Nausea And Vomiting  . Penicillins Other (See Comments)    Has patient had a PCN reaction causing immediate rash, facial/tongue/throat swelling, SOB or lightheadedness with hypotension: No Has patient had a PCN  reaction causing severe rash involving mucus membranes or skin necrosis: No Has patient had a PCN reaction that required hospitalization: No Has patient had a PCN reaction occurring within the last 10 years: No If all of the above answers are "NO", then may proceed with Cephalosporin use.   . Iodinated Diagnostic Agents Itching    Contrast dye  . Tape Rash    Paper tape caues rash   DISCHARGE MEDICATIONS:   Allergies as of 04/20/2018      Reactions   Etodolac Nausea And Vomiting   Penicillins Other (See Comments)   Has patient had a PCN reaction causing immediate rash, facial/tongue/throat swelling, SOB or lightheadedness with hypotension: No Has patient had a PCN reaction causing severe rash involving mucus membranes or skin necrosis: No Has patient had a PCN reaction that required hospitalization: No Has patient had a PCN reaction occurring within the last 10 years: No If all of the above answers are "NO", then may proceed with Cephalosporin use.   Iodinated Diagnostic Agents Itching   Contrast dye   Tape Rash   Paper tape caues rash      Medication List    STOP taking these medications   diazepam 10 MG tablet Commonly known as:  VALIUM   oxyCODONE 5 MG immediate release tablet Commonly known as:  Oxy IR/ROXICODONE   oxyCODONE-acetaminophen 5-325 MG tablet Commonly known as:  ROXICET   predniSONE 10  MG (21) Tbpk tablet Commonly known as:  STERAPRED UNI-PAK 21 TAB   predniSONE 10 MG tablet Commonly known as:  DELTASONE     TAKE these medications   albuterol 108 (90 Base) MCG/ACT inhaler Commonly known as:  PROVENTIL HFA;VENTOLIN HFA Inhale 2 puffs into the lungs every 6 (six) hours as needed for wheezing or shortness of breath.   aspirin 81 MG EC tablet Take 1 tablet (81 mg total) by mouth daily.   furosemide 40 MG tablet Commonly known as:  LASIX Take 40 mg by mouth daily.   gabapentin 300 MG capsule Commonly known as:  NEURONTIN Take 900 mg by mouth 3  (three) times daily.   levothyroxine 50 MCG tablet Commonly known as:  SYNTHROID, LEVOTHROID Take 50 mcg by mouth daily before breakfast.   Melatonin 10 MG Tabs Take 10 mg by mouth at bedtime as needed (sleep).   metaxalone 800 MG tablet Commonly known as:  SKELAXIN Take 800 mg by mouth at bedtime.   omeprazole 40 MG capsule Commonly known as:  PRILOSEC Take 40 mg by mouth 2 (two) times daily.   OXYGEN Inhale 2.5 L into the lungs as needed.   pravastatin 40 MG tablet Commonly known as:  PRAVACHOL Take 40 mg by mouth daily.   propranolol 40 MG tablet Commonly known as:  INDERAL Take 1 tablet (40 mg total) by mouth 2 (two) times daily.   rOPINIRole 0.25 MG tablet Commonly known as:  REQUIP Take 0.25 mg by mouth 2 (two) times daily.   timolol 0.5 % ophthalmic solution Commonly known as:  TIMOPTIC Place 1 drop into both eyes at bedtime.   topiramate 100 MG tablet Commonly known as:  TOPAMAX Take 1 tablet (100 mg total) by mouth 2 (two) times daily. What changed:    medication strength  how much to take   TRELEGY ELLIPTA 100-62.5-25 MCG/INH Aepb Generic drug:  Fluticasone-Umeclidin-Vilant Inhale 1 puff into the lungs daily.   XALATAN 0.005 % ophthalmic solution Generic drug:  latanoprost Place 1 drop into both eyes daily.   zolpidem 10 MG tablet Commonly known as:  AMBIEN Take 10 mg by mouth at bedtime.        DISCHARGE INSTRUCTIONS:  1. Follow-up with PCP in 5 days 2. F/u with nephrology in 1 week DIET:  Cardiac diet DISCHARGE CONDITION:  Stable ACTIVITY:  Activity as tolerated OXYGEN:  Home Oxygen: Yes.    Oxygen Delivery: 2.5 liters/min via Patient connected to nasal cannula oxygen DISCHARGE LOCATION:  home   If you experience worsening of your admission symptoms, develop shortness of breath, life threatening emergency, suicidal or homicidal thoughts you must seek medical attention immediately by calling 911 or calling your MD immediately  if  symptoms less severe.  You Must read complete instructions/literature along with all the possible adverse reactions/side effects for all the Medicines you take and that have been prescribed to you. Take any new Medicines after you have completely understood and accpet all the possible adverse reactions/side effects.   Please note  You were cared for by a hospitalist during your hospital stay. If you have any questions about your discharge medications or the care you received while you were in the hospital after you are discharged, you can call the unit and asked to speak with the hospitalist on call if the hospitalist that took care of you is not available. Once you are discharged, your primary care physician will handle any further medical issues. Please note that NO REFILLS  for any discharge medications will be authorized once you are discharged, as it is imperative that you return to your primary care physician (or establish a relationship with a primary care physician if you do not have one) for your aftercare needs so that they can reassess your need for medications and monitor your lab values.    On the day of Discharge:  VITAL SIGNS:  Blood pressure 103/70, pulse 64, temperature 98.3 F (36.8 C), temperature source Oral, resp. rate 20, height 5' 7"  (1.702 m), weight 129.3 kg, SpO2 100 %. PHYSICAL EXAMINATION:  GENERAL:  63 y.o.-year-old patient lying in the bed with no acute distress.  EYES: Pupils equal, round, reactive to light and accommodation. No scleral icterus. Extraocular muscles intact.  HEENT: Head atraumatic, normocephalic. Oropharynx and nasopharynx clear.  NECK:  Supple, no jugular venous distention. No thyroid enlargement, no tenderness.  LUNGS: Normal breath sounds bilaterally, no wheezing, rales,rhonchi or crepitation. No use of accessory muscles of respiration.  CARDIOVASCULAR: S1, S2 normal. No murmurs, rubs, or gallops.  ABDOMEN: Soft, non-tender, non-distended. Bowel  sounds present. No organomegaly or mass.  EXTREMITIES: No pedal edema, cyanosis, or clubbing.  NEUROLOGIC: Cranial nerves II through XII are intact. Muscle strength 5/5 in all extremities. Sensation intact. Gait not checked.  PSYCHIATRIC: The patient is alert and oriented x 3.  SKIN: No obvious rash, lesion, or ulcer.  DATA REVIEW:   CBC Recent Labs  Lab 04/19/18 1717  WBC 9.0  HGB 12.4*  HCT 40.3  PLT 312    Chemistries  Recent Labs  Lab 04/20/18 0453  NA 140  K 4.6  CL 112*  CO2 20*  GLUCOSE 119*  BUN 48*  CREATININE 1.39*  CALCIUM 8.8*  AST 12*  ALT 8  ALKPHOS 52  BILITOT 0.6     Microbiology Results  No results found for this or any previous visit.  RADIOLOGY:  US Renal  Result Date: 04/20/2018 CLINICAL DATA:  Acute renal failure and hyperkalemia EXAM: RENAL / URINARY TRACT ULTRASOUND COMPLETE COMPARISON:  None. FINDINGS: Right Kidney: Renal measurements: 9.8 x 5.6 x 5.2 cm = volume: 148.2 mL . Echogenicity within normal limits. No mass or hydronephrosis visualized. Moderate cortical thinning is present. Left Kidney: Renal measurements: 11.5 x 5.6 x 5.0 cm = volume: 167.7 mL. Echogenicity within normal limits. No mass or hydronephrosis visualized. Moderate cortical thinning is present. Bladder: Within normal limits.  The right ureteral jet was identified. IMPRESSION: Other than moderate bilateral cortical thinning, the study is within normal limits. Specifically, no evidence of hydronephrosis to suggest obstruction. Electronically Signed   By: Marybelle Killings M.D.   On: 04/20/2018 15:00     Management plans discussed with the patient, family and they are in agreement.  CODE STATUS: Full Code   TOTAL TIME TAKING CARE OF THIS PATIENT: 35 minutes.    Berna Spare Merisa Julio M.D on 04/20/2018 at 5:04 PM  Between 7am to 6pm - Pager - [phone]  After 6pm go to [email or web address] - Proofreader  [facility]  Office  [phone]  CC: Primary  care physician; System, Pcp Not In   Note: This dictation was prepared with Dragon dictation along with smaller phrase technology. Any transcriptional errors that result from this process are unintentional.

## 2018-04-20 NOTE — Discharge Instructions (Signed)
It was so nice to meet you during this hospitalization!  You came into the hospital with high potassium. This may have been related to the salt substitutes you were using.  Dr. Candiss Norse (the kidney doctor) will see you in clinic in 1 week. His office will call you to schedule an appointment.  Take care, Dr. Brett Albino

## 2018-04-21 LAB — HIV ANTIBODY (ROUTINE TESTING W REFLEX): HIV Screen 4th Generation wRfx: NONREACTIVE

## 2021-01-17 IMAGING — US US RENAL
1 series · 14 of 25 positions shown · non-contrast
Comparison: None.

CLINICAL DATA: Acute renal failure and hyperkalemia

EXAM:
RENAL / URINARY TRACT ULTRASOUND COMPLETE

[Series 1: us renal · 14 of 28 slices shown]
[im 1/28]
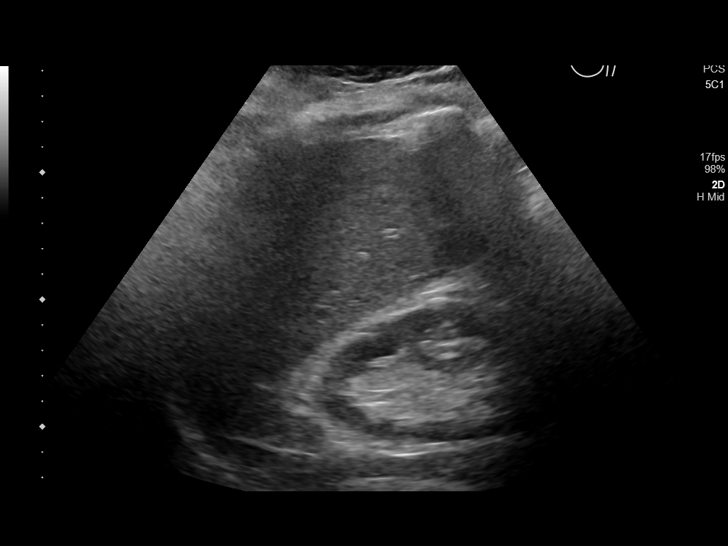
[im 3/28]
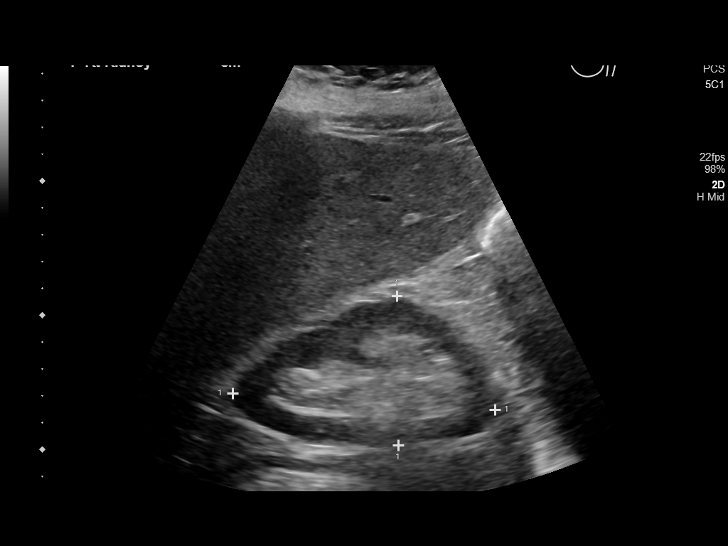
[im 5/28]
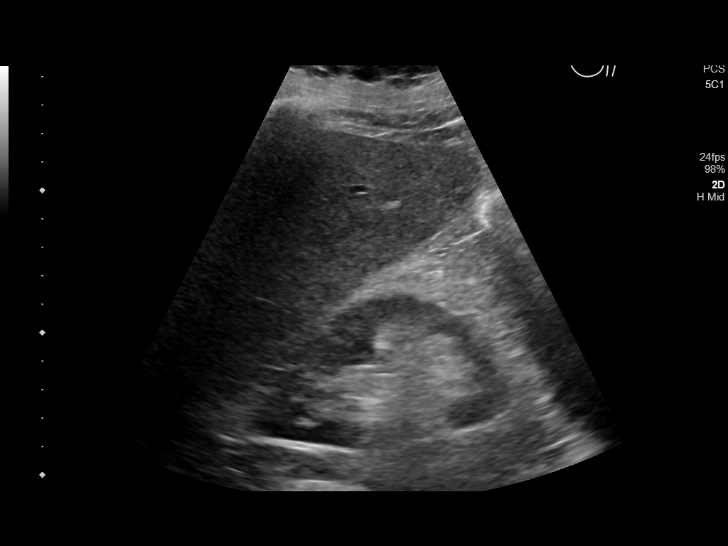
[im 7/28]
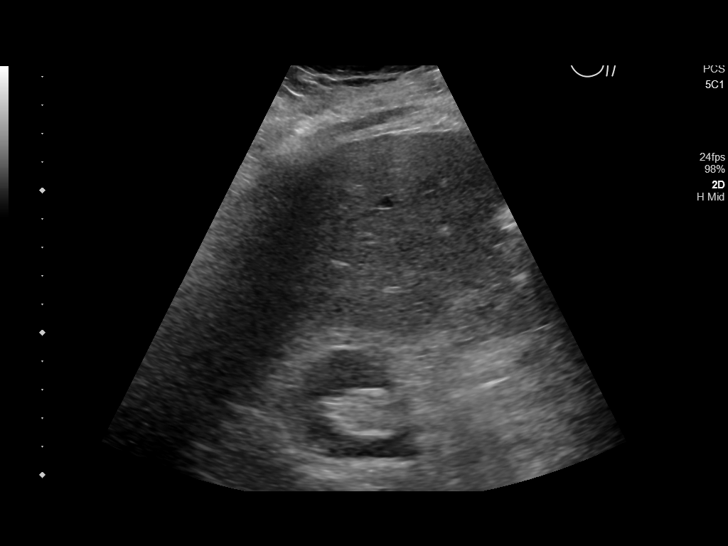
[im 10/28]
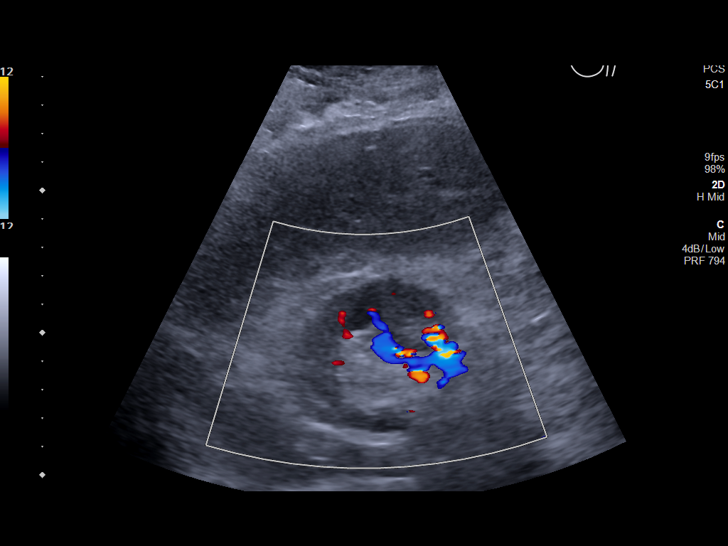
[im 11/28]
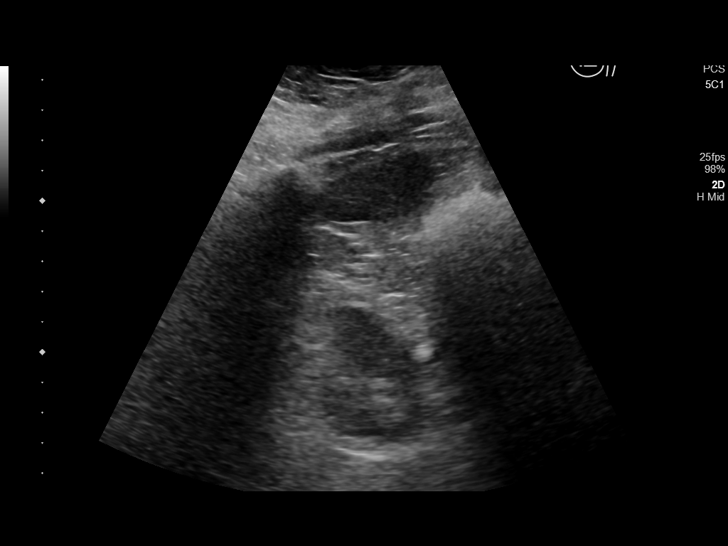
[im 13/28]
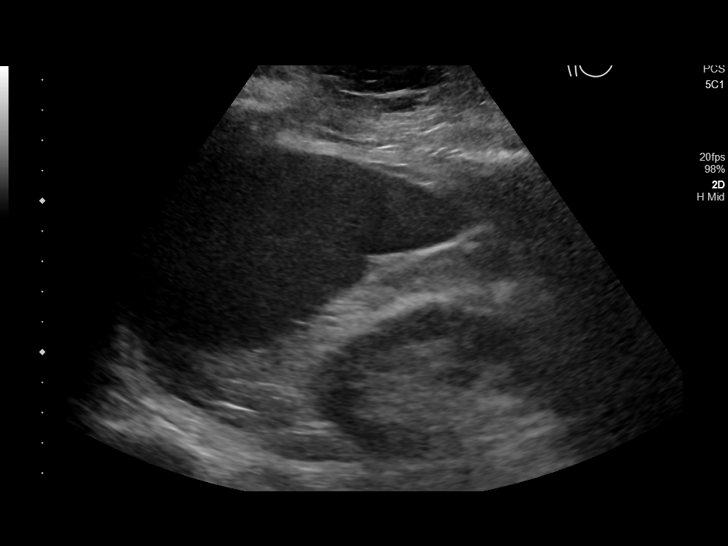
[im 15/28]
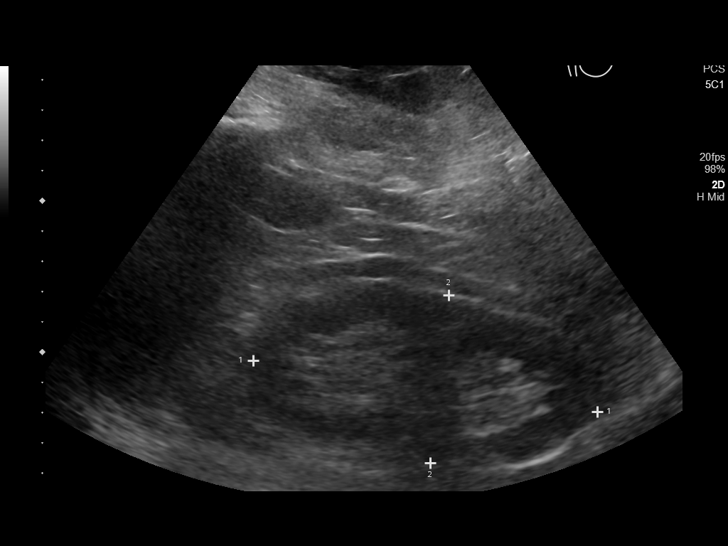
[im 17/28]
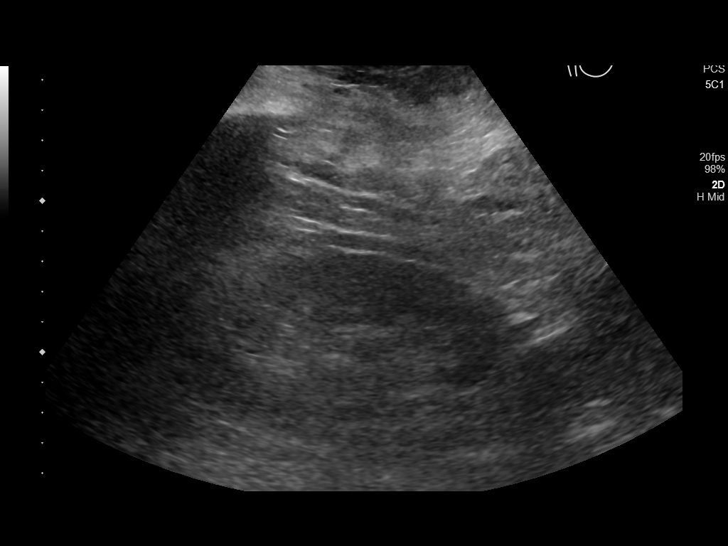
[im 19/28]
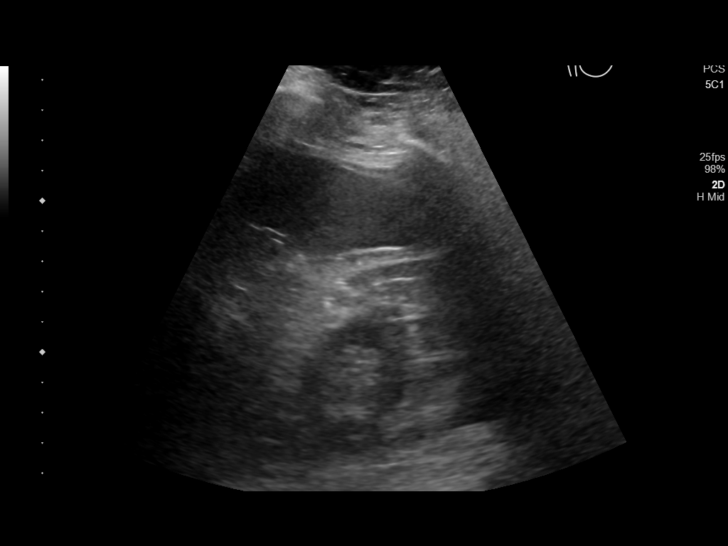
[im 21/28]
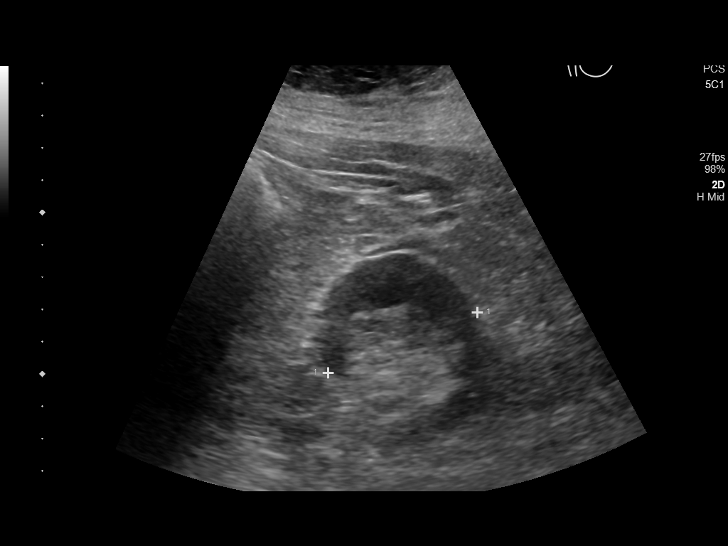
[im 23/28]
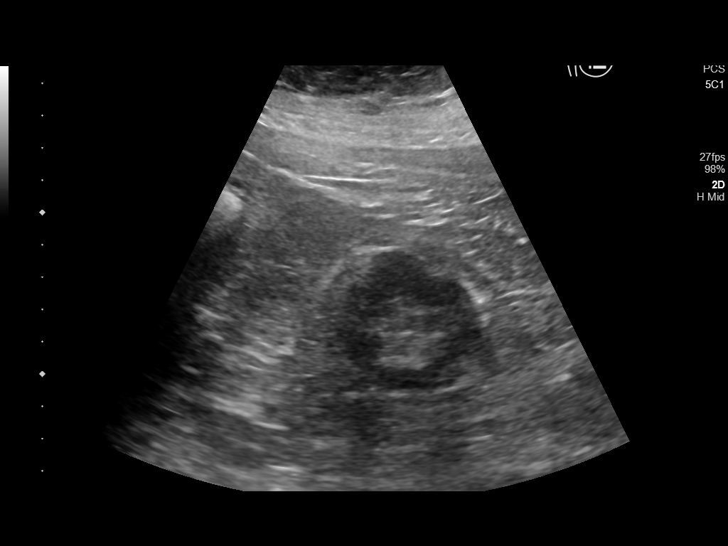
[im 25/28]
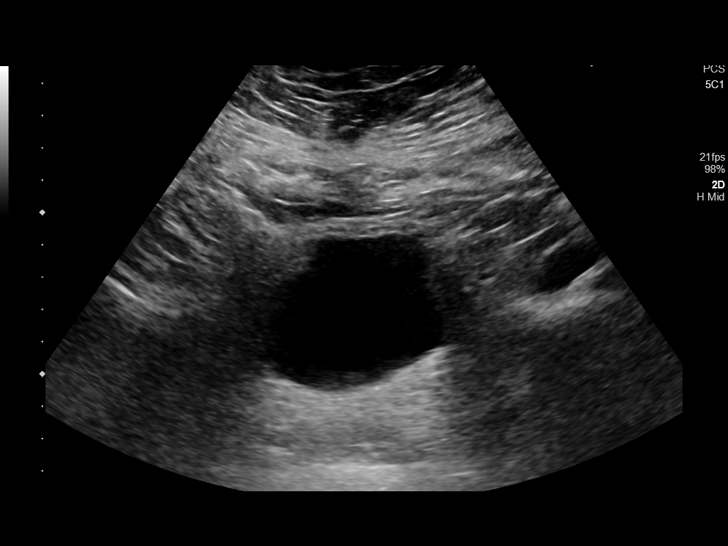
[im 28/28]
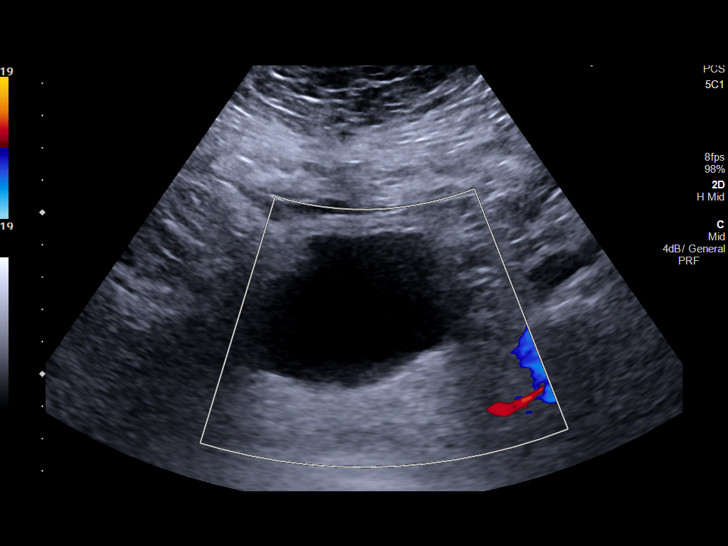

[14 of 25 positions shown; findings below may reference images not displayed]

FINDINGS: Right Kidney:

Renal measurements: 9.8 x 5.6 x 5.2 cm = volume: 148.2 mL .
Echogenicity within normal limits. No mass or hydronephrosis
visualized. Moderate cortical thinning is present.

Left Kidney:

Renal measurements: 11.5 x 5.6 x 5.0 cm = volume: 167.7 mL.
Echogenicity within normal limits. No mass or hydronephrosis
visualized. Moderate cortical thinning is present.

Bladder:

Within normal limits.  The right ureteral jet was identified.
IMPRESSION: Other than moderate bilateral cortical thinning, the study is within
normal limits. Specifically, no evidence of hydronephrosis to
suggest obstruction.

## 2022-08-26 ENCOUNTER — Encounter (HOSPITAL_COMMUNITY): Payer: Self-pay

## 2022-08-26 ENCOUNTER — Other Ambulatory Visit: Payer: Self-pay

## 2022-08-26 ENCOUNTER — Emergency Department (HOSPITAL_COMMUNITY): Payer: Medicare HMO

## 2022-08-26 ENCOUNTER — Emergency Department (HOSPITAL_COMMUNITY)
Admission: EM | Admit: 2022-08-26 | Discharge: 2022-08-26 | Disposition: A | Discharge status: Home / Self Care | Payer: Medicare HMO | Attending: Emergency Medicine | Admitting: Emergency Medicine

## 2022-08-26 DIAGNOSIS — Z7982 Long term (current) use of aspirin: Secondary | ICD-10-CM | POA: Insufficient documentation

## 2022-08-26 DIAGNOSIS — R519 Headache, unspecified: Secondary | ICD-10-CM | POA: Diagnosis not present

## 2022-08-26 DIAGNOSIS — S80212A Abrasion, left knee, initial encounter: Secondary | ICD-10-CM | POA: Diagnosis not present

## 2022-08-26 DIAGNOSIS — Z79899 Other long term (current) drug therapy: Secondary | ICD-10-CM | POA: Diagnosis not present

## 2022-08-26 DIAGNOSIS — W010XXA Fall on same level from slipping, tripping and stumbling without subsequent striking against object, initial encounter: Secondary | ICD-10-CM | POA: Insufficient documentation

## 2022-08-26 DIAGNOSIS — W19XXXA Unspecified fall, initial encounter: Secondary | ICD-10-CM

## 2022-08-26 DIAGNOSIS — S0031XA Abrasion of nose, initial encounter: Secondary | ICD-10-CM | POA: Diagnosis not present

## 2022-08-26 DIAGNOSIS — S0091XA Abrasion of unspecified part of head, initial encounter: Secondary | ICD-10-CM

## 2022-08-26 DIAGNOSIS — E119 Type 2 diabetes mellitus without complications: Secondary | ICD-10-CM | POA: Diagnosis not present

## 2022-08-26 DIAGNOSIS — S80812A Abrasion, left lower leg, initial encounter: Secondary | ICD-10-CM | POA: Insufficient documentation

## 2022-08-26 DIAGNOSIS — J449 Chronic obstructive pulmonary disease, unspecified: Secondary | ICD-10-CM | POA: Diagnosis not present

## 2022-08-26 DIAGNOSIS — S0001XA Abrasion of scalp, initial encounter: Secondary | ICD-10-CM | POA: Diagnosis not present

## 2022-08-26 DIAGNOSIS — S0990XA Unspecified injury of head, initial encounter: Secondary | ICD-10-CM | POA: Diagnosis present

## 2022-08-26 DIAGNOSIS — K5090 Crohn's disease, unspecified, without complications: Secondary | ICD-10-CM | POA: Insufficient documentation

## 2022-08-26 DIAGNOSIS — I10 Essential (primary) hypertension: Secondary | ICD-10-CM | POA: Insufficient documentation

## 2022-08-26 MED ORDER — MORPHINE SULFATE (PF) 4 MG/ML IV SOLN
4.0000 mg | Freq: Once | INTRAVENOUS | Status: AC
  Administered 2022-08-26: 4 mg via INTRAMUSCULAR
  Filled 2022-08-26: qty 1

## 2022-08-26 MED ORDER — FENTANYL CITRATE PF 50 MCG/ML IJ SOSY
50.0000 ug | PREFILLED_SYRINGE | Freq: Once | INTRAMUSCULAR | Status: AC
  Administered 2022-08-26: 50 ug via INTRAMUSCULAR
  Filled 2022-08-26: qty 1

## 2022-08-26 MED ORDER — BACITRACIN ZINC 500 UNIT/GM EX OINT: 1.0000 | TOPICAL_OINTMENT | Freq: Every day | CUTANEOUS | 0 refills | Status: AC

## 2022-08-26 MED ORDER — BACITRACIN ZINC 500 UNIT/GM EX OINT: 1.0000 | TOPICAL_OINTMENT | Freq: Two times a day (BID) | CUTANEOUS | 0 refills | Status: DC

## 2022-08-26 MED ORDER — HYDROCODONE-ACETAMINOPHEN 5-325 MG PO TABS
1.0000 | ORAL_TABLET | Freq: Once | ORAL | Status: AC
  Administered 2022-08-26: 1 via ORAL
  Filled 2022-08-26: qty 1

## 2022-08-26 NOTE — ED Triage Notes (Signed)
Pt to the ed from home via ems where he lives with family. Pt relays he was tanning in the back yard for about an hour and a half when he got up to go inside and fell down his 3 patio steps. Pt relays fell and hit his head, back and legs. Pt relays head and back pain with a skin aberration to left leg. Pt denies blood thinners, loc at this time. Pt denies dizziness prior to falling, cp, sob at this time.

## 2022-08-26 NOTE — ED Notes (Signed)
Pt has been transported to [facility].

## 2022-08-26 NOTE — ED Provider Notes (Signed)
[place] EMERGENCY DEPARTMENT AT [facility] Provider Note   CSN: [id number] Arrival date & time: 08/26/22  1416     History  Chief Complaint  Patient presents with   Ernest Sims is a 67 y.o. male with a history of diabetes mellitus, hypertension, COPD, and Crohn's disease who presents to the ED today after a fall.  Patient reports that he was walking upstairs when he tripped over a rug and fell forward, hitting his head. He denies loss of consciousness or blood thinner use.  Patient admits to headache, lower back pain, and bilateral knee pain.  He has abrasions to the top of his head, nose, and left knee and shin.  He denies any weakness, numbness, or tingling.  No other complaints or concerns at this time.    Home Medications Prior to Admission medications   Medication Sig Start Date End Date Taking? Authorizing Provider  albuterol (PROVENTIL HFA;VENTOLIN HFA) 108 (90 Base) MCG/ACT inhaler Inhale 2 puffs into the lungs every 6 (six) hours as needed for wheezing or shortness of breath. 04/06/17   Emily Filbert, MD  aspirin EC 81 MG EC tablet Take 1 tablet (81 mg total) by mouth daily. Patient not taking: Reported on 04/19/2018 04/29/15   Milagros Loll, MD  bacitracin ointment Apply 1 Application topically daily for 3 days. Apply thin layer to abrasions 08/26/22 08/29/22  Maxwell Marion, PA-C  Fluticasone-Umeclidin-Vilant (TRELEGY ELLIPTA) 100-62.5-25 MCG/INH AEPB Inhale 1 puff into the lungs daily.    [provider]  furosemide (LASIX) 40 MG tablet Take 40 mg by mouth daily.    [provider]  gabapentin (NEURONTIN) 300 MG capsule Take 900 mg by mouth 3 (three) times daily.    [provider]  latanoprost (XALATAN) 0.005 % ophthalmic solution Place 1 drop into both eyes daily.     [provider]  levothyroxine (SYNTHROID, LEVOTHROID) 50 MCG tablet Take 50 mcg by mouth daily before breakfast.  10/06/16 04/19/18  [provider]  Melatonin 10 MG TABS Take 10 mg by mouth at bedtime as needed (sleep).    [provider]  metaxalone (SKELAXIN) 800 MG tablet Take 800 mg by mouth at bedtime.     [provider]  omeprazole (PRILOSEC) 40 MG capsule Take 40 mg by mouth 2 (two) times daily.     [provider]  OXYGEN Inhale 2.5 L into the lungs as needed.    [provider]  pravastatin (PRAVACHOL) 40 MG tablet Take 40 mg by mouth daily.     [provider]  propranolol (INDERAL) 40 MG tablet Take 1 tablet (40 mg total) by mouth 2 (two) times daily. 04/20/18   Mayo, Allyn Kenner, MD  rOPINIRole (REQUIP) 0.25 MG tablet Take 0.25 mg by mouth 2 (two) times daily.    [provider]  timolol (TIMOPTIC) 0.5 % ophthalmic solution Place 1 drop into both eyes at bedtime.     [provider]  topiramate (TOPAMAX) 100 MG tablet Take 1 tablet (100 mg total) by mouth 2 (two) times daily. 04/20/18   Mayo, Allyn Kenner, MD  zolpidem (AMBIEN) 10 MG tablet Take 10 mg by mouth at bedtime.    [provider]      Allergies    Etodolac, Penicillins, Iodinated contrast media, and Tape    Review of Systems   Review of Systems  Musculoskeletal:  Positive for back pain.       Bilateral  knee pain  Neurological:  Positive for headaches.  All other systems reviewed and are negative.   Physical Exam Updated Vital Signs BP 134/83   Pulse 90   Temp 98.9 F (37.2 C)   Resp 15   Ht 5\' 7"  (1.702 m)   Wt 129 kg   SpO2 95%   BMI 44.54 kg/m  Physical Exam Vitals and nursing note reviewed.  Constitutional:      Appearance: Normal appearance.  HENT:     Head: Normocephalic.     Comments: Abrasions to the top of his head and nose    Nose: Nose normal.     Mouth/Throat:     Mouth: Mucous membranes are moist.  Eyes:     Conjunctiva/sclera: Conjunctivae normal.     Pupils: Pupils are equal, round, and reactive to light.  Cardiovascular:     Rate and Rhythm:  Normal rate and regular rhythm.     Pulses: Normal pulses.     Heart sounds: Normal heart sounds.  Pulmonary:     Effort: Pulmonary effort is normal.     Breath sounds: Normal breath sounds.  Abdominal:     Palpations: Abdomen is soft.     Tenderness: There is no abdominal tenderness.  Musculoskeletal:        General: Tenderness present.     Cervical back: Tenderness present.     Right lower leg: No edema.     Left lower leg: No edema.     Comments: Tenderness to the lumbar spinous processes without step-off or deformity appreciated. No paravertebral tenderness.  Tenderness also appreciated to the left knee on palpation.  Patient maintains full range of motion of his upper and lower extremities.  Movement of the left lower extremity elicited low back pain.  Skin:    General: Skin is warm and dry.     Capillary Refill: Capillary refill takes less than 2 seconds.     Findings: No rash.     Comments: Abrasion present on the left knee and shin  Neurological:     General: No focal deficit present.     Mental Status: He is alert.     Sensory: No sensory deficit.     Motor: No weakness.  Psychiatric:        Mood and Affect: Mood normal.        Behavior: Behavior normal.     ED Results / Procedures / Treatments   Labs (all labs ordered are listed, but only abnormal results are displayed) Labs Reviewed - No data to display  EKG None  Radiology CT Lumbar Spine Wo Contrast  Result Date: 08/26/2022 CLINICAL DATA:  possible compression facture EXAM: CT LUMBAR SPINE WITHOUT CONTRAST TECHNIQUE: Multidetector CT imaging of the lumbar spine was performed without intravenous contrast administration. Multiplanar CT image reconstructions were also generated. RADIATION DOSE REDUCTION: This exam was performed according to the departmental dose-optimization program which includes automated exposure control, adjustment of the mA and/or kV according to patient size and/or use of iterative  reconstruction technique. COMPARISON:  Same day lumbar spine radiograph, MRI lumbar spine 02/05/2019 FINDINGS: Segmentation: 5 lumbar type vertebrae. Alignment: Trace retrolisthesis of L1 on L2 and L2 on L3. Vertebrae: No acute fracture or focal pathologic process. There is a chronic right-sided pars defect at L4 Paraspinal and other soft tissues: Diverticulosis without evidence of diverticulitis. Aortic atherosclerosis. Disc levels: Moderate to severe spinal canal narrowing at L4-L5. moderate to severe bilateral neural foraminal stenosis at L4-L5 and L5-S1. There  is also moderate to severe left-sided neural foraminal stenosis at L3-L4 and L2-L3 IMPRESSION: 1.  No acute fracture or traumatic listhesis. 2. Multilevel degenerative changes, most notable at L3-L4 where there is moderate to severe spinal canal narrowing. Aortic Atherosclerosis (ICD10-I70.0). Electronically Signed   By: Lorenza Cambridge M.D.   On: 08/26/2022 17:24   CT Cervical Spine Wo Contrast  Result Date: 08/26/2022 CLINICAL DATA:  Larey Seat, pain EXAM: CT CERVICAL SPINE WITHOUT CONTRAST TECHNIQUE: Multidetector CT imaging of the cervical spine was performed without intravenous contrast. Multiplanar CT image reconstructions were also generated. RADIATION DOSE REDUCTION: This exam was performed according to the departmental dose-optimization program which includes automated exposure control, adjustment of the mA and/or kV according to patient size and/or use of iterative reconstruction technique. COMPARISON:  None Available. FINDINGS: Alignment: Mild right convex curvature of the cervical spine. Otherwise alignment is anatomic. Skull base and vertebrae: No acute fracture. No primary bone lesion or focal pathologic process. Soft tissues and spinal canal: No prevertebral fluid or swelling. No visible canal hematoma. Mild subcutaneous edema within the left posterolateral neck at the skull base may reflect contusion. Disc levels: Bony fusion across the C4-5  disc space. There is extensive spondylosis from C3-4 through C7-T1, with neural foraminal encroachment greatest at the C5-6 level. Upper chest: Airway is patent. Emphysematous changes at the lung apices. Other: Reconstructed images demonstrate no additional findings. IMPRESSION: 1. No acute cervical spine fracture. 2. Subcutaneous edema left posterolateral neck at the skull base consistent with contusion. 3. Diffuse cervical degenerative changes as above. Electronically Signed   By: Sharlet Salina M.D.   On: 08/26/2022 17:15   CT Head Wo Contrast  Result Date: 08/26/2022 CLINICAL DATA:  Larey Seat, head trauma EXAM: CT HEAD WITHOUT CONTRAST TECHNIQUE: Contiguous axial images were obtained from the base of the skull through the vertex without intravenous contrast. RADIATION DOSE REDUCTION: This exam was performed according to the departmental dose-optimization program which includes automated exposure control, adjustment of the mA and/or kV according to patient size and/or use of iterative reconstruction technique. COMPARISON:  04/26/2015 FINDINGS: Brain: No acute infarct or hemorrhage. Lateral ventricles and midline structures are unremarkable. No acute extra-axial fluid collections. No mass effect. Vascular: No hyperdense vessel or unexpected calcification. Skull: Normal. Negative for fracture or focal lesion. Sinuses/Orbits: There is diffuse mucoperiosteal thickening throughout the paranasal sinuses, with superimposed gas fluid levels within the maxillary sinuses bilaterally. Other: None. IMPRESSION: 1. No acute intracranial process. 2. 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 Electronically Signed   By: Sharlet Salina M.D.   On: 08/26/2022 17:11   DG Knee 2 Views Left  Result Date: 08/26/2022 CLINICAL DATA:  Pain after fall. EXAM: LEFT KNEE - 1-2 VIEW COMPARISON:  Left knee radiographs 04/26/2015 FINDINGS: Moderate lateral and mild medial compartment joint space narrowing and peripheral osteophytes. Moderate  patellofemoral joint space narrowing and peripheral osteophytosis. Small joint effusion. No acute fracture or dislocation. IMPRESSION: Moderate lateral and patellofemoral compartment and mild medial compartment osteoarthritis. Electronically Signed   By: Neita Garnet M.D.   On: 08/26/2022 16:13   DG Lumbar Spine 2-3 Views  Result Date: 08/26/2022 CLINICAL DATA:  pain post-fall EXAM: LUMBAR SPINE - 2 VIEW COMPARISON:  None Available. FINDINGS: Possible superior endplate compression deformities at T12, L1, and L2. Assessment of the sacrum is limited due to overlying bowel gas. Right hip arthroplasty. Multilevel degenerative this disease, most notably in the lower lumbar spine. IMPRESSION: Possible superior endplate compression deformities at T12, L1, and L2. Recommend  correlation with point tenderness and consider further evaluation with a CT of the lumbar spine. Electronically Signed   By: Lorenza Cambridge M.D.   On: 08/26/2022 16:12   DG Knee 2 Views Right  Result Date: 08/26/2022 CLINICAL DATA:  Pain after fall. EXAM: RIGHT KNEE - 1-2 VIEW COMPARISON:  None Available. FINDINGS: There is diffuse decreased bone mineralization. Moderate lateral and mild-to-moderate medial compartment joint space narrowing with moderate peripheral osteophytes. Moderate patellofemoral joint space narrowing and peripheral osteophytosis. Small joint effusion. No acute fracture or dislocation. IMPRESSION: Moderate tricompartmental osteoarthritis. No acute fracture. Electronically Signed   By: Neita Garnet M.D.   On: 08/26/2022 16:12    Procedures Procedures: not indicated.   Medications Ordered in ED Medications  fentaNYL (SUBLIMAZE) injection 50 mcg (has no administration in time range)  HYDROcodone-acetaminophen (NORCO/VICODIN) 5-325 MG per tablet 1 tablet (1 tablet Oral Given 08/26/22 1656)  morphine (PF) 4 MG/ML injection 4 mg (4 mg Intramuscular Given 08/26/22 1740)    ED Course/ Medical Decision Making/ A&P                              Medical Decision Making Amount and/or Complexity of Data Reviewed Radiology: ordered.  Risk Prescription drug management.   This patient presents to the ED for concern of headache, back pain, and bilateral knee pain post fall, this involves an extensive number of treatment options, and is a complaint that carries with it a high risk of complications and morbidity.   Differential diagnosis includes: ICH vs concussion vs atypical migraine vs contusion versus fracture of lumbar spine vs slipped lumbar disc vs muscular strain vs etc.   Co morbidities that complicate the patient evaluation  Degenerative disc disease Osteoarthritis Diabetes mellitus Hypertension COPD   Additional history obtained:  Additional history obtained from records.   Imaging Studies ordered:  I ordered imaging studies including Left and right knee x-rays showed: arthritis present on imaging without any acute fractures or dislocations. CT head imaging showed: no acute intracranial findings CT cervical spine showed: 1) no acute cervical spine fracture. 2) subcutaneous edema left posterolateral neck at the skull base consistent with contusion. 3) diffuse cervical degenerative changes as above. CT lumbar spine showed: I agree with the radiologist interpretation   Problem List / ED Course / Critical interventions / Medication management  Headache, lumbar spine pain, bilateral knee pain post fall.  Abrasions on head and left knee. I ordered medications including: Norco for pain. Patient still reported pain after Norco, IM morphine was given and patient still reported 8 out of 10 pain.  He goes to pain management is on oxycodone regularly he says that a lot of pain medications do not work that well because of that. 50 mcg of Fentanyl given prior to discharge. Abrasions were cleaned and dressed. Nurse ambulated patient and ensured he could walk before he left the ED. I have reviewed the  patients home medicines and have made adjustments as needed   Social Determinants of Health:  Housing Physical activity   Test / Admission - Considered:  Discussed results of imaging with patient and his wife at bedside. Patient was able to ambulate at his own with the nurse present. Wound cleaned and dressing applied. Patient is stable and safe for discharge home        Final Clinical Impression(s) / ED Diagnoses Final diagnoses:  Fall, initial encounter  Abrasion of head, initial encounter  Rx / DC Orders ED Discharge Orders          Ordered    bacitracin ointment  2 times daily,   Status:  Discontinued        08/26/22 1844    bacitracin ointment  Daily        08/26/22 1845              Maxwell Marion, PA-C 08/26/22 1852    Glendora Score, MD 08/27/22 1321

## 2022-08-26 NOTE — ED Notes (Signed)
Attempted to ambulate pt, was unable to ambulate pt due to the pt needing a assist device (a cane). Attempted to use a walker in its place, but was unable to locate any walkers, so walked pt with assistance. Pt was steady, and stated they felt a better than when they came in.

## 2022-08-26 NOTE — Discharge Instructions (Addendum)
As discussed, all of your imaging came back negative.  No head bleeds, fractures, or dislocations noted.  You may be sore for couple days, you can take Ibuprofen every 4 hours as needed for swelling and pain.  Follow-up with your primary care provider in 1 to 2 days for reevaluation.  Return to ED if: Your pain persists or worsens, you develop loss of consciousness, dizziness, or weakness.

## 2024-03-29 ENCOUNTER — Encounter: Payer: Self-pay | Admitting: *Deleted

## 2024-03-29 ENCOUNTER — Emergency Department: Admission: EM | Admit: 2024-03-29 | Discharge: 2024-03-29 | Disposition: A | Discharge status: Home / Self Care

## 2024-03-29 ENCOUNTER — Emergency Department

## 2024-03-29 ENCOUNTER — Other Ambulatory Visit: Payer: Self-pay

## 2024-03-29 DIAGNOSIS — I619 Nontraumatic intracerebral hemorrhage, unspecified: Secondary | ICD-10-CM | POA: Insufficient documentation

## 2024-03-29 DIAGNOSIS — D649 Anemia, unspecified: Secondary | ICD-10-CM | POA: Diagnosis not present

## 2024-03-29 DIAGNOSIS — W1839XA Other fall on same level, initial encounter: Secondary | ICD-10-CM | POA: Diagnosis not present

## 2024-03-29 DIAGNOSIS — W19XXXA Unspecified fall, initial encounter: Secondary | ICD-10-CM

## 2024-03-29 DIAGNOSIS — J449 Chronic obstructive pulmonary disease, unspecified: Secondary | ICD-10-CM | POA: Diagnosis not present

## 2024-03-29 DIAGNOSIS — S066X0A Traumatic subarachnoid hemorrhage without loss of consciousness, initial encounter: Secondary | ICD-10-CM | POA: Insufficient documentation

## 2024-03-29 DIAGNOSIS — Y92009 Unspecified place in unspecified non-institutional (private) residence as the place of occurrence of the external cause: Secondary | ICD-10-CM | POA: Diagnosis not present

## 2024-03-29 DIAGNOSIS — S06360A Traumatic hemorrhage of cerebrum, unspecified, without loss of consciousness, initial encounter: Secondary | ICD-10-CM | POA: Insufficient documentation

## 2024-03-29 LAB — CBC WITH DIFFERENTIAL/PLATELET
Abs Immature Granulocytes: 0.02 10*3/uL (ref 0.00–0.07)
Basophils Absolute: 0 10*3/uL (ref 0.0–0.1)
Basophils Relative: 1 %
Eosinophils Absolute: 0.3 10*3/uL (ref 0.0–0.5)
Eosinophils Relative: 5 %
HCT: 37 % — ABNORMAL LOW (ref 39.0–52.0)
Hemoglobin: 11.7 g/dL — ABNORMAL LOW (ref 13.0–17.0)
Immature Granulocytes: 0 %
Lymphocytes Relative: 15 %
Lymphs Abs: 0.8 10*3/uL (ref 0.7–4.0)
MCH: 30.3 pg (ref 26.0–34.0)
MCHC: 31.6 g/dL (ref 30.0–36.0)
MCV: 95.9 fL (ref 80.0–100.0)
Monocytes Absolute: 0.7 10*3/uL (ref 0.1–1.0)
Monocytes Relative: 13 %
Neutro Abs: 3.5 10*3/uL (ref 1.7–7.7)
Neutrophils Relative %: 66 %
Platelets: 168 10*3/uL (ref 150–400)
RBC: 3.86 MIL/uL — ABNORMAL LOW (ref 4.22–5.81)
RDW: 14.9 % (ref 11.5–15.5)
WBC: 5.3 10*3/uL (ref 4.0–10.5)
nRBC: 0 % (ref 0.0–0.2)

## 2024-03-29 LAB — COMPREHENSIVE METABOLIC PANEL WITH GFR
ALT: 14 U/L (ref 0–44)
AST: 24 U/L (ref 15–41)
Albumin: 3.9 g/dL (ref 3.5–5.0)
Alkaline Phosphatase: 114 U/L (ref 38–126)
Anion gap: 10 (ref 5–15)
BUN: 25 mg/dL — ABNORMAL HIGH (ref 8–23)
CO2: 25 mmol/L (ref 22–32)
Calcium: 8.8 mg/dL — ABNORMAL LOW (ref 8.9–10.3)
Chloride: 106 mmol/L (ref 98–111)
Creatinine, Ser: 1.15 mg/dL (ref 0.61–1.24)
GFR, Estimated: >60 mL/min
Glucose, Bld: 99 mg/dL (ref 70–99)
Potassium: 3.8 mmol/L (ref 3.5–5.1)
Sodium: 141 mmol/L (ref 135–145)
Total Bilirubin: 0.3 mg/dL (ref 0.0–1.2)
Total Protein: 6.4 g/dL — ABNORMAL LOW (ref 6.5–8.1)

## 2024-03-29 LAB — PROTIME-INR
INR: 1 (ref 0.8–1.2)
Prothrombin Time: 13.7 s (ref 11.4–15.2)

## 2024-03-29 MED ORDER — ONDANSETRON HCL 4 MG/2ML IJ SOLN
4.0000 mg | Freq: Once | INTRAMUSCULAR | Status: AC
  Administered 2024-03-29: 4 mg via INTRAVENOUS
  Filled 2024-03-29: qty 2

## 2024-03-29 MED ORDER — ACETAMINOPHEN 500 MG PO TABS
1000.0000 mg | ORAL_TABLET | Freq: Once | ORAL | Status: AC
  Administered 2024-03-29: 1000 mg via ORAL
  Filled 2024-03-29: qty 2

## 2024-03-29 MED ORDER — OXYCODONE HCL 5 MG PO TABS
5.0000 mg | ORAL_TABLET | Freq: Once | ORAL | Status: AC
  Administered 2024-03-29: 5 mg via ORAL
  Filled 2024-03-29: qty 1

## 2024-03-29 MED ORDER — FENTANYL CITRATE (PF) 50 MCG/ML IJ SOSY
25.0000 ug | PREFILLED_SYRINGE | Freq: Once | INTRAMUSCULAR | Status: AC
  Administered 2024-03-29: 25 ug via INTRAVENOUS
  Filled 2024-03-29: qty 1

## 2024-03-29 MED ORDER — LEVETIRACETAM 500 MG PO TABS
500.0000 mg | ORAL_TABLET | Freq: Once | ORAL | Status: AC
  Administered 2024-03-29: 500 mg via ORAL
  Filled 2024-03-29: qty 1

## 2024-03-29 NOTE — ED Triage Notes (Addendum)
 Pt to triage via wheelchair. Pt had ct scan today at [facility] and states he has a head bleed.  Pt has bruising to face.  Pt fell 5 days ago.  No blood thinners.   Pt fell inside the house over a rug.  Pt alert.  Pt to room  12 from triage

## 2024-03-29 NOTE — ED Provider Notes (Signed)
 "  [facility] Provider Note    Event Date/Time   First MD Initiated Contact with Patient 03/29/24 1934     (approximate)   History   Headache  Pt to triage via wheelchair. Pt had ct scan today at [facility] and states he has a head bleed.  Pt has bruising to face.  Pt fell 5 days ago.  No blood thinners.   Pt fell inside the house over a rug.  Pt alert.  Pt to room  12 from triage    HPI Ernest Sims is a 69 y.o. male PMH COPD, Crohn's disease, migraines, acid reflux, chronic venous insufficiency presents for evaluation of reported abnormal CT head - Friday night, fell at home, walker got stuck on something and fell - no loc - no thinners - no vomiting, ams - no recent infectious sx - went to ortho clinic for knee evaluation (in process of replacement), they got a CT orbits, found intracranial hemorrhage, told to come to ED for eval - Patient does complain of worsening headache today.  Otherwise has been in his usual state of health.  Ambulatory.  No subsequent falls.  Per chart review, patient was seen  [facility] 03/29/24 at 6:37pm: IMPRESSION: 1. Acute appearing intraparenchymal hemorrhage in the inferior right frontal lobe. Recommend a full head CT on emergent basis for further evaluation. Findings were discussed with Dr. Mark Karavan by Dr. Donetta at 6:30pm on 03/29/2024. 2. No acute facial bone fractures.       Physical Exam   Triage Vital Signs: ED Triage Vitals  Encounter Vitals Group     BP 03/29/24 1926 (!) 142/55     Girls Systolic BP Percentile --      Girls Diastolic BP Percentile --      Boys Systolic BP Percentile --      Boys Diastolic BP Percentile --      Pulse Rate 03/29/24 1926 74     Resp 03/29/24 1926 18     Temp 03/29/24 1926 98 F (36.7 C)     Temp Source 03/29/24 1926 Oral     SpO2 03/29/24 1926 95 %     Weight 03/29/24 1923 233 lb (105.7 kg)     Height 03/29/24 1923 5' 8 (1.727 m)     Head Circumference --      Peak  Flow --      Pain Score 03/29/24 1923 10     Pain Loc --      Pain Education --      Exclude from Growth Chart --     Most recent vital signs: Vitals:   03/29/24 2104 03/29/24 2200  BP:  (!) 153/70  Pulse: 72 88  Resp: 19 16  Temp:    SpO2: 100% 99%     General: Awake, no distress.  HEENT: Notable bruising to forehead and periorbital bruising.  Full range of motion of neck, no midline neck pain. CV:  Good peripheral perfusion. RRR, RP 2+ Resp:  Normal effort. CTAB Abd:  No distention. Nontender to deep palpation throughout Neuro: Aox4, CN II-XII intact, FNF wnl, finger taps fast b/l, 5/5 strength in bilateral finger extension/grip, arm flexion/extension, EHL/FHL. BUE AG 10+ sec no drift, BLE AG 5+ sec no drift. Ambulates with walker.  SI LT.    ED Results / Procedures / Treatments   Labs (all labs ordered are listed, but only abnormal results are displayed) Labs Reviewed  CBC WITH DIFFERENTIAL/PLATELET - Abnormal; Notable for  the following components:      Result Value   RBC 3.86 (*)    Hemoglobin 11.7 (*)    HCT 37.0 (*)    All other components within normal limits  COMPREHENSIVE METABOLIC PANEL WITH GFR - Abnormal; Notable for the following components:   BUN 25 (*)    Calcium 8.8 (*)    Total Protein 6.4 (*)    All other components within normal limits  PROTIME-INR     EKG  N/a   RADIOLOGY Radiology interpreted by myself and radiology report reviewed.  Notable for interval coronal hemorrhage and subarachnoid hemorrhage.    PROCEDURES:  Critical Care performed: Yes, see critical care procedure note(s)  .Critical Care  Performed by: Clarine Ozell LABOR, MD Authorized by: Clarine Ozell LABOR, MD   Critical care provider statement:    Critical care time (minutes):  30   Critical care was necessary to treat or prevent imminent or life-threatening deterioration of the following conditions:  CNS failure or compromise   Critical care was time spent personally by  me on the following activities:  Development of treatment plan with patient or surrogate, discussions with consultants, evaluation of patient's response to treatment, examination of patient, ordering and review of laboratory studies, ordering and review of radiographic studies, ordering and performing treatments and interventions, pulse oximetry, re-evaluation of patient's condition and review of old charts   I assumed direction of critical care for this patient from another provider in my specialty: no     Care discussed with: admitting provider      MEDICATIONS ORDERED IN ED: Medications  levETIRAcetam  (KEPPRA ) tablet 500 mg (has no administration in time range)  oxyCODONE  (Oxy IR/ROXICODONE ) immediate release tablet 5 mg (has no administration in time range)  acetaminophen  (TYLENOL ) tablet 1,000 mg (1,000 mg Oral Given 03/29/24 2049)  fentaNYL  (SUBLIMAZE ) injection 25 mcg (25 mcg Intravenous Given 03/29/24 2046)  ondansetron  (ZOFRAN ) injection 4 mg (4 mg Intravenous Given 03/29/24 2045)     IMPRESSION / MDM / ASSESSMENT AND PLAN / ED COURSE  I reviewed the triage vital signs and the nursing notes.                              DDX/MDM/AP: Differential diagnosis includes, but is not limited to, intracranial hemorrhage, consider skull fracture, C-spine fracture.  Will get basic screening labs and plan for CT head and C-spine with tentative plan for discussion with neurosurgery.  Clinically stable at this time.  Plan: - CT head, C-spine - Pain control - N.p.o. - Basic labs  Patient's presentation is most consistent with acute presentation with potential threat to life or bodily function.  The patient is on the cardiac monitor to evaluate for evidence of arrhythmia and/or significant heart rate changes.  ED course below.  CT with intraparenchymal hemorrhage and some subarachnoid hemorrhage.  Patient remains at baseline mental state per his report and family at bedside.  Nonfocal exam  here.  Reviewed case with neurosurgery who states no indication for repeat, can offer 2 days of Keppra  given event with 5 days ago, can follow-up in the outpatient setting.  Patient minimal plan.  Already has narcotics that he uses at home and can use Tylenol  in addition.  Counseled to avoid NSAIDs, confirms he is no longer taking aspirin .  ED return precautions discussed with family.  Clinical Course as of 03/29/24 2215  Wed Mar 29, 2024  2022 [facility]: IMPRESSION: 1.  Intraparenchymal hemorrhage in the right inferior frontal lobe measuring 1.4 x 1.0 x 1.2 cm (approximately 1 mL) with surrounding vasogenic edema and adjacent subarachnoid hemorrhage. 2. Left supraorbital hematoma without underlying fracture or radiopaque foreign body.   [MM]  2022 CT Cspine: IMPRESSION: 1. No evidence of acute traumatic injury. 2. Severe bilateral foraminal narrowing at C5-6 due to uncovertebral spurring. 3. Centrilobular emphysema at the lung apices. Pulmonary emphysema is an independent risk factor for lung cancer. Recommend consideration for evaluation for a low-dose CT lung cancer screening program.   [MM]  2026 Paging [facility] [MM]  2027 D/w Dr. Clois of [facility] - no in patient for repeat scan given timeframe - edema expected IPH - He will arrange outpatient follow-up for repeat scan in the future - can give keppra  for 2 days bid if patient amenable  [MM]  2133 CBC with mild stable anemia [MM]  2141 CMP reviewed, overall unremarkable [MM]    Clinical Course User Index [MM] Clarine Ozell LABOR, MD     FINAL CLINICAL IMPRESSION(S) / ED DIAGNOSES   Final diagnoses:  Intraparenchymal hemorrhage of brain (HCC)  Fall, initial encounter     Rx / DC Orders   ED Discharge Orders     None        Note:  This document was prepared using Dragon voice recognition software and may include unintentional dictation errors.   Clarine Ozell LABOR, MD 03/29/24 2215  "

## 2024-03-29 NOTE — Discharge Instructions (Addendum)
 Your evaluation in the emergency department was notable for bleeding in your brain.  I reviewed your case with our neurosurgeon (Dr. Dolan) and they will follow-up with you in clinic.  Have also provided their contact information above.  As discussed, we have started you on a short course of an antiseizure medication.  You can use Tylenol  and hydrocodone  as discussed, though I would avoid aspirin , Aleve, ibuprofen/Motrin, Advil and similar medications as these could contribute to worsened bleeding.  Please do follow-up with your primary care provider in addition, and return to the emergency department with any new or worsening symptoms including altered mental status, vomiting, uncontrollable pain, new weakness, or any other symptoms concerning to you.

## 2024-03-30 ENCOUNTER — Telehealth: Payer: Self-pay

## 2024-03-30 DIAGNOSIS — I619 Nontraumatic intracerebral hemorrhage, unspecified: Secondary | ICD-10-CM

## 2024-03-30 DIAGNOSIS — I609 Nontraumatic subarachnoid hemorrhage, unspecified: Secondary | ICD-10-CM

## 2024-03-30 NOTE — Telephone Encounter (Signed)
 The ER contacted Dr Clois about this patient. Per secure chat from Dr Clois on 03/30/24: 2-3 week head CT and telephone call with me - ok to book during lunch or end of day or double book  CT has been ordered, to be done around 04/13/24-04/20/24.  I notified the patient of the telephone appointment with Dr Clois and the need for the repeat head CT that needs to be done around 04/13/24, but prior to his appointment with Dr Clois. He is aware that radiology will contact him to schedule the CT scan.

## 2024-04-05 ENCOUNTER — Emergency Department

## 2024-04-05 ENCOUNTER — Other Ambulatory Visit: Payer: Self-pay

## 2024-04-05 ENCOUNTER — Emergency Department
Admission: EM | Admit: 2024-04-05 | Discharge: 2024-04-05 | Disposition: A | Discharge status: Home / Self Care | Attending: Emergency Medicine | Admitting: Emergency Medicine

## 2024-04-05 DIAGNOSIS — B029 Zoster without complications: Secondary | ICD-10-CM | POA: Insufficient documentation

## 2024-04-05 LAB — BASIC METABOLIC PANEL WITH GFR
Anion gap: 12 (ref 5–15)
BUN: 17 mg/dL (ref 8–23)
CO2: 23 mmol/L (ref 22–32)
Calcium: 8.9 mg/dL (ref 8.9–10.3)
Chloride: 103 mmol/L (ref 98–111)
Creatinine, Ser: 0.87 mg/dL (ref 0.61–1.24)
GFR, Estimated: >60 mL/min
Glucose, Bld: 90 mg/dL (ref 70–99)
Potassium: 4.3 mmol/L (ref 3.5–5.1)
Sodium: 138 mmol/L (ref 135–145)

## 2024-04-05 LAB — CBC
HCT: 41.4 % (ref 39.0–52.0)
Hemoglobin: 13.2 g/dL (ref 13.0–17.0)
MCH: 31.1 pg (ref 26.0–34.0)
MCHC: 31.9 g/dL (ref 30.0–36.0)
MCV: 97.4 fL (ref 80.0–100.0)
Platelets: 193 10*3/uL (ref 150–400)
RBC: 4.25 MIL/uL (ref 4.22–5.81)
RDW: 15.5 % (ref 11.5–15.5)
WBC: 6.7 10*3/uL (ref 4.0–10.5)
nRBC: 0 % (ref 0.0–0.2)

## 2024-04-05 MED ORDER — PREDNISONE 10 MG (21) PO TBPK: ORAL_TABLET | ORAL | 0 refills | Status: AC

## 2024-04-05 MED ORDER — OXYCODONE-ACETAMINOPHEN 5-325 MG PO TABS: 1.0000 | ORAL_TABLET | ORAL | 0 refills | Status: AC | PRN

## 2024-04-05 MED ORDER — ACYCLOVIR 800 MG PO TABS: 800.0000 mg | ORAL_TABLET | Freq: Every day | ORAL | 0 refills | Status: AC

## 2024-04-05 MED ORDER — FLUORESCEIN SODIUM 1 MG OP STRP
1.0000 | ORAL_STRIP | Freq: Once | OPHTHALMIC | Status: AC
  Administered 2024-04-05: 1 via OPHTHALMIC
  Filled 2024-04-05: qty 1

## 2024-04-05 MED ORDER — MORPHINE SULFATE (PF) 4 MG/ML IV SOLN
4.0000 mg | Freq: Once | INTRAVENOUS | Status: AC
  Administered 2024-04-05: 4 mg via INTRAMUSCULAR
  Filled 2024-04-05: qty 1

## 2024-04-05 MED ORDER — TETRACAINE HCL 0.5 % OP SOLN
1.0000 [drp] | Freq: Once | OPHTHALMIC | Status: AC
  Administered 2024-04-05: 1 [drp] via OPHTHALMIC
  Filled 2024-04-05: qty 4

## 2024-04-05 NOTE — ED Notes (Signed)
 Patient assisted to use the toilet. Patient ambulatory with steady gait using walker. Patient returned to bed without complication.

## 2024-04-05 NOTE — ED Notes (Signed)
 Pt c/o increasingly worsening headache for the past 2 weeks, pt had a fall and was evaluated in the ED and dx with intraparenchymal hemorrhage. Pt c/o L sided worsening headache, nausea without vomiting, L eye vision changes. Headache has been consistent since the fall but vision changes started a couple days ago.

## 2024-04-05 NOTE — ED Provider Notes (Signed)
 "  [facility] Provider Note    Event Date/Time   First MD Initiated Contact with Patient 04/05/24 1503     (approximate)   History   Headache   HPI  Ernest Sims is a 69 y.o. male  who presents to the emergency department today because of concern for headache. The patient had a fall one week ago, had a small ICH and was able to be discharged from the ED at that time. Since then he noticed a rash that started on the left side of his head and scalp. It has started to scab over. He did not notice any drainage. However the pain has continued there. He also noticed some eye redness when it started and some blurry vision although he feels that those symptoms have been improving. The patient denies any fevers. No chills.        Physical Exam   Triage Vital Signs: ED Triage Vitals [04/05/24 1148]  Encounter Vitals Group     BP (!) 141/86     Girls Systolic BP Percentile      Girls Diastolic BP Percentile      Boys Systolic BP Percentile      Boys Diastolic BP Percentile      Pulse Rate 67     Resp 18     Temp 97.9 F (36.6 C)     Temp src      SpO2 98 %     Weight 238 lb (108 kg)     Height 5' 8 (1.727 m)     Head Circumference      Peak Flow      Pain Score 10     Pain Loc      Pain Education      Exclude from Growth Chart     Most recent vital signs: Vitals:   04/05/24 1148  BP: (!) 141/86  Pulse: 67  Resp: 18  Temp: 97.9 F (36.6 C)  SpO2: 98%   General: Awake, alert, oriented. CV:  Good peripheral perfusion. Regular rate and rhythm. Resp:  Normal effort. Lungs clear. Abd:  No distention.  Skin:  Scabbed herpes type rash to left side of scalp.  Eyes:  Slight redness to conjunctiva of left eye. No abnormal fluorescein  uptake. No periocular lesions  ED Results / Procedures / Treatments   Labs (all labs ordered are listed, but only abnormal results are displayed) Labs Reviewed  CBC  BASIC METABOLIC PANEL WITH GFR      EKG  None   RADIOLOGY I independently interpreted and visualized the CT head. My interpretation: No acute bleed Radiology interpretation:  IMPRESSION:  1. Evolving parenchymal hemorrhage in the inferior right frontal lobe with  unchanged mild surrounding edema.  2. No evidence of a new intracranial abnormality.      PROCEDURES:  Critical Care performed: No   MEDICATIONS ORDERED IN ED: Medications - No data to display   IMPRESSION / MDM / ASSESSMENT AND PLAN / ED COURSE  I reviewed the triage vital signs and the nursing notes.                              Differential diagnosis includes, but is not limited to, ICH, fracture, shingles  Patient's presentation is most consistent with acute presentation with potential threat to life or bodily function.   Patient presented to the urgency department today because concerns for worsening headache.  Patient  had been seen a week ago with a small intracranial hemorrhage.  CT scan today does show expected evolving bleed.  No new or acute bleed.  Did however have rash consistent with shingles to left side of his scalp.  His scalp is extremely tender to palpation.  I do think this is what is causing the patient's discomfort.  I discussed this with the patient.  Did not see any abnormal fluorescein  uptake in his eye.  While the patient did state that initially he had some blurry vision and worsening redness of the eye he actually states that his eye symptoms are improving.  Will plan on discharging patient with pain control and steroids.  Did advise patient to call his ophthalmologist tomorrow, however at this time given normal stain and improving symptoms I do not feel emergent ophthalmology evaluation is necessary.      FINAL CLINICAL IMPRESSION(S) / ED DIAGNOSES   Final diagnoses:  Herpes zoster without complication    Note:  This document was prepared using Dragon voice recognition software and may include unintentional  dictation errors.    Floy Roberts, MD 04/05/24 1756  "

## 2024-04-05 NOTE — ED Triage Notes (Signed)
 See first nurse note.  Pt reports continued, possibly worsening h/a since 1/28 after fall. Reports increase nausea and balance issues. Concerned for rash on left side of head and possible shingles. Alert and oriented, clear speech  Reports pain meds not helping with pain

## 2024-04-05 NOTE — ED Triage Notes (Signed)
 FIRST RN: Pt from [facility], pt had intraparenchymal hemorrhage on 1/28, pt went to PCP c/o Lt side headache, nausea and blurred vision. Was scheduled for repeat CT on 2/16 but sent for repeat today d/t worsening sx's.

## 2024-04-06 ENCOUNTER — Ambulatory Visit

## 2024-04-06 DIAGNOSIS — B028 Zoster with other complications: Secondary | ICD-10-CM | POA: Diagnosis not present

## 2024-04-06 DIAGNOSIS — L570 Actinic keratosis: Secondary | ICD-10-CM

## 2024-04-06 DIAGNOSIS — R21 Rash and other nonspecific skin eruption: Secondary | ICD-10-CM | POA: Diagnosis not present

## 2024-04-06 DIAGNOSIS — W908XXA Exposure to other nonionizing radiation, initial encounter: Secondary | ICD-10-CM | POA: Diagnosis not present

## 2024-04-06 DIAGNOSIS — L820 Inflamed seborrheic keratosis: Secondary | ICD-10-CM | POA: Diagnosis not present

## 2024-04-06 DIAGNOSIS — L578 Other skin changes due to chronic exposure to nonionizing radiation: Secondary | ICD-10-CM

## 2024-04-06 NOTE — Patient Instructions (Signed)

## 2024-04-06 NOTE — Progress Notes (Signed)
 "   Subjective   Ernest Sims is a 69 y.o. male who presents for the following: Lesion(s) of concern . Patient is new patient  Today patient reports: LOC on head and face  Also with crusted spots on L side of scalp and face. Endorsing eye blurring and irritation. Also with headache. Was eval in ED yesterday with c/f shingles. Was not swabbed. Started on acyclovir  and prednisone . Still with sx. Did not see eye doctor   Review of Systems:    No other skin or systemic complaints except as noted in HPI or Assessment and Plan.  The following portions of the chart were reviewed this encounter and updated as appropriate: medications, allergies, medical history  Relevant Medical History:  n/a   Objective  (SKPE) Well appearing patient in no apparent distress; mood and affect are within normal limits. Examination was performed of the: Focused Exam of: scalp and face, left forearm   Examination notable for: Seborrheic Keratosis(es): Stuck-on appearing keratotic papule(s) on the trunk, some  irritated with redness, crusting, edema, and/or partial avulsion, Actinic Damage/Elastosis: chronic sun damage: dyspigmentation, telangiectasia, and wrinkling, Actinic keratosis: Scaly erythematous macule(s) concentrated on sun exposed areas   Crusted papules of L scalp extending to forehead  Examination limited by: Clothing and Patient deferred removal     left temple Stuck on waxy paps with erythema Left Forearm Pink scaly macules  Assessment & Plan  (SKAP)   Herpes zoster of V1 Dermatome with associated eye irritation, headaches - Was eval in ED yesterday. CT obtainted and reviewed. Discharged on PO acyclovir  and prednisone . Continues to have eye irritation, headache.  - Lesion swabbed today - Recommended seeing Ophtho - called [facility] who will see him today  - Recommended continuing acyclovir  and prednisone    Seborrheic keratosis, inflamed - Discussed diagnosis, typical course, and  treatment options for this condition - Reassurance, benign, monitor - ABCDE's discussed - Given irritation and symptoms, will proceed with cryotherapy as below  ACTINIC DAMAGE - Chronic condition, secondary to cumulative UV/sun exposure - Recommend daily broad spectrum sunscreen SPF 30+ to sun-exposed areas, reapply every 2 hours as needed.  - Staying in the shade or wearing long sleeves, sun glasses (UVA+UVB protection) and wide brim hats (4-inch brim around the entire circumference of the hat) are also recommended for sun protection.  - Call for new or changing lesions.   Was sun protection counseling provided?: Yes   Level of service outlined above   Patient instructions (SKPI)   Procedures, orders, diagnosis for this visit:  INFLAMED SEBORRHEIC KERATOSIS left temple Symptomatic, irritating, patient would like treated. - Destruction of lesion - left temple Complexity: simple   Destruction method: cryotherapy   Informed consent: discussed and consent obtained   Timeout:  patient name, date of birth, surgical site, and procedure verified Lesion destroyed using liquid nitrogen: Yes   Region frozen until ice ball extended beyond lesion: Yes   Cryo cycles: 1 or 2. Outcome: patient tolerated procedure well with no complications   Post-procedure details: wound care instructions given    ACTINIC KERATOSIS Left Forearm Actinic keratoses are precancerous spots that appear secondary to cumulative UV radiation exposure/sun exposure over time. They are chronic with expected duration over 1 year. A portion of actinic keratoses will progress to squamous cell carcinoma of the skin. It is not possible to reliably predict which spots will progress to skin cancer and so treatment is recommended to prevent development of skin cancer.  Recommend daily broad  spectrum sunscreen SPF 30+ to sun-exposed areas, reapply every 2 hours as needed.  Recommend staying in the shade or wearing long sleeves,  sun glasses (UVA+UVB protection) and wide brim hats (4-inch brim around the entire circumference of the hat). Call for new or changing lesions. - Destruction of lesion - Left Forearm Complexity: simple   Destruction method: cryotherapy   Informed consent: discussed and consent obtained   Timeout:  patient name, date of birth, surgical site, and procedure verified Lesion destroyed using liquid nitrogen: Yes   Region frozen until ice ball extended beyond lesion: Yes   Cryo cycles: 1 or 2. Outcome: patient tolerated procedure well with no complications   Post-procedure details: wound care instructions given    RASH AND OTHER NONSPECIFIC SKIN ERUPTION   This Visit - HSV and VZV PCR Panel  Inflamed seborrheic keratosis -     Destruction of lesion  Actinic keratosis -     Destruction of lesion  Rash and other nonspecific skin eruption -     HSV and VZV PCR Panel    Return to clinic: Return in about 3 months (around 07/04/2024) for TBSE.  I, Emerick Ege, CMA am acting as scribe for Lauraine JAYSON Kanaris, MD.   Documentation: I have reviewed the above documentation for accuracy and completeness, and I agree with the above.  Lauraine JAYSON Kanaris, MD  "

## 2024-04-17 ENCOUNTER — Ambulatory Visit

## 2024-04-20 ENCOUNTER — Telehealth

## 2024-07-05 ENCOUNTER — Ambulatory Visit
